# Patient Record
Sex: Female | Born: 1950 | Race: White | Hispanic: No | Marital: Married | Smoking: Never smoker
Health system: Southern US, Community
[De-identification: ages and names within clinical notes are randomized; demographics above are authoritative.]

## PROBLEM LIST (undated history)

## (undated) DIAGNOSIS — C50919 Malignant neoplasm of unspecified site of unspecified female breast: Secondary | ICD-10-CM

## (undated) DIAGNOSIS — M8580 Other specified disorders of bone density and structure, unspecified site: Secondary | ICD-10-CM

## (undated) DIAGNOSIS — E559 Vitamin D deficiency, unspecified: Secondary | ICD-10-CM

## (undated) DIAGNOSIS — Z0000 Encounter for general adult medical examination without abnormal findings: Principal | ICD-10-CM

## (undated) HISTORY — PX: APPENDECTOMY: SHX54

## (undated) HISTORY — DX: Malignant neoplasm of unspecified site of unspecified female breast: C50.919

## (undated) HISTORY — DX: Other specified disorders of bone density and structure, unspecified site: M85.80

## (undated) HISTORY — DX: Vitamin D deficiency, unspecified: E55.9

## (undated) HISTORY — DX: Encounter for general adult medical examination without abnormal findings: Z00.00

---

## 1998-09-04 DIAGNOSIS — C50919 Malignant neoplasm of unspecified site of unspecified female breast: Secondary | ICD-10-CM

## 1998-09-04 HISTORY — PX: BREAST SURGERY: SHX581

## 1998-09-04 HISTORY — DX: Malignant neoplasm of unspecified site of unspecified female breast: C50.919

## 1998-09-04 HISTORY — PX: BREAST BIOPSY: SHX20

## 1998-09-04 HISTORY — PX: BREAST LUMPECTOMY: SHX2

## 1998-09-04 HISTORY — PX: LYMPH NODE DISSECTION: SHX5087

## 1999-01-18 ENCOUNTER — Other Ambulatory Visit: Admission: RE | Admit: 1999-01-18 | Discharge: 1999-01-18 | Payer: Self-pay | Admitting: Radiology

## 1999-04-15 ENCOUNTER — Encounter: Admission: RE | Admit: 1999-04-15 | Discharge: 1999-07-14 | Payer: Self-pay | Admitting: *Deleted

## 1999-07-19 ENCOUNTER — Other Ambulatory Visit: Admission: RE | Admit: 1999-07-19 | Discharge: 1999-07-19 | Payer: Self-pay | Admitting: Obstetrics and Gynecology

## 1999-09-05 HISTORY — PX: ABDOMINAL HYSTERECTOMY: SHX81

## 2000-01-17 ENCOUNTER — Other Ambulatory Visit: Admission: RE | Admit: 2000-01-17 | Discharge: 2000-01-17 | Payer: Self-pay | Admitting: Obstetrics and Gynecology

## 2000-06-18 ENCOUNTER — Observation Stay (HOSPITAL_COMMUNITY): Admission: RE | Admit: 2000-06-18 | Discharge: 2000-06-18 | Payer: Self-pay | Admitting: *Deleted

## 2000-06-18 ENCOUNTER — Encounter (INDEPENDENT_AMBULATORY_CARE_PROVIDER_SITE_OTHER): Payer: Self-pay

## 2001-03-21 ENCOUNTER — Other Ambulatory Visit: Admission: RE | Admit: 2001-03-21 | Discharge: 2001-03-21 | Payer: Self-pay | Admitting: *Deleted

## 2002-03-24 ENCOUNTER — Other Ambulatory Visit: Admission: RE | Admit: 2002-03-24 | Discharge: 2002-03-24 | Payer: Self-pay | Admitting: *Deleted

## 2003-03-26 ENCOUNTER — Other Ambulatory Visit: Admission: RE | Admit: 2003-03-26 | Discharge: 2003-03-26 | Payer: Self-pay | Admitting: *Deleted

## 2003-06-12 ENCOUNTER — Ambulatory Visit (HOSPITAL_COMMUNITY): Admission: RE | Admit: 2003-06-12 | Discharge: 2003-06-12 | Payer: Self-pay | Admitting: Gastroenterology

## 2004-07-10 ENCOUNTER — Ambulatory Visit: Payer: Self-pay | Admitting: Oncology

## 2005-02-03 ENCOUNTER — Ambulatory Visit: Payer: Self-pay | Admitting: Oncology

## 2005-05-03 ENCOUNTER — Encounter: Admission: RE | Admit: 2005-05-03 | Discharge: 2005-05-03 | Payer: Self-pay | Admitting: Family Medicine

## 2005-09-08 ENCOUNTER — Ambulatory Visit: Payer: Self-pay | Admitting: Oncology

## 2006-09-05 ENCOUNTER — Ambulatory Visit: Payer: Self-pay | Admitting: Oncology

## 2007-09-04 ENCOUNTER — Ambulatory Visit: Payer: Self-pay | Admitting: Oncology

## 2008-09-03 ENCOUNTER — Ambulatory Visit: Payer: Self-pay | Admitting: Oncology

## 2009-03-11 ENCOUNTER — Ambulatory Visit: Payer: Self-pay | Admitting: Oncology

## 2010-02-11 ENCOUNTER — Ambulatory Visit: Payer: Self-pay | Admitting: Oncology

## 2011-01-20 NOTE — Op Note (Signed)
[name], [name]                        ACCOUNT NO.:  [id number]   MEDICAL RECORD NO.:  [id number]                   PATIENT TYPE:  AMB   LOCATION:  ENDO                                 FACILITY:  [facility]   PHYSICIAN:  Petra Kuba, M.D.                 DATE OF BIRTH:  July 28, 1951   DATE OF PROCEDURE:  06/12/2003  DATE OF DISCHARGE:                                 OPERATIVE REPORT   PROCEDURE:  Colonoscopy.   INDICATIONS:  Patient with breast cancer at a young age, due for colonic  screening.  Consent was signed after risks, benefits, methods, and options  thoroughly discussed in the office.   MEDICINES USED:  Demerol 100 mg, Versed 10 mg.   DESCRIPTION OF PROCEDURE:  Rectal inspection was pertinent for external  hemorrhoids.  Digital exam was negative.  The video pediatric adjustable  colonoscope was inserted fairly easily despite a very tortuous colon and  advanced to the cecum.  This did require abdominal pressure and no position  changes.  No obvious abnormality was seen on insertion, and the cecum was  identified by the appendiceal orifice and the ileocecal valve. In fact, the  scope was inserted a short way into the terminal ileum, which was normal.  Photo documentation was obtained.  The scope was slowly withdrawn.  The prep  was adequate.  There was some liquid stool that required washing and  suctioning.  On slow withdrawal back to the rectum no polypoid lesions,  masses, diverticula, or other abnormalities were seen.  When we fell back  around a tortuous loop, we did try to readvanced around the curve to  decrease chances of missing things, but none were seen.  Anorectal pull-  through and retroflexion confirmed some small hemorrhoids.  The scope was  inserted a short way up the left side of the colon, air was suctioned, and  the scope removed.  The patient tolerated the procedure well.  There was no  obvious immediate complication.   ENDOSCOPIC DIAGNOSES:  1.  Internal-external hemorrhoids.  2. Tortuous colon.  3. Otherwise within normal limits to the terminal ileum.   PLAN:  Yearly rectals and guaiacs per gynecology.  Happy to see back p.r.n.  Repeat screening in five to 10 years.  Consider a one-time x-ray, either a  barium enema or virtual colonoscopy, at that juncture if available, or happy  to repeat colonoscopy.                                               Petra Kuba, M.D.    MEM/MEDQ  D:  06/12/2003  T:  06/13/2003  Job:  [id number]   cc:   Pershing Cox, M.D.  [address]  [address]  [place]  [place]  [address]  Fax: [phone]

## 2011-02-28 ENCOUNTER — Encounter (HOSPITAL_BASED_OUTPATIENT_CLINIC_OR_DEPARTMENT_OTHER): Payer: Managed Care, Other (non HMO) | Admitting: Oncology

## 2011-02-28 DIAGNOSIS — Z853 Personal history of malignant neoplasm of breast: Secondary | ICD-10-CM

## 2011-10-10 ENCOUNTER — Telehealth: Payer: Self-pay | Admitting: Oncology

## 2011-10-10 NOTE — Telephone Encounter (Signed)
lmonvm adviisng the pt of her June 2013 appt along with the mammo appt [facility]

## 2012-02-27 ENCOUNTER — Telehealth: Payer: Self-pay | Admitting: Oncology

## 2012-02-27 ENCOUNTER — Ambulatory Visit (HOSPITAL_BASED_OUTPATIENT_CLINIC_OR_DEPARTMENT_OTHER): Payer: Managed Care, Other (non HMO) | Admitting: Oncology

## 2012-02-27 VITALS — BP 114/67 | HR 86 | Temp 97.6°F | Ht 66.0 in | Wt 138.8 lb

## 2012-02-27 DIAGNOSIS — Z853 Personal history of malignant neoplasm of breast: Secondary | ICD-10-CM

## 2012-02-27 DIAGNOSIS — C50919 Malignant neoplasm of unspecified site of unspecified female breast: Secondary | ICD-10-CM

## 2012-02-27 DIAGNOSIS — M899 Disorder of bone, unspecified: Secondary | ICD-10-CM

## 2012-02-27 DIAGNOSIS — M949 Disorder of cartilage, unspecified: Secondary | ICD-10-CM

## 2012-02-27 NOTE — Progress Notes (Signed)
   [facility]    OFFICE PROGRESS NOTE   INTERVAL HISTORY:   She returns as scheduled. No complaint.   A bilateral mammogram was negative on 02/05/2012.  Objective:  Vital signs in last 24 hours:  Blood pressure 114/67, pulse 86, temperature 97.6 F (36.4 C), temperature source Oral, height 5\' 6"  (1.676 m), weight 138 lb 12.8 oz (62.959 kg).    HEENT: Neck without mass Lymphatics: No cervical, supraclavicular, or axillary nodes Resp: Lungs clear bilaterally Cardio: Regular rate and rhythm GI: No hepatomegaly Vascular: No leg edema Breasts: Status post right lumpectomy. No evidence for local tumor recurrence. No mass in either breast the     Medications: I have reviewed the patient's current medications.  Assessment/Plan: 1. 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 2. Osteopenia -not taking calcium or vitamin D. She reports drinking milk and exercising.   Disposition:  She remains in remission from breast cancer. She would like to continue followup at the cancer Center. She will return for an office visit in one year.  Thornton Papas, MD  02/27/2012  3:15 PM

## 2012-02-27 NOTE — Telephone Encounter (Signed)
Gave pt appt for June 2013 MD only,pt will have Bone density @ [facility] on 02/06/13 with mammogram, faxed order to [facility]

## 2013-02-25 ENCOUNTER — Telehealth: Payer: Self-pay | Admitting: Oncology

## 2013-02-25 ENCOUNTER — Ambulatory Visit (HOSPITAL_BASED_OUTPATIENT_CLINIC_OR_DEPARTMENT_OTHER): Payer: BC Managed Care – PPO | Admitting: Oncology

## 2013-02-25 VITALS — BP 101/60 | HR 74 | Temp 98.2°F | Resp 20 | Ht 66.0 in | Wt 138.0 lb

## 2013-02-25 DIAGNOSIS — C50919 Malignant neoplasm of unspecified site of unspecified female breast: Secondary | ICD-10-CM

## 2013-02-25 NOTE — Progress Notes (Signed)
   [facility]    OFFICE PROGRESS NOTE   INTERVAL HISTORY:   She returns as scheduled. She is fatigued after returning from [place] yesterday. No specific complaint. She had a mammogram earlier this month (we have not received the report).  Objective:  Vital signs in last 24 hours:  Blood pressure 101/60, pulse 74, temperature 98.2 F (36.8 C), temperature source Oral, resp. rate 20, height 5\' 6"  (1.676 m), weight 138 lb (62.596 kg).    HEENT: Neck without mass Lymphatics: No cervical, supraclavicular, or axillary nodes Resp: Lungs clear bilaterally Cardio: Regular rate and rhythm GI: No hepatomegaly Vascular: No leg edema Breast: Status post right lumpectomy. No evidence for local tumor recurrence. No mass in either breast.    Medications: I have reviewed the patient's current medications.  Assessment/Plan: 1. 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 2. Osteopenia -not taking calcium or vitamin D. She reports drinking milk and exercising.   Disposition:  She remains in clinical remission from breast cancer. We will followup on the June 2014 mammogram. She will return for an office visit in one year.   Thornton Papas, MD  02/25/2013  4:45 PM

## 2013-03-05 ENCOUNTER — Encounter: Payer: Self-pay | Admitting: Oncology

## 2013-07-10 ENCOUNTER — Other Ambulatory Visit: Payer: Self-pay

## 2013-10-14 ENCOUNTER — Encounter: Payer: Self-pay | Admitting: Oncology

## 2014-02-26 ENCOUNTER — Telehealth: Payer: Self-pay | Admitting: Oncology

## 2014-02-26 ENCOUNTER — Ambulatory Visit (HOSPITAL_BASED_OUTPATIENT_CLINIC_OR_DEPARTMENT_OTHER): Payer: BC Managed Care – PPO | Admitting: Oncology

## 2014-02-26 VITALS — BP 105/67 | HR 79 | Temp 98.4°F | Resp 18 | Ht 66.0 in | Wt 144.5 lb

## 2014-02-26 DIAGNOSIS — C50919 Malignant neoplasm of unspecified site of unspecified female breast: Secondary | ICD-10-CM

## 2014-02-26 DIAGNOSIS — M8580 Other specified disorders of bone density and structure, unspecified site: Secondary | ICD-10-CM

## 2014-02-26 DIAGNOSIS — M859 Disorder of bone density and structure, unspecified: Secondary | ICD-10-CM

## 2014-02-26 DIAGNOSIS — M949 Disorder of cartilage, unspecified: Secondary | ICD-10-CM

## 2014-02-26 DIAGNOSIS — Z853 Personal history of malignant neoplasm of breast: Secondary | ICD-10-CM

## 2014-02-26 DIAGNOSIS — M899 Disorder of bone, unspecified: Secondary | ICD-10-CM

## 2014-02-26 NOTE — Telephone Encounter (Signed)
Gave pt appt for Md for 2016, [employer] they put pty on thier call list for mammogram and Bone density for next year, pt aware

## 2014-02-26 NOTE — Progress Notes (Signed)
  [facility] OFFICE PROGRESS NOTE   Diagnosis: Breast cancer  INTERVAL HISTORY:   She returns as scheduled. She feels well. No palpable change at either breast. A bilateral mammogram 02/09/2014 was negative.  Objective:  Vital signs in last 24 hours:  Blood pressure 105/67, pulse 79, temperature 98.4 F (36.9 C), temperature source Oral, resp. rate 18, height 5\' 6"  (1.676 m), weight 144 lb 8 oz (65.545 kg), SpO2 99.00%.    HEENT: Neck without mass Lymphatics: No cervical, supra-clavicular, or axillary nodes Resp: Lungs clear bilaterally Cardio: Regular rate and rhythm GI: No hepatomegaly Vascular: No leg edema Breasts: Status post right lumpectomy. No evidence for local tumor recurrence. No mass in either breast.   Medications: I have reviewed the patient's current medications.  Assessment/Plan: 1. 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 2. Osteopenia -not taking calcium or vitamin D.   Disposition:  She remains in clinical remission from breast cancer. She would like to continue followup at the [facility]. She will return for an office visit in one year. We will schedule a mammogram and bone density scan for June of 2016.  Betsy Coder, MD  02/26/2014  4:37 PM

## 2014-06-19 ENCOUNTER — Other Ambulatory Visit: Payer: Self-pay

## 2015-02-26 ENCOUNTER — Ambulatory Visit (HOSPITAL_BASED_OUTPATIENT_CLINIC_OR_DEPARTMENT_OTHER): Payer: BLUE CROSS/BLUE SHIELD | Admitting: Oncology

## 2015-02-26 ENCOUNTER — Telehealth: Payer: Self-pay | Admitting: Oncology

## 2015-02-26 VITALS — BP 115/71 | HR 72 | Temp 98.9°F | Resp 18 | Ht 66.0 in | Wt 149.8 lb

## 2015-02-26 DIAGNOSIS — Z853 Personal history of malignant neoplasm of breast: Secondary | ICD-10-CM

## 2015-02-26 DIAGNOSIS — M8580 Other specified disorders of bone density and structure, unspecified site: Secondary | ICD-10-CM | POA: Diagnosis not present

## 2015-02-26 DIAGNOSIS — C50911 Malignant neoplasm of unspecified site of right female breast: Secondary | ICD-10-CM

## 2015-02-26 NOTE — Telephone Encounter (Signed)
per pof to sch pt appt-cld to sch mamma & DEXA-per [facility] books not opened that far out-stated they will send pt a reminder to call and make appt-gave pt copy of avs

## 2015-02-26 NOTE — Progress Notes (Signed)
  [facility] OFFICE PROGRESS NOTE   Diagnosis: Breast cancer  INTERVAL HISTORY:   She returns as scheduled. She feels well. No change over either breast. No complaint. A bone density scan on 02/12/2015 was unchanged compared to June 2014. She is exercising, but does not take calcium.  Objective:  Vital signs in last 24 hours:  Blood pressure 115/71, pulse 72, temperature 98.9 F (37.2 C), temperature source Oral, resp. rate 18, height 5\' 6"  (1.676 m), weight 149 lb 12.8 oz (67.949 kg), SpO2 100 %.    HEENT: Neck without mass Lymphatics: No cervical, supraclavicular, or axillary nodes Resp: Lungs clear bilaterally Cardio: Regular rate and rhythm GI: No hepatomegaly Vascular: No leg edema Breasts: Status post right lumpectomy. No evidence for local tumor recurrence. No mass in either breast. 4-5 millimeter soft mobile oval cutaneous nodule at the medial right breast.     Medications: I have reviewed the patient's current medications.  Assessment/Plan: 1. 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 2. Osteopenia -not taking calcium or vitamin D. Stable bone density scan 02/12/2015    Disposition:  [name] remains in clinical remission from breast cancer. She would like to continue follow-up at the [facility]. She will be scheduled for a mammogram in June 2017. She will return for an office visit in one year.  Betsy Coder, MD  02/26/2015  10:43 AM

## 2015-09-22 ENCOUNTER — Telehealth: Payer: Self-pay | Admitting: Oncology

## 2015-09-22 NOTE — Telephone Encounter (Signed)
pt cld to r/s appt to [facility]-gave pt time & date of r/s appt

## 2015-11-09 ENCOUNTER — Encounter: Payer: Self-pay | Admitting: Family Medicine

## 2015-11-09 ENCOUNTER — Ambulatory Visit (INDEPENDENT_AMBULATORY_CARE_PROVIDER_SITE_OTHER): Payer: 59 | Admitting: Family Medicine

## 2015-11-09 ENCOUNTER — Ambulatory Visit: Payer: BLUE CROSS/BLUE SHIELD | Admitting: Family Medicine

## 2015-11-09 VITALS — BP 120/76 | HR 84 | Temp 97.9°F | Ht 66.0 in | Wt 151.1 lb

## 2015-11-09 DIAGNOSIS — C50911 Malignant neoplasm of unspecified site of right female breast: Secondary | ICD-10-CM

## 2015-11-09 DIAGNOSIS — E559 Vitamin D deficiency, unspecified: Secondary | ICD-10-CM

## 2015-11-09 DIAGNOSIS — M8580 Other specified disorders of bone density and structure, unspecified site: Secondary | ICD-10-CM | POA: Diagnosis not present

## 2015-11-09 LAB — VITAMIN D 25 HYDROXY (VIT D DEFICIENCY, FRACTURES): VITD: 26.61 ng/mL — ABNORMAL LOW (ref 30.00–100.00)

## 2015-11-09 NOTE — Progress Notes (Signed)
Pre visit review using our clinic review tool, if applicable. No additional management support is needed unless otherwise documented below in the visit note. 

## 2015-11-09 NOTE — Patient Instructions (Signed)
Preventive Care for Adults, Female A healthy lifestyle and preventive care can promote health and wellness. Preventive health guidelines for women include the following key practices.  A routine yearly physical is a good way to check with your health care provider about your health and preventive screening. It is a chance to share any concerns and updates on your health and to receive a thorough exam.  Visit your dentist for a routine exam and preventive care every 6 months. Brush your teeth twice a day and floss once a day. Good oral hygiene prevents tooth decay and gum disease.  The frequency of eye exams is based on your age, health, family medical history, use of contact lenses, and other factors. Follow your health care provider's recommendations for frequency of eye exams.  Eat a healthy diet. Foods like vegetables, fruits, whole grains, low-fat dairy products, and lean protein foods contain the nutrients you need without too many calories. Decrease your intake of foods high in solid fats, added sugars, and salt. Eat the right amount of calories for you.Get information about a proper diet from your health care provider, if necessary.  Regular physical exercise is one of the most important things you can do for your health. Most adults should get at least 150 minutes of moderate-intensity exercise (any activity that increases your heart rate and causes you to sweat) each week. In addition, most adults need muscle-strengthening exercises on 2 or more days a week.  Maintain a healthy weight. The body mass index (BMI) is a screening tool to identify possible weight problems. It provides an estimate of body fat based on height and weight. Your health care provider can find your BMI and can help you achieve or maintain a healthy weight.For adults 20 years and older:  A BMI below 18.5 is considered underweight.  A BMI of 18.5 to 24.9 is normal.  A BMI of 25 to 29.9 is considered overweight.  A  BMI of 30 and above is considered obese.  Maintain normal blood lipids and cholesterol levels by exercising and minimizing your intake of saturated fat. Eat a balanced diet with plenty of fruit and vegetables. Blood tests for lipids and cholesterol should begin at age 45 and be repeated every 5 years. If your lipid or cholesterol levels are high, you are over 50, or you are at high risk for heart disease, you may need your cholesterol levels checked more frequently.Ongoing high lipid and cholesterol levels should be treated with medicines if diet and exercise are not working.  If you smoke, find out from your health care provider how to quit. If you do not use tobacco, do not start.  Lung cancer screening is recommended for adults aged 45-80 years who are at high risk for developing lung cancer because of a history of smoking. A yearly low-dose CT scan of the lungs is recommended for people who have at least a 30-pack-year history of smoking and are a current smoker or have quit within the past 15 years. A pack year of smoking is smoking an average of 1 pack of cigarettes a day for 1 year (for example: 1 pack a day for 30 years or 2 packs a day for 15 years). Yearly screening should continue until the smoker has stopped smoking for at least 15 years. Yearly screening should be stopped for people who develop a health problem that would prevent them from having lung cancer treatment.  If you are pregnant, do not drink alcohol. If you are  breastfeeding, be very cautious about drinking alcohol. If you are not pregnant and choose to drink alcohol, do not have more than 1 drink per day. One drink is considered to be 12 ounces (355 mL) of beer, 5 ounces (148 mL) of wine, or 1.5 ounces (44 mL) of liquor.  Avoid use of street drugs. Do not share needles with anyone. Ask for help if you need support or instructions about stopping the use of drugs.  High blood pressure causes heart disease and increases the risk  of stroke. Your blood pressure should be checked at least every 1 to 2 years. Ongoing high blood pressure should be treated with medicines if weight loss and exercise do not work.  If you are 55-79 years old, ask your health care provider if you should take aspirin to prevent strokes.  Diabetes screening is done by taking a blood sample to check your blood glucose level after you have not eaten for a certain period of time (fasting). If you are not overweight and you do not have risk factors for diabetes, you should be screened once every 3 years starting at age 45. If you are overweight or obese and you are 40-70 years of age, you should be screened for diabetes every year as part of your cardiovascular risk assessment.  Breast cancer screening is essential preventive care for women. You should practice "breast self-awareness." This means understanding the normal appearance and feel of your breasts and may include breast self-examination. Any changes detected, no matter how small, should be reported to a health care provider. Women in their 20s and 30s should have a clinical breast exam (CBE) by a health care provider as part of a regular health exam every 1 to 3 years. After age 40, women should have a CBE every year. Starting at age 40, women should consider having a mammogram (breast X-ray test) every year. Women who have a family history of breast cancer should talk to their health care provider about genetic screening. Women at a high risk of breast cancer should talk to their health care providers about having an MRI and a mammogram every year.  Breast cancer gene (BRCA)-related cancer risk assessment is recommended for women who have family members with BRCA-related cancers. BRCA-related cancers include breast, ovarian, tubal, and peritoneal cancers. Having family members with these cancers may be associated with an increased risk for harmful changes (mutations) in the breast cancer genes BRCA1 and  BRCA2. Results of the assessment will determine the need for genetic counseling and BRCA1 and BRCA2 testing.  Your health care provider may recommend that you be screened regularly for cancer of the pelvic organs (ovaries, uterus, and vagina). This screening involves a pelvic examination, including checking for microscopic changes to the surface of your cervix (Pap test). You may be encouraged to have this screening done every 3 years, beginning at age 21.  For women ages 30-65, health care providers may recommend pelvic exams and Pap testing every 3 years, or they may recommend the Pap and pelvic exam, combined with testing for human papilloma virus (HPV), every 5 years. Some types of HPV increase your risk of cervical cancer. Testing for HPV may also be done on women of any age with unclear Pap test results.  Other health care providers may not recommend any screening for nonpregnant women who are considered low risk for pelvic cancer and who do not have symptoms. Ask your health care provider if a screening pelvic exam is right for   you.  If you have had past treatment for cervical cancer or a condition that could lead to cancer, you need Pap tests and screening for cancer for at least 20 years after your treatment. If Pap tests have been discontinued, your risk factors (such as having a new sexual partner) need to be reassessed to determine if screening should resume. Some women have medical problems that increase the chance of getting cervical cancer. In these cases, your health care provider may recommend more frequent screening and Pap tests.  Colorectal cancer can be detected and often prevented. Most routine colorectal cancer screening begins at the age of 50 years and continues through age 75 years. However, your health care provider may recommend screening at an earlier age if you have risk factors for colon cancer. On a yearly basis, your health care provider may provide home test kits to check  for hidden blood in the stool. Use of a small camera at the end of a tube, to directly examine the colon (sigmoidoscopy or colonoscopy), can detect the earliest forms of colorectal cancer. Talk to your health care provider about this at age 50, when routine screening begins. Direct exam of the colon should be repeated every 5-10 years through age 75 years, unless early forms of precancerous polyps or small growths are found.  People who are at an increased risk for hepatitis B should be screened for this virus. You are considered at high risk for hepatitis B if:  You were born in a country where hepatitis B occurs often. Talk with your health care provider about which countries are considered high risk.  Your parents were born in a high-risk country and you have not received a shot to protect against hepatitis B (hepatitis B vaccine).  You have HIV or AIDS.  You use needles to inject street drugs.  You live with, or have sex with, someone who has hepatitis B.  You get hemodialysis treatment.  You take certain medicines for conditions like cancer, organ transplantation, and autoimmune conditions.  Hepatitis C blood testing is recommended for all people born from 1945 through 1965 and any individual with known risks for hepatitis C.  Practice safe sex. Use condoms and avoid high-risk sexual practices to reduce the spread of sexually transmitted infections (STIs). STIs include gonorrhea, chlamydia, syphilis, trichomonas, herpes, HPV, and human immunodeficiency virus (HIV). Herpes, HIV, and HPV are viral illnesses that have no cure. They can result in disability, cancer, and death.  You should be screened for sexually transmitted illnesses (STIs) including gonorrhea and chlamydia if:  You are sexually active and are younger than 24 years.  You are older than 24 years and your health care provider tells you that you are at risk for this type of infection.  Your sexual activity has changed  since you were last screened and you are at an increased risk for chlamydia or gonorrhea. Ask your health care provider if you are at risk.  If you are at risk of being infected with HIV, it is recommended that you take a prescription medicine daily to prevent HIV infection. This is called preexposure prophylaxis (PrEP). You are considered at risk if:  You are sexually active and do not regularly use condoms or know the HIV status of your partner(s).  You take drugs by injection.  You are sexually active with a partner who has HIV.  Talk with your health care provider about whether you are at high risk of being infected with HIV. If   you choose to begin PrEP, you should first be tested for HIV. You should then be tested every 3 months for as long as you are taking PrEP.  Osteoporosis is a disease in which the bones lose minerals and strength with aging. This can result in serious bone fractures or breaks. The risk of osteoporosis can be identified using a bone density scan. Women ages 67 years and over and women at risk for fractures or osteoporosis should discuss screening with their health care providers. Ask your health care provider whether you should take a calcium supplement or vitamin D to reduce the rate of osteoporosis.  Menopause can be associated with physical symptoms and risks. Hormone replacement therapy is available to decrease symptoms and risks. You should talk to your health care provider about whether hormone replacement therapy is right for you.  Use sunscreen. Apply sunscreen liberally and repeatedly throughout the day. You should seek shade when your shadow is shorter than you. Protect yourself by wearing long sleeves, pants, a wide-brimmed hat, and sunglasses year round, whenever you are outdoors.  Once a month, do a whole body skin exam, using a mirror to look at the skin on your back. Tell your health care provider of new moles, moles that have irregular borders, moles that  are larger than a pencil eraser, or moles that have changed in shape or color.  Stay current with required vaccines (immunizations).  Influenza vaccine. All adults should be immunized every year.  Tetanus, diphtheria, and acellular pertussis (Td, Tdap) vaccine. Pregnant women should receive 1 dose of Tdap vaccine during each pregnancy. The dose should be obtained regardless of the length of time since the last dose. Immunization is preferred during the 27th-36th week of gestation. An adult who has not previously received Tdap or who does not know her vaccine status should receive 1 dose of Tdap. This initial dose should be followed by tetanus and diphtheria toxoids (Td) booster doses every 10 years. Adults with an unknown or incomplete history of completing a 3-dose immunization series with Td-containing vaccines should begin or complete a primary immunization series including a Tdap dose. Adults should receive a Td booster every 10 years.  Varicella vaccine. An adult without evidence of immunity to varicella should receive 2 doses or a second dose if she has previously received 1 dose. Pregnant females who do not have evidence of immunity should receive the first dose after pregnancy. This first dose should be obtained before leaving the health care facility. The second dose should be obtained 4-8 weeks after the first dose.  Human papillomavirus (HPV) vaccine. Females aged 13-26 years who have not received the vaccine previously should obtain the 3-dose series. The vaccine is not recommended for use in pregnant females. However, pregnancy testing is not needed before receiving a dose. If a female is found to be pregnant after receiving a dose, no treatment is needed. In that case, the remaining doses should be delayed until after the pregnancy. Immunization is recommended for any person with an immunocompromised condition through the age of 61 years if she did not get any or all doses earlier. During the  3-dose series, the second dose should be obtained 4-8 weeks after the first dose. The third dose should be obtained 24 weeks after the first dose and 16 weeks after the second dose.  Zoster vaccine. One dose is recommended for adults aged 30 years or older unless certain conditions are present.  Measles, mumps, and rubella (MMR) vaccine. Adults born  before 1957 generally are considered immune to measles and mumps. Adults born in 1957 or later should have 1 or more doses of MMR vaccine unless there is a contraindication to the vaccine or there is laboratory evidence of immunity to each of the three diseases. A routine second dose of MMR vaccine should be obtained at least 28 days after the first dose for students attending postsecondary schools, health care workers, or international travelers. People who received inactivated measles vaccine or an unknown type of measles vaccine during 1963-1967 should receive 2 doses of MMR vaccine. People who received inactivated mumps vaccine or an unknown type of mumps vaccine before 1979 and are at high risk for mumps infection should consider immunization with 2 doses of MMR vaccine. For females of childbearing age, rubella immunity should be determined. If there is no evidence of immunity, females who are not pregnant should be vaccinated. If there is no evidence of immunity, females who are pregnant should delay immunization until after pregnancy. Unvaccinated health care workers born before 1957 who lack laboratory evidence of measles, mumps, or rubella immunity or laboratory confirmation of disease should consider measles and mumps immunization with 2 doses of MMR vaccine or rubella immunization with 1 dose of MMR vaccine.  Pneumococcal 13-valent conjugate (PCV13) vaccine. When indicated, a person who is uncertain of his immunization history and has no record of immunization should receive the PCV13 vaccine. All adults 65 years of age and older should receive this  vaccine. An adult aged 19 years or older who has certain medical conditions and has not been previously immunized should receive 1 dose of PCV13 vaccine. This PCV13 should be followed with a dose of pneumococcal polysaccharide (PPSV23) vaccine. Adults who are at high risk for pneumococcal disease should obtain the PPSV23 vaccine at least 8 weeks after the dose of PCV13 vaccine. Adults older than 63 years of age who have normal immune system function should obtain the PPSV23 vaccine dose at least 1 year after the dose of PCV13 vaccine.  Pneumococcal polysaccharide (PPSV23) vaccine. When PCV13 is also indicated, PCV13 should be obtained first. All adults aged 65 years and older should be immunized. An adult younger than age 65 years who has certain medical conditions should be immunized. Any person who resides in a nursing home or long-term care facility should be immunized. An adult smoker should be immunized. People with an immunocompromised condition and certain other conditions should receive both PCV13 and PPSV23 vaccines. People with human immunodeficiency virus (HIV) infection should be immunized as soon as possible after diagnosis. Immunization during chemotherapy or radiation therapy should be avoided. Routine use of PPSV23 vaccine is not recommended for American Indians, [place] Natives, or people younger than 65 years unless there are medical conditions that require PPSV23 vaccine. When indicated, people who have unknown immunization and have no record of immunization should receive PPSV23 vaccine. One-time revaccination 5 years after the first dose of PPSV23 is recommended for people aged 19-64 years who have chronic kidney failure, nephrotic syndrome, asplenia, or immunocompromised conditions. People who received 1-2 doses of PPSV23 before age 65 years should receive another dose of PPSV23 vaccine at age 65 years or later if at least 5 years have passed since the previous dose. Doses of PPSV23 are not  needed for people immunized with PPSV23 at or after age 65 years.  Meningococcal vaccine. Adults with asplenia or persistent complement component deficiencies should receive 2 doses of quadrivalent meningococcal conjugate (MenACWY-D) vaccine. The doses should be obtained   at least 2 months apart. Microbiologists working with certain meningococcal bacteria, [place] recruits, people at risk during an outbreak, and people who travel to or live in countries with a high rate of meningitis should be immunized. A first-year college student up through age 34 years who is living in a residence hall should receive a dose if she did not receive a dose on or after her 16th birthday. Adults who have certain high-risk conditions should receive one or more doses of vaccine.  Hepatitis A vaccine. Adults who wish to be protected from this disease, have certain high-risk conditions, work with hepatitis A-infected animals, work in hepatitis A research labs, or travel to or work in countries with a high rate of hepatitis A should be immunized. Adults who were previously unvaccinated and who anticipate close contact with an international adoptee during the first 60 days after arrival in the [place] from a country with a high rate of hepatitis A should be immunized.  Hepatitis B vaccine. Adults who wish to be protected from this disease, have certain high-risk conditions, may be exposed to blood or other infectious body fluids, are household contacts or sex partners of hepatitis B positive people, are clients or workers in certain care facilities, or travel to or work in countries with a high rate of hepatitis B should be immunized.  Haemophilus influenzae type b (Hib) vaccine. A previously unvaccinated person with asplenia or sickle cell disease or having a scheduled splenectomy should receive 1 dose of Hib vaccine. Regardless of previous immunization, a recipient of a hematopoietic stem cell transplant should receive a  3-dose series 6-12 months after her successful transplant. Hib vaccine is not recommended for adults with HIV infection. Preventive Services / Frequency Ages 35 to 4 years  Blood pressure check.** / Every 3-5 years.  Lipid and cholesterol check.** / Every 5 years beginning at age 60.  Clinical breast exam.** / Every 3 years for women in their 71s and 10s.  BRCA-related cancer risk assessment.** / For women who have family members with a BRCA-related cancer (breast, ovarian, tubal, or peritoneal cancers).  Pap test.** / Every 2 years from ages 76 through 26. Every 3 years starting at age 61 through age 76 or 93 with a history of 3 consecutive normal Pap tests.  HPV screening.** / Every 3 years from ages 37 through ages 60 to 51 with a history of 3 consecutive normal Pap tests.  Hepatitis C blood test.** / For any individual with known risks for hepatitis C.  Skin self-exam. / Monthly.  Influenza vaccine. / Every year.  Tetanus, diphtheria, and acellular pertussis (Tdap, Td) vaccine.** / Consult your health care provider. Pregnant women should receive 1 dose of Tdap vaccine during each pregnancy. 1 dose of Td every 10 years.  Varicella vaccine.** / Consult your health care provider. Pregnant females who do not have evidence of immunity should receive the first dose after pregnancy.  HPV vaccine. / 3 doses over 6 months, if 93 and younger. The vaccine is not recommended for use in pregnant females. However, pregnancy testing is not needed before receiving a dose.  Measles, mumps, rubella (MMR) vaccine.** / You need at least 1 dose of MMR if you were born in 1957 or later. You may also need a 2nd dose. For females of childbearing age, rubella immunity should be determined. If there is no evidence of immunity, females who are not pregnant should be vaccinated. If there is no evidence of immunity, females who are  pregnant should delay immunization until after pregnancy.  Pneumococcal  13-valent conjugate (PCV13) vaccine.** / Consult your health care provider.  Pneumococcal polysaccharide (PPSV23) vaccine.** / 1 to 2 doses if you smoke cigarettes or if you have certain conditions.  Meningococcal vaccine.** / 1 dose if you are age 68 to 8 years and a Market researcher living in a residence hall, or have one of several medical conditions, you need to get vaccinated against meningococcal disease. You may also need additional booster doses.  Hepatitis A vaccine.** / Consult your health care provider.  Hepatitis B vaccine.** / Consult your health care provider.  Haemophilus influenzae type b (Hib) vaccine.** / Consult your health care provider. Ages 7 to 53 years  Blood pressure check.** / Every year.  Lipid and cholesterol check.** / Every 5 years beginning at age 25 years.  Lung cancer screening. / Every year if you are aged 11-80 years and have a 30-pack-year history of smoking and currently smoke or have quit within the past 15 years. Yearly screening is stopped once you have quit smoking for at least 15 years or develop a health problem that would prevent you from having lung cancer treatment.  Clinical breast exam.** / Every year after age 48 years.  BRCA-related cancer risk assessment.** / For women who have family members with a BRCA-related cancer (breast, ovarian, tubal, or peritoneal cancers).  Mammogram.** / Every year beginning at age 41 years and continuing for as long as you are in good health. Consult with your health care provider.  Pap test.** / Every 3 years starting at age 65 years through age 37 or 70 years with a history of 3 consecutive normal Pap tests.  HPV screening.** / Every 3 years from ages 72 years through ages 60 to 40 years with a history of 3 consecutive normal Pap tests.  Fecal occult blood test (FOBT) of stool. / Every year beginning at age 21 years and continuing until age 5 years. You may not need to do this test if you get  a colonoscopy every 10 years.  Flexible sigmoidoscopy or colonoscopy.** / Every 5 years for a flexible sigmoidoscopy or every 10 years for a colonoscopy beginning at age 35 years and continuing until age 48 years.  Hepatitis C blood test.** / For all people born from [birth year] through 1965 and any individual with known risks for hepatitis C.  Skin self-exam. / Monthly.  Influenza vaccine. / Every year.  Tetanus, diphtheria, and acellular pertussis (Tdap/Td) vaccine.** / Consult your health care provider. Pregnant women should receive 1 dose of Tdap vaccine during each pregnancy. 1 dose of Td every 10 years.  Varicella vaccine.** / Consult your health care provider. Pregnant females who do not have evidence of immunity should receive the first dose after pregnancy.  Zoster vaccine.** / 1 dose for adults aged 30 years or older.  Measles, mumps, rubella (MMR) vaccine.** / You need at least 1 dose of MMR if you were born in 1957 or later. You may also need a second dose. For females of childbearing age, rubella immunity should be determined. If there is no evidence of immunity, females who are not pregnant should be vaccinated. If there is no evidence of immunity, females who are pregnant should delay immunization until after pregnancy.  Pneumococcal 13-valent conjugate (PCV13) vaccine.** / Consult your health care provider.  Pneumococcal polysaccharide (PPSV23) vaccine.** / 1 to 2 doses if you smoke cigarettes or if you have certain conditions.  Meningococcal vaccine.** /  Consult your health care provider.  Hepatitis A vaccine.** / Consult your health care provider.  Hepatitis B vaccine.** / Consult your health care provider.  Haemophilus influenzae type b (Hib) vaccine.** / Consult your health care provider. Ages 64 years and over  Blood pressure check.** / Every year.  Lipid and cholesterol check.** / Every 5 years beginning at age 23 years.  Lung cancer screening. / Every year if you  are aged 16-80 years and have a 30-pack-year history of smoking and currently smoke or have quit within the past 15 years. Yearly screening is stopped once you have quit smoking for at least 15 years or develop a health problem that would prevent you from having lung cancer treatment.  Clinical breast exam.** / Every year after age 74 years.  BRCA-related cancer risk assessment.** / For women who have family members with a BRCA-related cancer (breast, ovarian, tubal, or peritoneal cancers).  Mammogram.** / Every year beginning at age 44 years and continuing for as long as you are in good health. Consult with your health care provider.  Pap test.** / Every 3 years starting at age 58 years through age 22 or 39 years with 3 consecutive normal Pap tests. Testing can be stopped between 65 and 70 years with 3 consecutive normal Pap tests and no abnormal Pap or HPV tests in the past 10 years.  HPV screening.** / Every 3 years from ages 64 years through ages 70 or 61 years with a history of 3 consecutive normal Pap tests. Testing can be stopped between 65 and 70 years with 3 consecutive normal Pap tests and no abnormal Pap or HPV tests in the past 10 years.  Fecal occult blood test (FOBT) of stool. / Every year beginning at age 40 years and continuing until age 27 years. You may not need to do this test if you get a colonoscopy every 10 years.  Flexible sigmoidoscopy or colonoscopy.** / Every 5 years for a flexible sigmoidoscopy or every 10 years for a colonoscopy beginning at age 7 years and continuing until age 32 years.  Hepatitis C blood test.** / For all people born from [birth year] through 1965 and any individual with known risks for hepatitis C.  Osteoporosis screening.** / A one-time screening for women ages 30 years and over and women at risk for fractures or osteoporosis.  Skin self-exam. / Monthly.  Influenza vaccine. / Every year.  Tetanus, diphtheria, and acellular pertussis (Tdap/Td)  vaccine.** / 1 dose of Td every 10 years.  Varicella vaccine.** / Consult your health care provider.  Zoster vaccine.** / 1 dose for adults aged 35 years or older.  Pneumococcal 13-valent conjugate (PCV13) vaccine.** / Consult your health care provider.  Pneumococcal polysaccharide (PPSV23) vaccine.** / 1 dose for all adults aged 46 years and older.  Meningococcal vaccine.** / Consult your health care provider.  Hepatitis A vaccine.** / Consult your health care provider.  Hepatitis B vaccine.** / Consult your health care provider.  Haemophilus influenzae type b (Hib) vaccine.** / Consult your health care provider. ** Family history and personal history of risk and conditions may change your health care provider's recommendations.   This information is not intended to replace advice given to you by your health care provider. Make sure you discuss any questions you have with your health care provider.   Document Released: 10/17/2001 Document Revised: 09/11/2014 Document Reviewed: 01/16/2011 Elsevier Interactive Patient Education [employer].

## 2015-11-09 NOTE — Progress Notes (Signed)
Patient ID: Susan Chang, female   DOB: [birth year], 65 y.o.   MRN: [id number]   Subjective:    Patient ID: Susan Chang, female    DOB: 04-22-1951, 65 y.o.   MRN: [id number]  Chief Complaint  Patient presents with  . Establish Care    HPI Patient is in today for new patient appointment. No recent illness or acute concerns. Her past medical history includes right sided Breast cancer, osteopenia and vitamin D deficiency. Denies CP/palp/SOB/HA/congestion/fevers/GI or GU c/o. Taking meds as prescribed  Past Medical History  Diagnosis Date  . Osteopenia   . Vitamin D deficiency 11/14/2015  . Breast cancer ([place]) 2000    right    Past Surgical History  Procedure Laterality Date  . Appendectomy    . Breast surgery  2000    cancer, right lumpectomy  . Abdominal hysterectomy  2001    TAH b/l SPO for ovarian cyst,  . Lymph node dissection Right 2000  . Breast biopsy Right 2000    Family History  Problem Relation Age of Onset  . Cancer Mother     Breast cancer  . Hypertension Mother   . Stroke Mother   . Cancer Father     lung cancer  . Cancer Paternal Aunt     breast cancer    Social History   Social History  . Marital Status: Married    Spouse Name: N/A  . Number of Children: N/A  . Years of Education: N/A   Occupational History  . Production manager    Social History Main Topics  . Smoking status: Never Smoker   . Smokeless tobacco: Not on file  . Alcohol Use: 0.0 oz/week    0 Standard drinks or equivalent per week  . Drug Use: No  . Sexual Activity: Yes     Comment: lives with husband, drafting and design, no dietary restrictions   Other Topics Concern  . Not on file   Social History Narrative    No outpatient prescriptions prior to visit.   No facility-administered medications prior to visit.    No Known Allergies  Review of Systems  Constitutional: Negative for fever, chills and malaise/fatigue.  HENT: Negative for congestion and  hearing loss.   Eyes: Negative for discharge.  Respiratory: Negative for cough, sputum production and shortness of breath.   Cardiovascular: Negative for chest pain, palpitations and leg swelling.  Gastrointestinal: Negative for heartburn, nausea, vomiting, abdominal pain, diarrhea, constipation and blood in stool.  Genitourinary: Negative for dysuria, urgency, frequency and hematuria.  Musculoskeletal: Negative for myalgias, back pain and falls.  Skin: Negative for rash.  Neurological: Negative for dizziness, sensory change, loss of consciousness, weakness and headaches.  Endo/Heme/Allergies: Negative for environmental allergies. Does not bruise/bleed easily.  Psychiatric/Behavioral: Negative for depression and suicidal ideas. The patient is not nervous/anxious and does not have insomnia.        Objective:    Physical Exam  Constitutional: She is oriented to person, place, and time. She appears well-developed and well-nourished. No distress.  HENT:  Head: Normocephalic and atraumatic.  Eyes: Conjunctivae are normal.  Neck: Neck supple. No thyromegaly present.  Cardiovascular: Normal rate, regular rhythm and normal heart sounds.   No murmur heard. Pulmonary/Chest: Effort normal and breath sounds normal. No respiratory distress.  Abdominal: Soft. Bowel sounds are normal. She exhibits no distension and no mass. There is no tenderness.  Musculoskeletal: She exhibits no edema.  Lymphadenopathy:    She has no cervical  adenopathy.  Neurological: She is alert and oriented to person, place, and time.  Skin: Skin is warm and dry.  Psychiatric: She has a normal mood and affect. Her behavior is normal.    BP 120/76 mmHg  Pulse 84  Temp(Src) 97.9 F (36.6 C) (Oral)  Ht _0  (1.676 m)  Wt 151 lb 2 oz (68.55 kg)  BMI 24.40 kg/m2  SpO2 97% Wt Readings from Last 3 Encounters:  11/09/15 151 lb 2 oz (68.55 kg)  02/26/15 149 lb 12.8 oz (67.949 kg)  02/26/14 144 lb 8 oz (65.545 kg)      No results found for: WBC, HGB, HCT, PLT, GLUCOSE, CHOL, TRIG, HDL, LDLDIRECT, LDLCALC, ALT, AST, NA, K, CL, CREATININE, BUN, CO2, TSH, PSA, INR, GLUF, HGBA1C, MICROALBUR  No results found for: TSH No results found for: WBC, HGB, HCT, MCV, PLT No results found for: NA, K, CHLORIDE, CO2, GLUCOSE, BUN, CREATININE, BILITOT, ALKPHOS, AST, ALT, PROT, ALBUMIN, CALCIUM, ANIONGAP, EGFR, GFR No results found for: CHOL No results found for: HDL No results found for: LDLCALC No results found for: TRIG No results found for: CHOLHDL No results found for: HGBA1C     Assessment & Plan:   Problem List Items Addressed This Visit    Breast cancer ([place])    Underwent lumpectomy with radiation and then 5 years of Tamoxifen and has done well since then      Osteopenia - Primary    Encouraged to get adequate exercise, calcium and vitamin d intake      Relevant Orders   VITAMIN D 25 Hydroxy (Vit-D Deficiency, Fractures) (Completed)   Vitamin D deficiency      [name] does not currently have medications on file.  No orders of the defined types were placed in this encounter.     Penni Homans, MD

## 2015-11-10 ENCOUNTER — Other Ambulatory Visit: Payer: Self-pay | Admitting: Family Medicine

## 2015-11-10 MED ORDER — VITAMIN D (ERGOCALCIFEROL) 1.25 MG (50000 UNIT) PO CAPS: 50000.0000 [IU] | ORAL_CAPSULE | ORAL | Status: DC

## 2015-11-14 ENCOUNTER — Encounter: Payer: Self-pay | Admitting: Family Medicine

## 2015-11-14 DIAGNOSIS — C50919 Malignant neoplasm of unspecified site of unspecified female breast: Secondary | ICD-10-CM | POA: Insufficient documentation

## 2015-11-14 DIAGNOSIS — E559 Vitamin D deficiency, unspecified: Secondary | ICD-10-CM

## 2015-11-14 HISTORY — DX: Vitamin D deficiency, unspecified: E55.9

## 2015-11-14 NOTE — Assessment & Plan Note (Signed)
Underwent lumpectomy with radiation and then 5 years of Tamoxifen and has done well since then

## 2015-11-14 NOTE — Assessment & Plan Note (Signed)
Encouraged to get adequate exercise, calcium and vitamin d intake 

## 2015-12-21 LAB — HM MAMMOGRAPHY

## 2015-12-23 ENCOUNTER — Encounter: Payer: Self-pay | Admitting: Family Medicine

## 2016-02-01 ENCOUNTER — Telehealth: Payer: Self-pay | Admitting: Oncology

## 2016-02-01 ENCOUNTER — Ambulatory Visit (HOSPITAL_BASED_OUTPATIENT_CLINIC_OR_DEPARTMENT_OTHER): Payer: 59 | Admitting: Oncology

## 2016-02-01 VITALS — BP 120/68 | HR 77 | Temp 98.5°F | Resp 18 | Ht 66.0 in | Wt 149.9 lb

## 2016-02-01 DIAGNOSIS — M8580 Other specified disorders of bone density and structure, unspecified site: Secondary | ICD-10-CM | POA: Diagnosis not present

## 2016-02-01 DIAGNOSIS — Z853 Personal history of malignant neoplasm of breast: Secondary | ICD-10-CM

## 2016-02-01 DIAGNOSIS — C50911 Malignant neoplasm of unspecified site of right female breast: Secondary | ICD-10-CM

## 2016-02-01 NOTE — Progress Notes (Signed)
  [facility] OFFICE PROGRESS NOTE   Diagnosis:  Breast cancer  INTERVAL HISTORY:   She returns as scheduled. She feels well. No change over either breast. A mammogram on 12/21/2015 was negative.  Objective:  Vital signs in last 24 hours:  Blood pressure 120/68, pulse 77, temperature 98.5 F (36.9 C), temperature source Oral, resp. rate 18, height 5\' 6"  (1.676 m), weight 149 lb 14.4 oz (67.994 kg), SpO2 100 %.    HEENT: Neck without mass Lymphatics: No cervical, supra-clavicular, or axillary nodes Resp: Lungs clear bilaterally Cardio: Regular rate and rhythm GI: No hepatomegaly Vascular: No leg edema Breast: Status post right lumpectomy. No mass in either breast. No evidence for local tumor recurrence.   Medications: I have reviewed the patient's current medications.  Assessment/Plan: 1. 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 2. Osteopenia -not taking calcium or vitamin D. Stable bone density scan 02/12/2015    Disposition:  Susan Chang remains in clinical remission from breast cancer. She would like to [facility] at the [facility]. She will return for an office visit in one year. She will be scheduled for a mammogram in April 2018.  Betsy Coder, MD  02/01/2016  12:34 PM

## 2016-02-01 NOTE — Telephone Encounter (Signed)
Gave pt apt & avs °

## 2016-02-25 ENCOUNTER — Ambulatory Visit: Payer: Self-pay | Admitting: Oncology

## 2016-04-10 ENCOUNTER — Ambulatory Visit: Payer: BLUE CROSS/BLUE SHIELD | Admitting: Family Medicine

## 2016-05-13 ENCOUNTER — Encounter: Payer: Self-pay | Admitting: Family Medicine

## 2016-06-05 ENCOUNTER — Encounter: Payer: 59 | Admitting: Family Medicine

## 2016-06-06 ENCOUNTER — Encounter: Payer: 59 | Admitting: Family Medicine

## 2016-07-13 ENCOUNTER — Encounter: Payer: Self-pay | Admitting: Family Medicine

## 2016-07-13 ENCOUNTER — Ambulatory Visit (INDEPENDENT_AMBULATORY_CARE_PROVIDER_SITE_OTHER): Payer: 59 | Admitting: Family Medicine

## 2016-07-13 VITALS — BP 113/67 | HR 83 | Temp 98.5°F | Ht 66.0 in | Wt 151.2 lb

## 2016-07-13 DIAGNOSIS — L989 Disorder of the skin and subcutaneous tissue, unspecified: Secondary | ICD-10-CM

## 2016-07-13 DIAGNOSIS — M8580 Other specified disorders of bone density and structure, unspecified site: Secondary | ICD-10-CM | POA: Diagnosis not present

## 2016-07-13 DIAGNOSIS — Z0000 Encounter for general adult medical examination without abnormal findings: Secondary | ICD-10-CM

## 2016-07-13 DIAGNOSIS — E559 Vitamin D deficiency, unspecified: Secondary | ICD-10-CM | POA: Diagnosis not present

## 2016-07-13 HISTORY — DX: Encounter for general adult medical examination without abnormal findings: Z00.00

## 2016-07-13 NOTE — Progress Notes (Signed)
Pre visit review using our clinic review tool, if applicable. No additional management support is needed unless otherwise documented below in the visit note. 

## 2016-07-13 NOTE — Patient Instructions (Signed)
Preventive Care for Adults, Female A healthy lifestyle and preventive care can promote health and wellness. Preventive health guidelines for women include the following key practices.  A routine yearly physical is a good way to check with your health care provider about your health and preventive screening. It is a chance to share any concerns and updates on your health and to receive a thorough exam.  Visit your dentist for a routine exam and preventive care every 6 months. Brush your teeth twice a day and floss once a day. Good oral hygiene prevents tooth decay and gum disease.  The frequency of eye exams is based on your age, health, family medical history, use of contact lenses, and other factors. Follow your health care provider's recommendations for frequency of eye exams.  Eat a healthy diet. Foods like vegetables, fruits, whole grains, low-fat dairy products, and lean protein foods contain the nutrients you need without too many calories. Decrease your intake of foods high in solid fats, added sugars, and salt. Eat the right amount of calories for you.Get information about a proper diet from your health care provider, if necessary.  Regular physical exercise is one of the most important things you can do for your health. Most adults should get at least 150 minutes of moderate-intensity exercise (any activity that increases your heart rate and causes you to sweat) each week. In addition, most adults need muscle-strengthening exercises on 2 or more days a week.  Maintain a healthy weight. The body mass index (BMI) is a screening tool to identify possible weight problems. It provides an estimate of body fat based on height and weight. Your health care provider can find your BMI and can help you achieve or maintain a healthy weight.For adults 20 years and older:  A BMI below 18.5 is considered underweight.  A BMI of 18.5 to 24.9 is normal.  A BMI of 25 to 29.9 is considered overweight.  A  BMI of 30 and above is considered obese.  Maintain normal blood lipids and cholesterol levels by exercising and minimizing your intake of saturated fat. Eat a balanced diet with plenty of fruit and vegetables. Blood tests for lipids and cholesterol should begin at age 45 and be repeated every 5 years. If your lipid or cholesterol levels are high, you are over 50, or you are at high risk for heart disease, you may need your cholesterol levels checked more frequently.Ongoing high lipid and cholesterol levels should be treated with medicines if diet and exercise are not working.  If you smoke, find out from your health care provider how to quit. If you do not use tobacco, do not start.  Lung cancer screening is recommended for adults aged 45-80 years who are at high risk for developing lung cancer because of a history of smoking. A yearly low-dose CT scan of the lungs is recommended for people who have at least a 30-pack-year history of smoking and are a current smoker or have quit within the past 15 years. A pack year of smoking is smoking an average of 1 pack of cigarettes a day for 1 year (for example: 1 pack a day for 30 years or 2 packs a day for 15 years). Yearly screening should continue until the smoker has stopped smoking for at least 15 years. Yearly screening should be stopped for people who develop a health problem that would prevent them from having lung cancer treatment.  If you are pregnant, do not drink alcohol. If you are  breastfeeding, be very cautious about drinking alcohol. If you are not pregnant and choose to drink alcohol, do not have more than 1 drink per day. One drink is considered to be 12 ounces (355 mL) of beer, 5 ounces (148 mL) of wine, or 1.5 ounces (44 mL) of liquor.  Avoid use of street drugs. Do not share needles with anyone. Ask for help if you need support or instructions about stopping the use of drugs.  High blood pressure causes heart disease and increases the risk  of stroke. Your blood pressure should be checked at least every 1 to 2 years. Ongoing high blood pressure should be treated with medicines if weight loss and exercise do not work.  If you are 55-79 years old, ask your health care provider if you should take aspirin to prevent strokes.  Diabetes screening is done by taking a blood sample to check your blood glucose level after you have not eaten for a certain period of time (fasting). If you are not overweight and you do not have risk factors for diabetes, you should be screened once every 3 years starting at age 45. If you are overweight or obese and you are 40-70 years of age, you should be screened for diabetes every year as part of your cardiovascular risk assessment.  Breast cancer screening is essential preventive care for women. You should practice "breast self-awareness." This means understanding the normal appearance and feel of your breasts and may include breast self-examination. Any changes detected, no matter how small, should be reported to a health care provider. Women in their 20s and 30s should have a clinical breast exam (CBE) by a health care provider as part of a regular health exam every 1 to 3 years. After age 40, women should have a CBE every year. Starting at age 40, women should consider having a mammogram (breast X-ray test) every year. Women who have a family history of breast cancer should talk to their health care provider about genetic screening. Women at a high risk of breast cancer should talk to their health care providers about having an MRI and a mammogram every year.  Breast cancer gene (BRCA)-related cancer risk assessment is recommended for women who have family members with BRCA-related cancers. BRCA-related cancers include breast, ovarian, tubal, and peritoneal cancers. Having family members with these cancers may be associated with an increased risk for harmful changes (mutations) in the breast cancer genes BRCA1 and  BRCA2. Results of the assessment will determine the need for genetic counseling and BRCA1 and BRCA2 testing.  Your health care provider may recommend that you be screened regularly for cancer of the pelvic organs (ovaries, uterus, and vagina). This screening involves a pelvic examination, including checking for microscopic changes to the surface of your cervix (Pap test). You may be encouraged to have this screening done every 3 years, beginning at age 21.  For women ages 30-65, health care providers may recommend pelvic exams and Pap testing every 3 years, or they may recommend the Pap and pelvic exam, combined with testing for human papilloma virus (HPV), every 5 years. Some types of HPV increase your risk of cervical cancer. Testing for HPV may also be done on women of any age with unclear Pap test results.  Other health care providers may not recommend any screening for nonpregnant women who are considered low risk for pelvic cancer and who do not have symptoms. Ask your health care provider if a screening pelvic exam is right for   you.  If you have had past treatment for cervical cancer or a condition that could lead to cancer, you need Pap tests and screening for cancer for at least 20 years after your treatment. If Pap tests have been discontinued, your risk factors (such as having a new sexual partner) need to be reassessed to determine if screening should resume. Some women have medical problems that increase the chance of getting cervical cancer. In these cases, your health care provider may recommend more frequent screening and Pap tests.  Colorectal cancer can be detected and often prevented. Most routine colorectal cancer screening begins at the age of 50 years and continues through age 75 years. However, your health care provider may recommend screening at an earlier age if you have risk factors for colon cancer. On a yearly basis, your health care provider may provide home test kits to check  for hidden blood in the stool. Use of a small camera at the end of a tube, to directly examine the colon (sigmoidoscopy or colonoscopy), can detect the earliest forms of colorectal cancer. Talk to your health care provider about this at age 50, when routine screening begins. Direct exam of the colon should be repeated every 5-10 years through age 75 years, unless early forms of precancerous polyps or small growths are found.  People who are at an increased risk for hepatitis B should be screened for this virus. You are considered at high risk for hepatitis B if:  You were born in a country where hepatitis B occurs often. Talk with your health care provider about which countries are considered high risk.  Your parents were born in a high-risk country and you have not received a shot to protect against hepatitis B (hepatitis B vaccine).  You have HIV or AIDS.  You use needles to inject street drugs.  You live with, or have sex with, someone who has hepatitis B.  You get hemodialysis treatment.  You take certain medicines for conditions like cancer, organ transplantation, and autoimmune conditions.  Hepatitis C blood testing is recommended for all people born from 1945 through 1965 and any individual with known risks for hepatitis C.  Practice safe sex. Use condoms and avoid high-risk sexual practices to reduce the spread of sexually transmitted infections (STIs). STIs include gonorrhea, chlamydia, syphilis, trichomonas, herpes, HPV, and human immunodeficiency virus (HIV). Herpes, HIV, and HPV are viral illnesses that have no cure. They can result in disability, cancer, and death.  You should be screened for sexually transmitted illnesses (STIs) including gonorrhea and chlamydia if:  You are sexually active and are younger than 24 years.  You are older than 24 years and your health care provider tells you that you are at risk for this type of infection.  Your sexual activity has changed  since you were last screened and you are at an increased risk for chlamydia or gonorrhea. Ask your health care provider if you are at risk.  If you are at risk of being infected with HIV, it is recommended that you take a prescription medicine daily to prevent HIV infection. This is called preexposure prophylaxis (PrEP). You are considered at risk if:  You are sexually active and do not regularly use condoms or know the HIV status of your partner(s).  You take drugs by injection.  You are sexually active with a partner who has HIV.  Talk with your health care provider about whether you are at high risk of being infected with HIV. If   you choose to begin PrEP, you should first be tested for HIV. You should then be tested every 3 months for as long as you are taking PrEP.  Osteoporosis is a disease in which the bones lose minerals and strength with aging. This can result in serious bone fractures or breaks. The risk of osteoporosis can be identified using a bone density scan. Women ages 67 years and over and women at risk for fractures or osteoporosis should discuss screening with their health care providers. Ask your health care provider whether you should take a calcium supplement or vitamin D to reduce the rate of osteoporosis.  Menopause can be associated with physical symptoms and risks. Hormone replacement therapy is available to decrease symptoms and risks. You should talk to your health care provider about whether hormone replacement therapy is right for you.  Use sunscreen. Apply sunscreen liberally and repeatedly throughout the day. You should seek shade when your shadow is shorter than you. Protect yourself by wearing long sleeves, pants, a wide-brimmed hat, and sunglasses year round, whenever you are outdoors.  Once a month, do a whole body skin exam, using a mirror to look at the skin on your back. Tell your health care provider of new moles, moles that have irregular borders, moles that  are larger than a pencil eraser, or moles that have changed in shape or color.  Stay current with required vaccines (immunizations).  Influenza vaccine. All adults should be immunized every year.  Tetanus, diphtheria, and acellular pertussis (Td, Tdap) vaccine. Pregnant women should receive 1 dose of Tdap vaccine during each pregnancy. The dose should be obtained regardless of the length of time since the last dose. Immunization is preferred during the 27th-36th week of gestation. An adult who has not previously received Tdap or who does not know her vaccine status should receive 1 dose of Tdap. This initial dose should be followed by tetanus and diphtheria toxoids (Td) booster doses every 10 years. Adults with an unknown or incomplete history of completing a 3-dose immunization series with Td-containing vaccines should begin or complete a primary immunization series including a Tdap dose. Adults should receive a Td booster every 10 years.  Varicella vaccine. An adult without evidence of immunity to varicella should receive 2 doses or a second dose if she has previously received 1 dose. Pregnant females who do not have evidence of immunity should receive the first dose after pregnancy. This first dose should be obtained before leaving the health care facility. The second dose should be obtained 4-8 weeks after the first dose.  Human papillomavirus (HPV) vaccine. Females aged 13-26 years who have not received the vaccine previously should obtain the 3-dose series. The vaccine is not recommended for use in pregnant females. However, pregnancy testing is not needed before receiving a dose. If a female is found to be pregnant after receiving a dose, no treatment is needed. In that case, the remaining doses should be delayed until after the pregnancy. Immunization is recommended for any person with an immunocompromised condition through the age of 61 years if she did not get any or all doses earlier. During the  3-dose series, the second dose should be obtained 4-8 weeks after the first dose. The third dose should be obtained 24 weeks after the first dose and 16 weeks after the second dose.  Zoster vaccine. One dose is recommended for adults aged 30 years or older unless certain conditions are present.  Measles, mumps, and rubella (MMR) vaccine. Adults born  before 1957 generally are considered immune to measles and mumps. Adults born in 1957 or later should have 1 or more doses of MMR vaccine unless there is a contraindication to the vaccine or there is laboratory evidence of immunity to each of the three diseases. A routine second dose of MMR vaccine should be obtained at least 28 days after the first dose for students attending postsecondary schools, health care workers, or international travelers. People who received inactivated measles vaccine or an unknown type of measles vaccine during 1963-1967 should receive 2 doses of MMR vaccine. People who received inactivated mumps vaccine or an unknown type of mumps vaccine before 1979 and are at high risk for mumps infection should consider immunization with 2 doses of MMR vaccine. For females of childbearing age, rubella immunity should be determined. If there is no evidence of immunity, females who are not pregnant should be vaccinated. If there is no evidence of immunity, females who are pregnant should delay immunization until after pregnancy. Unvaccinated health care workers born before 1957 who lack laboratory evidence of measles, mumps, or rubella immunity or laboratory confirmation of disease should consider measles and mumps immunization with 2 doses of MMR vaccine or rubella immunization with 1 dose of MMR vaccine.  Pneumococcal 13-valent conjugate (PCV13) vaccine. When indicated, a person who is uncertain of his immunization history and has no record of immunization should receive the PCV13 vaccine. All adults 65 years of age and older should receive this  vaccine. An adult aged 19 years or older who has certain medical conditions and has not been previously immunized should receive 1 dose of PCV13 vaccine. This PCV13 should be followed with a dose of pneumococcal polysaccharide (PPSV23) vaccine. Adults who are at high risk for pneumococcal disease should obtain the PPSV23 vaccine at least 8 weeks after the dose of PCV13 vaccine. Adults older than 63 years of age who have normal immune system function should obtain the PPSV23 vaccine dose at least 1 year after the dose of PCV13 vaccine.  Pneumococcal polysaccharide (PPSV23) vaccine. When PCV13 is also indicated, PCV13 should be obtained first. All adults aged 65 years and older should be immunized. An adult younger than age 65 years who has certain medical conditions should be immunized. Any person who resides in a nursing home or long-term care facility should be immunized. An adult smoker should be immunized. People with an immunocompromised condition and certain other conditions should receive both PCV13 and PPSV23 vaccines. People with human immunodeficiency virus (HIV) infection should be immunized as soon as possible after diagnosis. Immunization during chemotherapy or radiation therapy should be avoided. Routine use of PPSV23 vaccine is not recommended for American Indians, [place] Natives, or people younger than 65 years unless there are medical conditions that require PPSV23 vaccine. When indicated, people who have unknown immunization and have no record of immunization should receive PPSV23 vaccine. One-time revaccination 5 years after the first dose of PPSV23 is recommended for people aged 19-64 years who have chronic kidney failure, nephrotic syndrome, asplenia, or immunocompromised conditions. People who received 1-2 doses of PPSV23 before age 65 years should receive another dose of PPSV23 vaccine at age 65 years or later if at least 5 years have passed since the previous dose. Doses of PPSV23 are not  needed for people immunized with PPSV23 at or after age 65 years.  Meningococcal vaccine. Adults with asplenia or persistent complement component deficiencies should receive 2 doses of quadrivalent meningococcal conjugate (MenACWY-D) vaccine. The doses should be obtained   at least 2 months apart. Microbiologists working with certain meningococcal bacteria, [place] recruits, people at risk during an outbreak, and people who travel to or live in countries with a high rate of meningitis should be immunized. A first-year college student up through age 34 years who is living in a residence hall should receive a dose if she did not receive a dose on or after her 16th birthday. Adults who have certain high-risk conditions should receive one or more doses of vaccine.  Hepatitis A vaccine. Adults who wish to be protected from this disease, have certain high-risk conditions, work with hepatitis A-infected animals, work in hepatitis A research labs, or travel to or work in countries with a high rate of hepatitis A should be immunized. Adults who were previously unvaccinated and who anticipate close contact with an international adoptee during the first 60 days after arrival in the [place] from a country with a high rate of hepatitis A should be immunized.  Hepatitis B vaccine. Adults who wish to be protected from this disease, have certain high-risk conditions, may be exposed to blood or other infectious body fluids, are household contacts or sex partners of hepatitis B positive people, are clients or workers in certain care facilities, or travel to or work in countries with a high rate of hepatitis B should be immunized.  Haemophilus influenzae type b (Hib) vaccine. A previously unvaccinated person with asplenia or sickle cell disease or having a scheduled splenectomy should receive 1 dose of Hib vaccine. Regardless of previous immunization, a recipient of a hematopoietic stem cell transplant should receive a  3-dose series 6-12 months after her successful transplant. Hib vaccine is not recommended for adults with HIV infection. Preventive Services / Frequency Ages 35 to 4 years  Blood pressure check.** / Every 3-5 years.  Lipid and cholesterol check.** / Every 5 years beginning at age 60.  Clinical breast exam.** / Every 3 years for women in their 71s and 10s.  BRCA-related cancer risk assessment.** / For women who have family members with a BRCA-related cancer (breast, ovarian, tubal, or peritoneal cancers).  Pap test.** / Every 2 years from ages 76 through 26. Every 3 years starting at age 61 through age 76 or 93 with a history of 3 consecutive normal Pap tests.  HPV screening.** / Every 3 years from ages 37 through ages 60 to 51 with a history of 3 consecutive normal Pap tests.  Hepatitis C blood test.** / For any individual with known risks for hepatitis C.  Skin self-exam. / Monthly.  Influenza vaccine. / Every year.  Tetanus, diphtheria, and acellular pertussis (Tdap, Td) vaccine.** / Consult your health care provider. Pregnant women should receive 1 dose of Tdap vaccine during each pregnancy. 1 dose of Td every 10 years.  Varicella vaccine.** / Consult your health care provider. Pregnant females who do not have evidence of immunity should receive the first dose after pregnancy.  HPV vaccine. / 3 doses over 6 months, if 93 and younger. The vaccine is not recommended for use in pregnant females. However, pregnancy testing is not needed before receiving a dose.  Measles, mumps, rubella (MMR) vaccine.** / You need at least 1 dose of MMR if you were born in 1957 or later. You may also need a 2nd dose. For females of childbearing age, rubella immunity should be determined. If there is no evidence of immunity, females who are not pregnant should be vaccinated. If there is no evidence of immunity, females who are  pregnant should delay immunization until after pregnancy.  Pneumococcal  13-valent conjugate (PCV13) vaccine.** / Consult your health care provider.  Pneumococcal polysaccharide (PPSV23) vaccine.** / 1 to 2 doses if you smoke cigarettes or if you have certain conditions.  Meningococcal vaccine.** / 1 dose if you are age 68 to 8 years and a Market researcher living in a residence hall, or have one of several medical conditions, you need to get vaccinated against meningococcal disease. You may also need additional booster doses.  Hepatitis A vaccine.** / Consult your health care provider.  Hepatitis B vaccine.** / Consult your health care provider.  Haemophilus influenzae type b (Hib) vaccine.** / Consult your health care provider. Ages 7 to 53 years  Blood pressure check.** / Every year.  Lipid and cholesterol check.** / Every 5 years beginning at age 25 years.  Lung cancer screening. / Every year if you are aged 11-80 years and have a 30-pack-year history of smoking and currently smoke or have quit within the past 15 years. Yearly screening is stopped once you have quit smoking for at least 15 years or develop a health problem that would prevent you from having lung cancer treatment.  Clinical breast exam.** / Every year after age 48 years.  BRCA-related cancer risk assessment.** / For women who have family members with a BRCA-related cancer (breast, ovarian, tubal, or peritoneal cancers).  Mammogram.** / Every year beginning at age 41 years and continuing for as long as you are in good health. Consult with your health care provider.  Pap test.** / Every 3 years starting at age 65 years through age 37 or 70 years with a history of 3 consecutive normal Pap tests.  HPV screening.** / Every 3 years from ages 72 years through ages 60 to 40 years with a history of 3 consecutive normal Pap tests.  Fecal occult blood test (FOBT) of stool. / Every year beginning at age 21 years and continuing until age 5 years. You may not need to do this test if you get  a colonoscopy every 10 years.  Flexible sigmoidoscopy or colonoscopy.** / Every 5 years for a flexible sigmoidoscopy or every 10 years for a colonoscopy beginning at age 35 years and continuing until age 48 years.  Hepatitis C blood test.** / For all people born from [birth year] through 1965 and any individual with known risks for hepatitis C.  Skin self-exam. / Monthly.  Influenza vaccine. / Every year.  Tetanus, diphtheria, and acellular pertussis (Tdap/Td) vaccine.** / Consult your health care provider. Pregnant women should receive 1 dose of Tdap vaccine during each pregnancy. 1 dose of Td every 10 years.  Varicella vaccine.** / Consult your health care provider. Pregnant females who do not have evidence of immunity should receive the first dose after pregnancy.  Zoster vaccine.** / 1 dose for adults aged 30 years or older.  Measles, mumps, rubella (MMR) vaccine.** / You need at least 1 dose of MMR if you were born in 1957 or later. You may also need a second dose. For females of childbearing age, rubella immunity should be determined. If there is no evidence of immunity, females who are not pregnant should be vaccinated. If there is no evidence of immunity, females who are pregnant should delay immunization until after pregnancy.  Pneumococcal 13-valent conjugate (PCV13) vaccine.** / Consult your health care provider.  Pneumococcal polysaccharide (PPSV23) vaccine.** / 1 to 2 doses if you smoke cigarettes or if you have certain conditions.  Meningococcal vaccine.** /  Consult your health care provider.  Hepatitis A vaccine.** / Consult your health care provider.  Hepatitis B vaccine.** / Consult your health care provider.  Haemophilus influenzae type b (Hib) vaccine.** / Consult your health care provider. Ages 64 years and over  Blood pressure check.** / Every year.  Lipid and cholesterol check.** / Every 5 years beginning at age 23 years.  Lung cancer screening. / Every year if you  are aged 16-80 years and have a 30-pack-year history of smoking and currently smoke or have quit within the past 15 years. Yearly screening is stopped once you have quit smoking for at least 15 years or develop a health problem that would prevent you from having lung cancer treatment.  Clinical breast exam.** / Every year after age 74 years.  BRCA-related cancer risk assessment.** / For women who have family members with a BRCA-related cancer (breast, ovarian, tubal, or peritoneal cancers).  Mammogram.** / Every year beginning at age 44 years and continuing for as long as you are in good health. Consult with your health care provider.  Pap test.** / Every 3 years starting at age 58 years through age 22 or 39 years with 3 consecutive normal Pap tests. Testing can be stopped between 65 and 70 years with 3 consecutive normal Pap tests and no abnormal Pap or HPV tests in the past 10 years.  HPV screening.** / Every 3 years from ages 64 years through ages 70 or 61 years with a history of 3 consecutive normal Pap tests. Testing can be stopped between 65 and 70 years with 3 consecutive normal Pap tests and no abnormal Pap or HPV tests in the past 10 years.  Fecal occult blood test (FOBT) of stool. / Every year beginning at age 40 years and continuing until age 27 years. You may not need to do this test if you get a colonoscopy every 10 years.  Flexible sigmoidoscopy or colonoscopy.** / Every 5 years for a flexible sigmoidoscopy or every 10 years for a colonoscopy beginning at age 7 years and continuing until age 32 years.  Hepatitis C blood test.** / For all people born from [birth year] through 1965 and any individual with known risks for hepatitis C.  Osteoporosis screening.** / A one-time screening for women ages 30 years and over and women at risk for fractures or osteoporosis.  Skin self-exam. / Monthly.  Influenza vaccine. / Every year.  Tetanus, diphtheria, and acellular pertussis (Tdap/Td)  vaccine.** / 1 dose of Td every 10 years.  Varicella vaccine.** / Consult your health care provider.  Zoster vaccine.** / 1 dose for adults aged 35 years or older.  Pneumococcal 13-valent conjugate (PCV13) vaccine.** / Consult your health care provider.  Pneumococcal polysaccharide (PPSV23) vaccine.** / 1 dose for all adults aged 46 years and older.  Meningococcal vaccine.** / Consult your health care provider.  Hepatitis A vaccine.** / Consult your health care provider.  Hepatitis B vaccine.** / Consult your health care provider.  Haemophilus influenzae type b (Hib) vaccine.** / Consult your health care provider. ** Family history and personal history of risk and conditions may change your health care provider's recommendations.   This information is not intended to replace advice given to you by your health care provider. Make sure you discuss any questions you have with your health care provider.   Document Released: 10/17/2001 Document Revised: 09/11/2014 Document Reviewed: 01/16/2011 Elsevier Interactive Patient Education [employer].

## 2016-07-14 ENCOUNTER — Other Ambulatory Visit: Payer: Self-pay | Admitting: Family Medicine

## 2016-07-14 LAB — CBC
HCT: 39 % (ref 36.0–46.0)
Hemoglobin: 13 g/dL (ref 12.0–15.0)
MCHC: 33.4 g/dL (ref 30.0–36.0)
MCV: 87.5 fl (ref 78.0–100.0)
Platelets: 311 10*3/uL (ref 150.0–400.0)
RBC: 4.46 Mil/uL (ref 3.87–5.11)
RDW: 13.6 % (ref 11.5–15.5)
WBC: 8.9 10*3/uL (ref 4.0–10.5)

## 2016-07-14 LAB — COMPREHENSIVE METABOLIC PANEL
ALT: 20 U/L (ref 0–35)
AST: 23 U/L (ref 0–37)
Albumin: 4.6 g/dL (ref 3.5–5.2)
Alkaline Phosphatase: 58 U/L (ref 39–117)
BUN: 19 mg/dL (ref 6–23)
CO2: 29 mEq/L (ref 19–32)
Calcium: 10.2 mg/dL (ref 8.4–10.5)
Chloride: 101 mEq/L (ref 96–112)
Creatinine, Ser: 0.78 mg/dL (ref 0.40–1.20)
GFR: 79.2 mL/min (ref >60.00)
Glucose, Bld: 87 mg/dL (ref 70–99)
Potassium: 4.2 mEq/L (ref 3.5–5.1)
Sodium: 139 mEq/L (ref 135–145)
Total Bilirubin: 0.4 mg/dL (ref 0.2–1.2)
Total Protein: 7.9 g/dL (ref 6.0–8.3)

## 2016-07-14 LAB — LIPID PANEL
Cholesterol: 229 mg/dL — ABNORMAL HIGH (ref 0–200)
HDL: 74.3 mg/dL (ref >39.00)
LDL Cholesterol: 131 mg/dL — ABNORMAL HIGH (ref 0–99)
NonHDL: 154.95
Total CHOL/HDL Ratio: 3
Triglycerides: 121 mg/dL (ref 0.0–149.0)
VLDL: 24.2 mg/dL (ref 0.0–40.0)

## 2016-07-14 LAB — TSH: TSH: 0.72 u[IU]/mL (ref 0.35–4.50)

## 2016-07-14 LAB — VITAMIN D 25 HYDROXY (VIT D DEFICIENCY, FRACTURES): VITD: 28.46 ng/mL — ABNORMAL LOW (ref 30.00–100.00)

## 2016-07-14 MED ORDER — VITAMIN D (ERGOCALCIFEROL) 1.25 MG (50000 UNIT) PO CAPS: 50000.0000 [IU] | ORAL_CAPSULE | ORAL | 0 refills | Status: DC

## 2016-07-16 NOTE — Assessment & Plan Note (Signed)
Encouraged to get adequate exercise, calcium and vitamin d intake 

## 2016-07-16 NOTE — Progress Notes (Signed)
Patient ID: Susan Chang, female   DOB: [birth year], 65 y.o.   MRN: [id number]   Subjective:    Patient ID: Susan Chang, female    DOB: October 21, 1950, 65 y.o.   MRN: [id number]  Chief Complaint  Patient presents with  . Annual Exam    HPI Patient is in today for annual preventative exam. No recent illness or hospitalizations. Declines flu shot. She stays active and tries to maintain a heart healthy diet. Notes a new lesion on her face that is not resolving. Denies CP/palp/SOB/HA/congestion/fevers/GI or GU c/o. Taking meds as prescribed  Past Medical History:  Diagnosis Date  . Breast cancer ([place]) 2000   right  . Osteopenia   . Preventative health care 07/13/2016  . Vitamin D deficiency 11/14/2015    Past Surgical History:  Procedure Laterality Date  . ABDOMINAL HYSTERECTOMY  2001   TAH b/l SPO for ovarian cyst,  . APPENDECTOMY    . BREAST BIOPSY Right 2000  . BREAST SURGERY  2000   cancer, right lumpectomy  . LYMPH NODE DISSECTION Right 2000    Family History  Problem Relation Age of Onset  . Cancer Mother     Breast cancer  . Hypertension Mother   . Stroke Mother   . Cancer Father     lung cancer  . Cancer Paternal Aunt     breast cancer  . Cancer Brother     Social History   Social History  . Marital status: Married    Spouse name: N/A  . Number of children: N/A  . Years of education: N/A   Occupational History  . Production manager    Social History Main Topics  . Smoking status: Never Smoker  . Smokeless tobacco: Never Used  . Alcohol use 0.0 oz/week  . Drug use: No  . Sexual activity: Yes     Comment: lives with husband, drafting and design, no dietary restrictions   Other Topics Concern  . Not on file   Social History Narrative   No major dietary restrictions but eats very little meat   Stays very physically active. Walks daily   Works Acupuncturist    Outpatient Medications Prior to Visit  Medication Sig Dispense Refill  .  ibuprofen (GOODSENSE IBUPROFEN) 200 MG tablet Take 200 mg by mouth as needed.    . Vitamin D, Ergocalciferol, (DRISDOL) 50000 units CAPS capsule Take 1 capsule (50,000 Units total) by mouth every 7 (seven) days. (Patient not taking: Reported on 07/13/2016) 12 capsule 0   No facility-administered medications prior to visit.     No Known Allergies  Review of Systems  Constitutional: Negative for chills, fever and malaise/fatigue.  HENT: Negative for congestion and hearing loss.   Eyes: Negative for discharge.  Respiratory: Negative for cough, sputum production and shortness of breath.   Cardiovascular: Negative for chest pain, palpitations and leg swelling.  Gastrointestinal: Negative for abdominal pain, blood in stool, constipation, diarrhea, heartburn, nausea and vomiting.  Genitourinary: Negative for dysuria, frequency, hematuria and urgency.  Musculoskeletal: Negative for back pain, falls and myalgias.  Skin: Negative for rash.  Neurological: Negative for dizziness, sensory change, loss of consciousness, weakness and headaches.  Endo/Heme/Allergies: Negative for environmental allergies. Does not bruise/bleed easily.  Psychiatric/Behavioral: Negative for depression and suicidal ideas. The patient is not nervous/anxious and does not have insomnia.        Objective:    Physical Exam  Constitutional: She is oriented to person, place, and time. She  appears well-developed and well-nourished. No distress.  HENT:  Head: Normocephalic and atraumatic.  Eyes: Conjunctivae are normal.  Neck: Neck supple. No thyromegaly present.  Cardiovascular: Normal rate, regular rhythm and normal heart sounds.   No murmur heard. Pulmonary/Chest: Effort normal and breath sounds normal. No respiratory distress.  Abdominal: Soft. Bowel sounds are normal. She exhibits no distension and no mass. There is no tenderness.  Musculoskeletal: She exhibits no edema.  Lymphadenopathy:    She has no cervical  adenopathy.  Neurological: She is alert and oriented to person, place, and time.  Skin: Skin is warm and dry.  Scaly, raised lesion on face.   Psychiatric: She has a normal mood and affect. Her behavior is normal.    BP 113/67 (BP Location: Left Arm, Patient Position: Sitting, Cuff Size: Normal)   Pulse 83   Temp 98.5 F (36.9 C) (Oral)   Ht 5\' 6"  (1.676 m)   Wt 151 lb 4 oz (68.6 kg)   SpO2 100%   BMI 24.41 kg/m  Wt Readings from Last 3 Encounters:  07/13/16 151 lb 4 oz (68.6 kg)  02/01/16 149 lb 14.4 oz (68 kg)  11/09/15 151 lb 2 oz (68.5 kg)     Lab Results  Component Value Date   WBC 8.9 07/13/2016   HGB 13.0 07/13/2016   HCT 39.0 07/13/2016   PLT 311.0 07/13/2016   GLUCOSE 87 07/13/2016   CHOL 229 (H) 07/13/2016   TRIG 121.0 07/13/2016   HDL 74.30 07/13/2016   LDLCALC 131 (H) 07/13/2016   ALT 20 07/13/2016   AST 23 07/13/2016   NA 139 07/13/2016   K 4.2 07/13/2016   CL 101 07/13/2016   CREATININE 0.78 07/13/2016   BUN 19 07/13/2016   CO2 29 07/13/2016   TSH 0.72 07/13/2016    Lab Results  Component Value Date   TSH 0.72 07/13/2016   Lab Results  Component Value Date   WBC 8.9 07/13/2016   HGB 13.0 07/13/2016   HCT 39.0 07/13/2016   MCV 87.5 07/13/2016   PLT 311.0 07/13/2016   Lab Results  Component Value Date   NA 139 07/13/2016   K 4.2 07/13/2016   CO2 29 07/13/2016   GLUCOSE 87 07/13/2016   BUN 19 07/13/2016   CREATININE 0.78 07/13/2016   BILITOT 0.4 07/13/2016   ALKPHOS 58 07/13/2016   AST 23 07/13/2016   ALT 20 07/13/2016   PROT 7.9 07/13/2016   ALBUMIN 4.6 07/13/2016   CALCIUM 10.2 07/13/2016   GFR 79.20 07/13/2016   Lab Results  Component Value Date   CHOL 229 (H) 07/13/2016   Lab Results  Component Value Date   HDL 74.30 07/13/2016   Lab Results  Component Value Date   LDLCALC 131 (H) 07/13/2016   Lab Results  Component Value Date   TRIG 121.0 07/13/2016   Lab Results  Component Value Date   CHOLHDL 3 07/13/2016    No results found for: HGBA1C     Assessment & Plan:   Problem List Items Addressed This Visit    Osteopenia    Encouraged to get adequate exercise, calcium and vitamin d intake      Vitamin D deficiency   Relevant Orders   VITAMIN D 25 Hydroxy (Vit-D Deficiency, Fractures) (Completed)   Preventative health care    Patient encouraged to maintain heart healthy diet, regular exercise, adequate sleep. Consider daily probiotics. Take medications as prescribed. Given and reviewed copy of ACP documents from [facility] of  State and encouraged to complete and return      Relevant Orders   CBC (Completed)   Comprehensive metabolic panel (Completed)   TSH (Completed)   Lipid panel (Completed)    Other Visit Diagnoses    Facial skin lesion    -  Primary   Relevant Orders   Ambulatory referral to Dermatology      I am having Ms. Baca maintain her ibuprofen.  No orders of the defined types were placed in this encounter.    Penni Homans, MD

## 2016-07-16 NOTE — Assessment & Plan Note (Signed)
Patient encouraged to maintain heart healthy diet, regular exercise, adequate sleep. Consider daily probiotics. Take medications as prescribed. Given and reviewed copy of ACP documents from [place] Secretary of State and encouraged to complete and return 

## 2016-09-29 DIAGNOSIS — B079 Viral wart, unspecified: Secondary | ICD-10-CM | POA: Diagnosis not present

## 2016-09-29 DIAGNOSIS — L821 Other seborrheic keratosis: Secondary | ICD-10-CM | POA: Diagnosis not present

## 2016-12-21 DIAGNOSIS — Z1231 Encounter for screening mammogram for malignant neoplasm of breast: Secondary | ICD-10-CM | POA: Diagnosis not present

## 2016-12-21 DIAGNOSIS — Z853 Personal history of malignant neoplasm of breast: Secondary | ICD-10-CM | POA: Diagnosis not present

## 2016-12-22 ENCOUNTER — Encounter: Payer: Self-pay | Admitting: *Deleted

## 2016-12-22 NOTE — Progress Notes (Signed)
Signed orders signed and faxed to [facility] per Dr. Benay Spice.

## 2017-02-01 ENCOUNTER — Ambulatory Visit (HOSPITAL_BASED_OUTPATIENT_CLINIC_OR_DEPARTMENT_OTHER): Payer: 59 | Admitting: Oncology

## 2017-02-01 VITALS — BP 112/67 | HR 69 | Temp 98.3°F | Resp 18 | Ht 66.0 in | Wt 152.0 lb

## 2017-02-01 DIAGNOSIS — Z853 Personal history of malignant neoplasm of breast: Secondary | ICD-10-CM

## 2017-02-01 DIAGNOSIS — M8580 Other specified disorders of bone density and structure, unspecified site: Secondary | ICD-10-CM

## 2017-02-01 DIAGNOSIS — C50911 Malignant neoplasm of unspecified site of right female breast: Secondary | ICD-10-CM

## 2017-02-01 DIAGNOSIS — Z171 Estrogen receptor negative status [ER-]: Principal | ICD-10-CM

## 2017-02-01 NOTE — Progress Notes (Signed)
  [facility] OFFICE PROGRESS NOTE   Diagnosis: Breast cancer  INTERVAL HISTORY:   She returns as scheduled. She feels well. No palpable change over either breast. Good appetite. She has intermittent right-sided headaches. These occur approximately monthly and can last for 1-2 days. No associated symptoms. No sinus drainage. A mammogram on 12/21/2016 revealed a possible area of architectural distortion in the right breast central to the nipple at posterior depth. Additional views of the right breast the area of concern in the right breast could not be reproduced and was felt to represent superimposed breast tissue. A one-year diagnostic mammogram was recommended.  Objective:  Vital signs in last 24 hours:  Blood pressure 112/67, pulse 69, temperature 98.3 F (36.8 C), temperature source Oral, resp. rate 18, height 5\' 6"  (1.676 m), weight 152 lb (68.9 kg), SpO2 100 %.    HEENT: Neck without mass Lymphatics: No cervical, supraclavicular, or axillary nodes Resp: Lungs clear bilaterally Cardio: Regular rate and rhythm GI: No hepatosplenomegaly Vascular: No leg edema Breasts: Status post right lumpectomy. No evidence for local tumor recurrence. No mass in either breast.   Medications: I have reviewed the patient's current medications.  Assessment/Plan: 1. 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 2. Osteopenia -not taking calcium or vitamin D. Stable bone density scan 02/12/2015  Disposition:  She remains in clinical remission from breast cancer. She would like to continue follow[facility]up at the [facility]. She will be scheduled for a bone density scan, mammogram, and office visit in one year. She will follow-up with Dr. Charlett Blake if the headaches persist.  15 minutes were spent with the patient today. The majority of the time was used for counseling and coordination of care. Donneta Romberg, MD  02/01/2017  9:33 AM

## 2017-02-06 ENCOUNTER — Telehealth: Payer: Self-pay | Admitting: Oncology

## 2017-02-06 NOTE — Telephone Encounter (Signed)
Patient bypassed scheduling on 02/01/17. Follow up appointment scheduled for 1 year, per 02/01/17 los. Patient was mailed an appointment letter and schedule.

## 2017-02-11 DIAGNOSIS — S0096XA Insect bite (nonvenomous) of unspecified part of head, initial encounter: Secondary | ICD-10-CM | POA: Diagnosis not present

## 2017-04-22 DIAGNOSIS — B999 Unspecified infectious disease: Secondary | ICD-10-CM | POA: Diagnosis not present

## 2017-04-22 DIAGNOSIS — W57XXXA Bitten or stung by nonvenomous insect and other nonvenomous arthropods, initial encounter: Secondary | ICD-10-CM | POA: Diagnosis not present

## 2017-07-09 ENCOUNTER — Encounter: Payer: Self-pay | Admitting: Family Medicine

## 2017-07-20 ENCOUNTER — Ambulatory Visit (INDEPENDENT_AMBULATORY_CARE_PROVIDER_SITE_OTHER): Payer: 59 | Admitting: Family Medicine

## 2017-07-20 ENCOUNTER — Other Ambulatory Visit (HOSPITAL_COMMUNITY)
Admission: RE | Admit: 2017-07-20 | Discharge: 2017-07-20 | Disposition: A | Discharge status: Home / Self Care | Payer: 59 | Source: Ambulatory Visit | Attending: Family Medicine | Admitting: Family Medicine

## 2017-07-20 ENCOUNTER — Encounter: Payer: Self-pay | Admitting: Family Medicine

## 2017-07-20 VITALS — BP 118/70 | HR 68 | Temp 97.6°F | Resp 18 | Ht 65.5 in | Wt 156.6 lb

## 2017-07-20 DIAGNOSIS — M8580 Other specified disorders of bone density and structure, unspecified site: Secondary | ICD-10-CM | POA: Diagnosis not present

## 2017-07-20 DIAGNOSIS — Z124 Encounter for screening for malignant neoplasm of cervix: Secondary | ICD-10-CM | POA: Insufficient documentation

## 2017-07-20 DIAGNOSIS — Z0000 Encounter for general adult medical examination without abnormal findings: Secondary | ICD-10-CM

## 2017-07-20 DIAGNOSIS — Z86010 Personal history of colonic polyps: Secondary | ICD-10-CM | POA: Diagnosis not present

## 2017-07-20 DIAGNOSIS — E785 Hyperlipidemia, unspecified: Secondary | ICD-10-CM | POA: Insufficient documentation

## 2017-07-20 DIAGNOSIS — E782 Mixed hyperlipidemia: Secondary | ICD-10-CM | POA: Diagnosis not present

## 2017-07-20 DIAGNOSIS — E559 Vitamin D deficiency, unspecified: Secondary | ICD-10-CM | POA: Diagnosis not present

## 2017-07-20 LAB — CBC
HCT: 40.7 % (ref 36.0–46.0)
Hemoglobin: 13.3 g/dL (ref 12.0–15.0)
MCHC: 32.7 g/dL (ref 30.0–36.0)
MCV: 90.1 fl (ref 78.0–100.0)
Platelets: 332 10*3/uL (ref 150.0–400.0)
RBC: 4.52 Mil/uL (ref 3.87–5.11)
RDW: 13.9 % (ref 11.5–15.5)
WBC: 5.4 10*3/uL (ref 4.0–10.5)

## 2017-07-20 LAB — COMPREHENSIVE METABOLIC PANEL
ALT: 18 U/L (ref 0–35)
AST: 20 U/L (ref 0–37)
Albumin: 4.5 g/dL (ref 3.5–5.2)
Alkaline Phosphatase: 59 U/L (ref 39–117)
BUN: 11 mg/dL (ref 6–23)
CO2: 31 mEq/L (ref 19–32)
Calcium: 9.9 mg/dL (ref 8.4–10.5)
Chloride: 101 mEq/L (ref 96–112)
Creatinine, Ser: 0.77 mg/dL (ref 0.40–1.20)
GFR: 80.13 mL/min (ref >60.00)
Glucose, Bld: 92 mg/dL (ref 70–99)
Potassium: 4 mEq/L (ref 3.5–5.1)
Sodium: 140 mEq/L (ref 135–145)
Total Bilirubin: 0.6 mg/dL (ref 0.2–1.2)
Total Protein: 7.8 g/dL (ref 6.0–8.3)

## 2017-07-20 LAB — LIPID PANEL
Cholesterol: 235 mg/dL — ABNORMAL HIGH (ref 0–200)
HDL: 73.7 mg/dL (ref >39.00)
LDL Cholesterol: 149 mg/dL — ABNORMAL HIGH (ref 0–99)
NonHDL: 161.25
Total CHOL/HDL Ratio: 3
Triglycerides: 61 mg/dL (ref 0.0–149.0)
VLDL: 12.2 mg/dL (ref 0.0–40.0)

## 2017-07-20 LAB — TSH: TSH: 1.04 u[IU]/mL (ref 0.35–4.50)

## 2017-07-20 NOTE — Assessment & Plan Note (Signed)
Encouraged heart healthy diet, increase exercise, avoid trans fats, consider a krill oil cap daily 

## 2017-07-20 NOTE — Assessment & Plan Note (Signed)
Encouraged to get adequate exercise, calcium and vitamin d intake 

## 2017-07-20 NOTE — Assessment & Plan Note (Signed)
Follows with Dr Wallis Mart next colonoscopy due in 2020

## 2017-07-20 NOTE — Assessment & Plan Note (Signed)
Pap today, no concerns on exam.  

## 2017-07-20 NOTE — Patient Instructions (Signed)
Bone density shows osteopenia, which is thinner than normal but not as bad as osteoporosis. Recommend calcium intake of 1200 to 1500 mg daily, divided into roughly 3 doses. Best source is the diet and a single dairy serving is about 500 mg, a supplement of calcium citrate once or twice daily to balance diet is fine if not getting enough in diet. Also need Vitamin D 2000 IU caps, 1 cap daily if not already taking vitamin D. Also recommend weight baring exercise on hips and upper body to keep bones strong Preventive Care 40-64 Years, Female Preventive care refers to lifestyle choices and visits with your health care provider that can promote health and wellness. What does preventive care include?  A yearly physical exam. This is also called an annual well check.  Dental exams once or twice a year.  Routine eye exams. Ask your health care provider how often you should have your eyes checked.  Personal lifestyle choices, including: ? Daily care of your teeth and gums. ? Regular physical activity. ? Eating a healthy diet. ? Avoiding tobacco and drug use. ? Limiting alcohol use. ? Practicing safe sex. ? Taking low-dose aspirin daily starting at age 31. ? Taking vitamin and mineral supplements as recommended by your health care provider. What happens during an annual well check? The services and screenings done by your health care provider during your annual well check will depend on your age, overall health, lifestyle risk factors, and family history of disease. Counseling Your health care provider may ask you questions about your:  Alcohol use.  Tobacco use.  Drug use.  Emotional well-being.  Home and relationship well-being.  Sexual activity.  Eating habits.  Work and work Statistician.  Method of birth control.  Menstrual cycle.  Pregnancy history.  Screening You may have the following tests or measurements:  Height, weight, and BMI.  Blood pressure.  Lipid and  cholesterol levels. These may be checked every 5 years, or more frequently if you are over 65 years old.  Skin check.  Lung cancer screening. You may have this screening every year starting at age 30 if you have a 30-pack-year history of smoking and currently smoke or have quit within the past 15 years.  Fecal occult blood test (FOBT) of the stool. You may have this test every year starting at age 47.  Flexible sigmoidoscopy or colonoscopy. You may have a sigmoidoscopy every 5 years or a colonoscopy every 10 years starting at age 96.  Hepatitis C blood test.  Hepatitis B blood test.  Sexually transmitted disease (STD) testing.  Diabetes screening. This is done by checking your blood sugar (glucose) after you have not eaten for a while (fasting). You may have this done every 1-3 years.  Mammogram. This may be done every 1-2 years. Talk to your health care provider about when you should start having regular mammograms. This may depend on whether you have a family history of breast cancer.  BRCA-related cancer screening. This may be done if you have a family history of breast, ovarian, tubal, or peritoneal cancers.  Pelvic exam and Pap test. This may be done every 3 years starting at age 42. Starting at age 67, this may be done every 5 years if you have a Pap test in combination with an HPV test.  Bone density scan. This is done to screen for osteoporosis. You may have this scan if you are at high risk for osteoporosis.  Discuss your test results, treatment options, and  if necessary, the need for more tests with your health care provider. Vaccines Your health care provider may recommend certain vaccines, such as:  Influenza vaccine. This is recommended every year.  Tetanus, diphtheria, and acellular pertussis (Tdap, Td) vaccine. You may need a Td booster every 10 years.  Varicella vaccine. You may need this if you have not been vaccinated.  Zoster vaccine. You may need this after age  27.  Measles, mumps, and rubella (MMR) vaccine. You may need at least one dose of MMR if you were born in 1957 or later. You may also need a second dose.  Pneumococcal 13-valent conjugate (PCV13) vaccine. You may need this if you have certain conditions and were not previously vaccinated.  Pneumococcal polysaccharide (PPSV23) vaccine. You may need one or two doses if you smoke cigarettes or if you have certain conditions.  Meningococcal vaccine. You may need this if you have certain conditions.  Hepatitis A vaccine. You may need this if you have certain conditions or if you travel or work in places where you may be exposed to hepatitis A.  Hepatitis B vaccine. You may need this if you have certain conditions or if you travel or work in places where you may be exposed to hepatitis B.  Haemophilus influenzae type b (Hib) vaccine. You may need this if you have certain conditions.  Talk to your health care provider about which screenings and vaccines you need and how often you need them. This information is not intended to replace advice given to you by your health care provider. Make sure you discuss any questions you have with your health care provider. Document Released: 09/17/2015 Document Revised: 05/10/2016 Document Reviewed: 06/22/2015 Elsevier Interactive Patient Education  2017 [employer].

## 2017-07-20 NOTE — Assessment & Plan Note (Signed)
Check level today 

## 2017-07-20 NOTE — Progress Notes (Signed)
Subjective:  I acted as a Education administrator for Dr. Charlett Blake. [place], [place]  Patient ID: Susan Chang, female    DOB: 04-Jun-1951, 66 y.o.   MRN: [id number]  No chief complaint on file.   HPI  Patient is in today for an annual exam and she is doing well. No recent febrile illness or acute hospitalizations. She denies any gyn concerns. She knows she had both ovaries and tubes removed due to her history of breast cancer but she believes she still has her uterus. Notes she is maintaining a heart healthy diet and stays physically active. Denies CP/palp/SOB/HA/congestion/fevers/GI or GU c/o. Taking meds as prescribed  Patient Care Team: Mosie Lukes, MD as PCP - General (Family Medicine)   Past Medical History:  Diagnosis Date  . Breast cancer ([place]) 2000   right  . Osteopenia   . Preventative health care 07/13/2016  . Vitamin D deficiency 11/14/2015    Past Surgical History:  Procedure Laterality Date  . ABDOMINAL HYSTERECTOMY  2001   TAH b/l SPO for ovarian cyst,  . APPENDECTOMY    . BREAST BIOPSY Right 2000  . BREAST SURGERY  2000   cancer, right lumpectomy  . LYMPH NODE DISSECTION Right 2000    Family History  Problem Relation Age of Onset  . Cancer Mother        Breast cancer  . Hypertension Mother   . Stroke Mother   . Cancer Father        lung cancer  . Cancer Paternal Aunt        breast cancer  . Cancer Brother     Social History   Socioeconomic History  . Marital status: Married    Spouse name: Not on file  . Number of children: Not on file  . Years of education: Not on file  . Highest education level: Not on file  Social Needs  . Financial resource strain: Not on file  . Food insecurity - worry: Not on file  . Food insecurity - inability: Not on file  . Transportation needs - medical: Not on file  . Transportation needs - non-medical: Not on file  Occupational History  . Occupation: Production manager  Tobacco Use  . Smoking status: Never Smoker  .  Smokeless tobacco: Never Used  Substance and Sexual Activity  . Alcohol use: Yes    Alcohol/week: 0.0 oz  . Drug use: No  . Sexual activity: Yes    Comment: lives with husband, drafting and design, no dietary restrictions  Other Topics Concern  . Not on file  Social History Narrative   No major dietary restrictions but eats very little meat   Stays very physically active. Walks daily   Works Acupuncturist    No outpatient medications prior to visit.   No facility-administered medications prior to visit.     No Known Allergies  Review of Systems  Constitutional: Negative for fever and malaise/fatigue.  HENT: Negative for congestion.   Eyes: Negative for blurred vision.  Respiratory: Negative for cough and shortness of breath.   Cardiovascular: Negative for chest pain, palpitations and leg swelling.  Gastrointestinal: Negative for constipation, heartburn and vomiting.  Genitourinary: Negative.  Negative for dysuria.  Musculoskeletal: Negative for back pain.  Skin: Negative for rash.  Neurological: Negative for loss of consciousness and headaches.  Endo/Heme/Allergies: Negative.   Psychiatric/Behavioral: Negative.        Objective:    Physical Exam  Constitutional: She is oriented to person,  place, and time. She appears well-developed and well-nourished. No distress.  HENT:  Head: Normocephalic and atraumatic.  Eyes: Conjunctivae are normal.  Neck: Normal range of motion. No thyromegaly present.  Cardiovascular: Normal rate and regular rhythm.  Pulmonary/Chest: Effort normal and breath sounds normal. She has no wheezes.  Abdominal: Soft. Bowel sounds are normal. There is no tenderness.  Genitourinary: Vagina normal and uterus normal. No vaginal discharge found.  Musculoskeletal: Normal range of motion. She exhibits no edema or deformity.  Neurological: She is alert and oriented to person, place, and time.  Skin: Skin is warm and dry. She is not diaphoretic.    Psychiatric: She has a normal mood and affect.    BP 118/70 (BP Location: Left Arm, Patient Position: Sitting, Cuff Size: Normal)   Pulse 68   Temp 97.6 F (36.4 C) (Oral)   Resp 18   Ht 5' 5.5" (1.664 m)   Wt 156 lb 9.6 oz (71 kg)   SpO2 98%   BMI 25.66 kg/m  Wt Readings from Last 3 Encounters:  07/20/17 156 lb 9.6 oz (71 kg)  02/01/17 152 lb (68.9 kg)  07/13/16 151 lb 4 oz (68.6 kg)   BP Readings from Last 3 Encounters:  07/20/17 118/70  02/01/17 112/67  07/13/16 113/67      There is no immunization history on file for this patient.  Health Maintenance  Topic Date Due  . Hepatitis C Screening  1951/04/18  . HIV Screening  03/22/1968  . TETANUS/TDAP  03/22/1972  . PAP SMEAR  03/22/1974  . INFLUENZA VACCINE  08/20/2017 (Originally 04/04/2017)  . MAMMOGRAM  12/20/2017  . COLONOSCOPY  09/09/2023    Lab Results  Component Value Date   WBC 5.4 07/20/2017   HGB 13.3 07/20/2017   HCT 40.7 07/20/2017   PLT 332.0 07/20/2017   GLUCOSE 92 07/20/2017   CHOL 235 (H) 07/20/2017   TRIG 61.0 07/20/2017   HDL 73.70 07/20/2017   LDLCALC 149 (H) 07/20/2017   ALT 18 07/20/2017   AST 20 07/20/2017   NA 140 07/20/2017   K 4.0 07/20/2017   CL 101 07/20/2017   CREATININE 0.77 07/20/2017   BUN 11 07/20/2017   CO2 31 07/20/2017   TSH 1.04 07/20/2017    Lab Results  Component Value Date   TSH 1.04 07/20/2017   Lab Results  Component Value Date   WBC 5.4 07/20/2017   HGB 13.3 07/20/2017   HCT 40.7 07/20/2017   MCV 90.1 07/20/2017   PLT 332.0 07/20/2017   Lab Results  Component Value Date   NA 140 07/20/2017   K 4.0 07/20/2017   CO2 31 07/20/2017   GLUCOSE 92 07/20/2017   BUN 11 07/20/2017   CREATININE 0.77 07/20/2017   BILITOT 0.6 07/20/2017   ALKPHOS 59 07/20/2017   AST 20 07/20/2017   ALT 18 07/20/2017   PROT 7.8 07/20/2017   ALBUMIN 4.5 07/20/2017   CALCIUM 9.9 07/20/2017   GFR 80.13 07/20/2017   Lab Results  Component Value Date   CHOL 235 (H)  07/20/2017   Lab Results  Component Value Date   HDL 73.70 07/20/2017   Lab Results  Component Value Date   LDLCALC 149 (H) 07/20/2017   Lab Results  Component Value Date   TRIG 61.0 07/20/2017   Lab Results  Component Value Date   CHOLHDL 3 07/20/2017   No results found for: HGBA1C       Assessment & Plan:   Problem List Items Addressed  This Visit    Osteopenia    Encouraged to get adequate exercise, calcium and vitamin d intake      Relevant Orders   Comprehensive metabolic panel (Completed)   Vitamin D deficiency    Check level today      Preventative health care - Primary   Relevant Orders   CBC (Completed)   Comprehensive metabolic panel (Completed)   TSH (Completed)   Cervical cancer screening    Pap today, no concerns on exam.       Relevant Orders   Cytology - PAP   Hyperlipidemia    Encouraged heart healthy diet, increase exercise, avoid trans fats, consider a krill oil cap daily      Relevant Orders   Lipid panel (Completed)   Hx of colonic polyp    Follows with Dr Wallis Mart next colonoscopy due in 2020         Olivia Mackie does not currently have medications on file.  No orders of the defined types were placed in this encounter.   CMA served as Education administrator during this visit. History, Physical and Plan performed by medical provider. Documentation and orders reviewed and attested to.  Penni Homans, MD

## 2017-07-25 LAB — CYTOLOGY - PAP
Diagnosis: NEGATIVE
HPV: NOT DETECTED

## 2017-10-24 ENCOUNTER — Telehealth: Payer: Self-pay | Admitting: Oncology

## 2017-10-24 NOTE — Telephone Encounter (Signed)
Patient called to reschedule  °

## 2017-11-21 DIAGNOSIS — H43813 Vitreous degeneration, bilateral: Secondary | ICD-10-CM | POA: Diagnosis not present

## 2017-11-21 DIAGNOSIS — Z0101 Encounter for examination of eyes and vision with abnormal findings: Secondary | ICD-10-CM | POA: Diagnosis not present

## 2017-11-21 DIAGNOSIS — H25813 Combined forms of age-related cataract, bilateral: Secondary | ICD-10-CM | POA: Diagnosis not present

## 2017-11-29 ENCOUNTER — Telehealth: Payer: Self-pay | Admitting: *Deleted

## 2017-11-29 NOTE — Telephone Encounter (Signed)
Received Physician Orders from [facility]; forwarded to provider/SLS 03/28

## 2017-12-24 DIAGNOSIS — Z8739 Personal history of other diseases of the musculoskeletal system and connective tissue: Secondary | ICD-10-CM | POA: Diagnosis not present

## 2017-12-24 DIAGNOSIS — Z1231 Encounter for screening mammogram for malignant neoplasm of breast: Secondary | ICD-10-CM | POA: Diagnosis not present

## 2017-12-24 DIAGNOSIS — Z803 Family history of malignant neoplasm of breast: Secondary | ICD-10-CM | POA: Diagnosis not present

## 2017-12-24 DIAGNOSIS — Z853 Personal history of malignant neoplasm of breast: Secondary | ICD-10-CM | POA: Diagnosis not present

## 2017-12-24 LAB — HM DEXA SCAN: HM Dexa Scan: NORMAL

## 2017-12-24 LAB — HM MAMMOGRAPHY

## 2017-12-26 ENCOUNTER — Telehealth: Payer: Self-pay | Admitting: *Deleted

## 2017-12-26 NOTE — Telephone Encounter (Signed)
Received Bone Density results from [facility]; forwarded to provider/SLS 04/24

## 2018-01-02 ENCOUNTER — Telehealth: Payer: Self-pay | Admitting: *Deleted

## 2018-01-02 NOTE — Telephone Encounter (Signed)
Received Bone Density results from [facility]; forwarded to provider/SLS 05/01

## 2018-01-04 ENCOUNTER — Encounter: Payer: Self-pay | Admitting: Family Medicine

## 2018-01-04 NOTE — Progress Notes (Signed)
Bone Density SOLIS see report

## 2018-01-10 ENCOUNTER — Encounter: Payer: Self-pay | Admitting: Family Medicine

## 2018-01-10 ENCOUNTER — Ambulatory Visit: Payer: 59 | Admitting: Family Medicine

## 2018-01-10 VITALS — BP 105/60 | HR 70 | Temp 98.3°F | Resp 16 | Ht 66.0 in | Wt 148.6 lb

## 2018-01-10 DIAGNOSIS — M8580 Other specified disorders of bone density and structure, unspecified site: Secondary | ICD-10-CM

## 2018-01-10 DIAGNOSIS — G43909 Migraine, unspecified, not intractable, without status migrainosus: Secondary | ICD-10-CM

## 2018-01-10 DIAGNOSIS — E559 Vitamin D deficiency, unspecified: Secondary | ICD-10-CM | POA: Diagnosis not present

## 2018-01-10 DIAGNOSIS — E782 Mixed hyperlipidemia: Secondary | ICD-10-CM

## 2018-01-10 MED ORDER — SUMATRIPTAN SUCCINATE 50 MG PO TABS: 50.0000 mg | ORAL_TABLET | ORAL | 2 refills | Status: DC | PRN

## 2018-01-10 NOTE — Assessment & Plan Note (Signed)
Occurs monthly for some time but worsening with nausea, photophobia and phonophobia and always on the right side. Encouraged increased hydration, 64 ounces of clear fluids daily. Minimize alcohol and caffeine. Eat small frequent meals with lean proteins and complex carbs. Avoid high and low blood sugars. Get adequate sleep, 7-8 hours a night. Needs exercise daily preferably in the morning. Try Imitrex 50-100 mg prn with  Or without Advil prn as needed. Return if worsens or no response

## 2018-01-10 NOTE — Assessment & Plan Note (Signed)
Encouraged heart healthy diet, increase exercise, avoid trans fats, consider a krill oil cap daily 

## 2018-01-10 NOTE — Patient Instructions (Addendum)
Zyrtec/Cetirzine 10 mg as needed for but bites and cleanse area with Witch Hazel Astringent   Migraine Headache  Hydrate well with at least 64 oz of clear fluids, eat protein and get 6-8 hours  Can take Advil/Motrin/Ibuprofen 200 mg tabs, 1-2 tabs with Imitrex dose as needed  A migraine headache is an intense, throbbing pain on one side or both sides of the head. Migraines may also cause other symptoms, such as nausea, vomiting, and sensitivity to light and noise. What are the causes? Doing or taking certain things may also trigger migraines, such as:  Alcohol.  Smoking.  Medicines, such as: ? Medicine used to treat chest pain (nitroglycerine). ? Birth control pills. ? Estrogen pills. ? Certain blood pressure medicines.  Aged cheeses, chocolate, or caffeine.  Foods or drinks that contain nitrates, glutamate, aspartame, or tyramine.  Physical activity.  Other things that may trigger a migraine include:  Menstruation.  Pregnancy.  Hunger.  Stress, lack of sleep, too much sleep, or fatigue.  Weather changes.  What increases the risk? The following factors may make you more likely to experience migraine headaches:  Age. Risk increases with age.  Family history of migraine headaches.  Being Caucasian.  Depression and anxiety.  Obesity.  Being a woman.  Having a hole in the heart (patent foramen ovale) or other heart problems.  What are the signs or symptoms? The main symptom of this condition is pulsating or throbbing pain. Pain may:  Happen in any area of the head, such as on one side or both sides.  Interfere with daily activities.  Get worse with physical activity.  Get worse with exposure to bright lights or loud noises.  Other symptoms may include:  Nausea.  Vomiting.  Dizziness.  General sensitivity to bright lights, loud noises, or smells.  Before you get a migraine, you may get warning signs that a migraine is developing (aura). An aura  may include:  Seeing flashing lights or having blind spots.  Seeing bright spots, halos, or zigzag lines.  Having tunnel vision or blurred vision.  Having numbness or a tingling feeling.  Having trouble talking.  Having muscle weakness.  How is this diagnosed? A migraine headache can be diagnosed based on:  Your symptoms.  A physical exam.  Tests, such as CT scan or MRI of the head. These imaging tests can help rule out other causes of headaches.  Taking fluid from the spine (lumbar puncture) and analyzing it (cerebrospinal fluid analysis, or CSF analysis).  How is this treated? A migraine headache is usually treated with medicines that:  Relieve pain.  Relieve nausea.  Prevent migraines from coming back.  Treatment may also include:  Acupuncture.  Lifestyle changes like avoiding foods that trigger migraines.  Follow these instructions at home: Medicines  Take over-the-counter and prescription medicines only as told by your health care provider.  Do not drive or use heavy machinery while taking prescription pain medicine.  To prevent or treat constipation while you are taking prescription pain medicine, your health care provider may recommend that you: ? Drink enough fluid to keep your urine clear or pale yellow. ? Take over-the-counter or prescription medicines. ? Eat foods that are high in fiber, such as fresh fruits and vegetables, whole grains, and beans. ? Limit foods that are high in fat and processed sugars, such as fried and sweet foods. Lifestyle  Avoid alcohol use.  Do not use any products that contain nicotine or tobacco, such as cigarettes and e-cigarettes.  If you need help quitting, ask your health care provider.  Get at least 8 hours of sleep every night.  Limit your stress. General instructions   Keep a journal to find out what may trigger your migraine headaches. For example, write down: ? What you eat and drink. ? How much sleep you  get. ? Any change to your diet or medicines.  If you have a migraine: ? Avoid things that make your symptoms worse, such as bright lights. ? It may help to lie down in a dark, quiet room. ? Do not drive or use heavy machinery. ? Ask your health care provider what activities are safe for you while you are experiencing symptoms.  Keep all follow-up visits as told by your health care provider. This is important. Contact a health care provider if:  You develop symptoms that are different or more severe than your usual migraine symptoms. Get help right away if:  Your migraine becomes severe.  You have a fever.  You have a stiff neck.  You have vision loss.  Your muscles feel weak or like you cannot control them.  You start to lose your balance often.  You develop trouble walking.  You faint. This information is not intended to replace advice given to you by your health care provider. Make sure you discuss any questions you have with your health care provider. Document Released: 08/21/2005 Document Revised: 03/10/2016 Document Reviewed: 02/07/2016 Elsevier Interactive Patient Education  2017 [employer].

## 2018-01-10 NOTE — Progress Notes (Signed)
Subjective:  I acted as a Education administrator for [employer]. Yancey Flemings, [place]   Patient ID: Susan Chang, female    DOB: 10-25-1950, 67 y.o.   MRN: [id number]  Chief Complaint  Patient presents with  . Headache    HPI  Patient is in today for recurring headaches. Occurs monthly for some time but worsening with nausea, photophobia and phonophobia and always on the right side over the past week. She acknowledges that they may occur more when she is dehydrated from working outside in the sun. No other neurologic concerns. Denies CP/palp/SOB/congestion/fevers or GU c/o. Taking meds as prescribed  Patient Care Team: Mosie Lukes, MD as PCP - General (Family Medicine)   Past Medical History:  Diagnosis Date  . Breast cancer ([place]) 2000   right  . Osteopenia   . Preventative health care 07/13/2016  . Vitamin D deficiency 11/14/2015    Past Surgical History:  Procedure Laterality Date  . ABDOMINAL HYSTERECTOMY  2001   TAH b/l SPO for ovarian cyst,  . APPENDECTOMY    . BREAST BIOPSY Right 2000  . BREAST SURGERY  2000   cancer, right lumpectomy  . LYMPH NODE DISSECTION Right 2000    Family History  Problem Relation Age of Onset  . Cancer Mother        Breast cancer  . Hypertension Mother   . Stroke Mother   . Cancer Father        lung cancer  . Cancer Paternal Aunt        breast cancer  . Cancer Brother     Social History   Socioeconomic History  . Marital status: Married    Spouse name: Not on file  . Number of children: Not on file  . Years of education: Not on file  . Highest education level: Not on file  Occupational History  . Occupation: Production manager  Social Needs  . Financial resource strain: Not on file  . Food insecurity:    Worry: Not on file    Inability: Not on file  . Transportation needs:    Medical: Not on file    Non-medical: Not on file  Tobacco Use  . Smoking status: Never Smoker  . Smokeless tobacco: Never Used  Substance and Sexual  Activity  . Alcohol use: Yes    Alcohol/week: 0.0 oz  . Drug use: No  . Sexual activity: Yes    Comment: lives with husband, drafting and design, no dietary restrictions  Lifestyle  . Physical activity:    Days per week: Not on file    Minutes per session: Not on file  . Stress: Not on file  Relationships  . Social connections:    Talks on phone: Not on file    Gets together: Not on file    Attends religious service: Not on file    Active member of club or organization: Not on file    Attends meetings of clubs or organizations: Not on file    Relationship status: Not on file  . Intimate partner violence:    Fear of current or ex partner: Not on file    Emotionally abused: Not on file    Physically abused: Not on file    Forced sexual activity: Not on file  Other Topics Concern  . Not on file  Social History Narrative   No major dietary restrictions but eats very little meat   Stays very physically active. Walks daily   Works Teaching laboratory technician  drafting    No outpatient medications prior to visit.   No facility-administered medications prior to visit.     No Known Allergies  Review of Systems  Constitutional: Negative for fever and malaise/fatigue.  HENT: Negative for congestion.   Eyes: Positive for photophobia. Negative for blurred vision.  Respiratory: Negative for shortness of breath.   Cardiovascular: Negative for chest pain, palpitations and leg swelling.  Gastrointestinal: Positive for nausea. Negative for abdominal pain and blood in stool.  Genitourinary: Negative for dysuria and frequency.  Musculoskeletal: Negative for falls.  Skin: Negative for rash.  Neurological: Positive for headaches. Negative for dizziness and loss of consciousness.  Endo/Heme/Allergies: Negative for environmental allergies.  Psychiatric/Behavioral: Negative for depression. The patient is not nervous/anxious.        Objective:    Physical Exam  Constitutional: She is oriented to person,  place, and time. No distress.  HENT:  Left Ear: External ear normal.  Mouth/Throat: No oropharyngeal exudate.  Eyes: EOM are normal. Left eye exhibits no discharge. No scleral icterus.  Neck: No JVD present. Tracheal deviation present.  Cardiovascular: Normal heart sounds and intact distal pulses.  Pulmonary/Chest: No respiratory distress. She has no rales.  Abdominal: She exhibits no distension and no mass. There is no tenderness. There is no guarding.  Musculoskeletal: She exhibits no edema or tenderness.  Lymphadenopathy:    She has no cervical adenopathy.  Neurological: She is alert and oriented to person, place, and time. She has normal strength. She is not disoriented. She displays normal reflexes. No cranial nerve deficit or sensory deficit. Coordination normal.  Skin: No rash noted. No erythema.    BP 105/60 (BP Location: Left Arm, Patient Position: Sitting, Cuff Size: Normal)   Pulse 70   Temp 98.3 F (36.8 C) (Oral)   Resp 16   Ht 5\' 6"  (1.676 m)   Wt 148 lb 9.6 oz (67.4 kg)   SpO2 99%   BMI 23.98 kg/m  Wt Readings from Last 3 Encounters:  01/10/18 148 lb 9.6 oz (67.4 kg)  07/20/17 156 lb 9.6 oz (71 kg)  02/01/17 152 lb (68.9 kg)   BP Readings from Last 3 Encounters:  01/10/18 105/60  07/20/17 118/70  02/01/17 112/67      There is no immunization history on file for this patient.  Health Maintenance  Topic Date Due  . Hepatitis C Screening  31-May-1951  . HIV Screening  03/22/1968  . TETANUS/TDAP  03/22/1972  . INFLUENZA VACCINE  04/04/2018  . MAMMOGRAM  12/22/2018  . PAP SMEAR  07/20/2020  . COLONOSCOPY  09/09/2023    Lab Results  Component Value Date   WBC 5.4 07/20/2017   HGB 13.3 07/20/2017   HCT 40.7 07/20/2017   PLT 332.0 07/20/2017   GLUCOSE 92 07/20/2017   CHOL 235 (H) 07/20/2017   TRIG 61.0 07/20/2017   HDL 73.70 07/20/2017   LDLCALC 149 (H) 07/20/2017   ALT 18 07/20/2017   AST 20 07/20/2017   NA 140 07/20/2017   K 4.0 07/20/2017   CL  101 07/20/2017   CREATININE 0.77 07/20/2017   BUN 11 07/20/2017   CO2 31 07/20/2017   TSH 1.04 07/20/2017    Lab Results  Component Value Date   TSH 1.04 07/20/2017   Lab Results  Component Value Date   WBC 5.4 07/20/2017   HGB 13.3 07/20/2017   HCT 40.7 07/20/2017   MCV 90.1 07/20/2017   PLT 332.0 07/20/2017   Lab Results  Component Value  Date   NA 140 07/20/2017   K 4.0 07/20/2017   CO2 31 07/20/2017   GLUCOSE 92 07/20/2017   BUN 11 07/20/2017   CREATININE 0.77 07/20/2017   BILITOT 0.6 07/20/2017   ALKPHOS 59 07/20/2017   AST 20 07/20/2017   ALT 18 07/20/2017   PROT 7.8 07/20/2017   ALBUMIN 4.5 07/20/2017   CALCIUM 9.9 07/20/2017   GFR 80.13 07/20/2017   Lab Results  Component Value Date   CHOL 235 (H) 07/20/2017   Lab Results  Component Value Date   HDL 73.70 07/20/2017   Lab Results  Component Value Date   LDLCALC 149 (H) 07/20/2017   Lab Results  Component Value Date   TRIG 61.0 07/20/2017   Lab Results  Component Value Date   CHOLHDL 3 07/20/2017   No results found for: HGBA1C       Assessment & Plan:   Problem List Items Addressed This Visit    Osteopenia - Primary   Vitamin D deficiency    Vitamin D check today      Relevant Orders   Comprehensive metabolic panel   TSH   VITAMIN D 25 Hydroxy (Vit-D Deficiency, Fractures)   Hyperlipidemia    Encouraged heart healthy diet, increase exercise, avoid trans fats, consider a krill oil cap daily      Relevant Orders   Lipid panel   TSH   Migraine    Occurs monthly for some time but worsening with nausea, photophobia and phonophobia and always on the right side. Encouraged increased hydration, 64 ounces of clear fluids daily. Minimize alcohol and caffeine. Eat small frequent meals with lean proteins and complex carbs. Avoid high and low blood sugars. Get adequate sleep, 7-8 hours a night. Needs exercise daily preferably in the morning. Try Imitrex 50-100 mg prn with  Or without Advil prn  as needed. Return if worsens or no response      Relevant Medications   SUMAtriptan (IMITREX) 50 MG tablet      I am having Kandyce Rud. [place] start on SUMAtriptan.  Meds ordered this encounter  Medications  . SUMAtriptan (IMITREX) 50 MG tablet    Sig: Take 1-2 tablets (50-100 mg total) by mouth every 2 (two) hours as needed for migraine (max of 2 dosea day.). May repeat in 2 hours if headache persists or recurs.    Dispense:  10 tablet    Refill:  2    CMA served as scribe during this visit. History, Physical and Plan performed by medical provider. Documentation and orders reviewed and attested to.  Penni Homans, MD

## 2018-01-10 NOTE — Assessment & Plan Note (Signed)
Vitamin D check today.  

## 2018-01-11 LAB — LIPID PANEL
Cholesterol: 203 mg/dL — ABNORMAL HIGH (ref 0–200)
HDL: 64.7 mg/dL (ref >39.00)
LDL Cholesterol: 119 mg/dL — ABNORMAL HIGH (ref 0–99)
NonHDL: 137.91
Total CHOL/HDL Ratio: 3
Triglycerides: 96 mg/dL (ref 0.0–149.0)
VLDL: 19.2 mg/dL (ref 0.0–40.0)

## 2018-01-11 LAB — COMPREHENSIVE METABOLIC PANEL
ALT: 14 U/L (ref 0–35)
AST: 17 U/L (ref 0–37)
Albumin: 4.5 g/dL (ref 3.5–5.2)
Alkaline Phosphatase: 50 U/L (ref 39–117)
BUN: 16 mg/dL (ref 6–23)
CO2: 27 mEq/L (ref 19–32)
Calcium: 9.7 mg/dL (ref 8.4–10.5)
Chloride: 103 mEq/L (ref 96–112)
Creatinine, Ser: 0.81 mg/dL (ref 0.40–1.20)
GFR: 75.47 mL/min (ref >60.00)
Glucose, Bld: 87 mg/dL (ref 70–99)
Potassium: 4.1 mEq/L (ref 3.5–5.1)
Sodium: 139 mEq/L (ref 135–145)
Total Bilirubin: 0.5 mg/dL (ref 0.2–1.2)
Total Protein: 7.5 g/dL (ref 6.0–8.3)

## 2018-01-11 LAB — TSH: TSH: 0.77 u[IU]/mL (ref 0.35–4.50)

## 2018-01-11 LAB — VITAMIN D 25 HYDROXY (VIT D DEFICIENCY, FRACTURES): VITD: 27.6 ng/mL — ABNORMAL LOW (ref 30.00–100.00)

## 2018-01-11 MED ORDER — VITAMIN D (ERGOCALCIFEROL) 1.25 MG (50000 UNIT) PO CAPS: 50000.0000 [IU] | ORAL_CAPSULE | ORAL | 4 refills | Status: DC

## 2018-01-15 ENCOUNTER — Encounter: Payer: Self-pay | Admitting: Family Medicine

## 2018-01-17 ENCOUNTER — Encounter: Payer: Self-pay | Admitting: Family Medicine

## 2018-01-21 ENCOUNTER — Ambulatory Visit (HOSPITAL_BASED_OUTPATIENT_CLINIC_OR_DEPARTMENT_OTHER)
Admission: RE | Admit: 2018-01-21 | Discharge: 2018-01-21 | Disposition: A | Discharge status: Home / Self Care | Payer: 59 | Source: Ambulatory Visit | Attending: Family Medicine | Admitting: Family Medicine

## 2018-01-21 ENCOUNTER — Ambulatory Visit: Payer: 59 | Admitting: Family Medicine

## 2018-01-21 ENCOUNTER — Encounter: Payer: Self-pay | Admitting: Family Medicine

## 2018-01-21 VITALS — BP 104/64 | HR 72 | Temp 98.5°F | Resp 18 | Wt 149.0 lb

## 2018-01-21 DIAGNOSIS — M25551 Pain in right hip: Secondary | ICD-10-CM | POA: Diagnosis not present

## 2018-01-21 DIAGNOSIS — E782 Mixed hyperlipidemia: Secondary | ICD-10-CM

## 2018-01-21 DIAGNOSIS — M5441 Lumbago with sciatica, right side: Secondary | ICD-10-CM

## 2018-01-21 DIAGNOSIS — K8020 Calculus of gallbladder without cholecystitis without obstruction: Secondary | ICD-10-CM | POA: Diagnosis not present

## 2018-01-21 DIAGNOSIS — R109 Unspecified abdominal pain: Secondary | ICD-10-CM

## 2018-01-21 DIAGNOSIS — R1011 Right upper quadrant pain: Secondary | ICD-10-CM | POA: Insufficient documentation

## 2018-01-21 DIAGNOSIS — R932 Abnormal findings on diagnostic imaging of liver and biliary tract: Secondary | ICD-10-CM | POA: Insufficient documentation

## 2018-01-21 DIAGNOSIS — M25552 Pain in left hip: Secondary | ICD-10-CM | POA: Diagnosis not present

## 2018-01-21 LAB — URINALYSIS, ROUTINE W REFLEX MICROSCOPIC
Bilirubin Urine: NEGATIVE
Ketones, ur: NEGATIVE
Leukocytes, UA: NEGATIVE
Nitrite: NEGATIVE
Specific Gravity, Urine: 1.025 (ref 1.000–1.030)
Total Protein, Urine: NEGATIVE
Urine Glucose: NEGATIVE
Urobilinogen, UA: 0.2 (ref 0.0–1.0)
WBC, UA: NONE SEEN (ref 0–?)
pH: 5 (ref 5.0–8.0)

## 2018-01-21 LAB — CBC WITH DIFFERENTIAL/PLATELET
Basophils Absolute: 0.1 10*3/uL (ref 0.0–0.1)
Basophils Relative: 1 % (ref 0.0–3.0)
Eosinophils Absolute: 0.1 10*3/uL (ref 0.0–0.7)
Eosinophils Relative: 1.2 % (ref 0.0–5.0)
HCT: 37.4 % (ref 36.0–46.0)
Hemoglobin: 12.2 g/dL (ref 12.0–15.0)
Lymphocytes Relative: 38.2 % (ref 12.0–46.0)
Lymphs Abs: 2 10*3/uL (ref 0.7–4.0)
MCHC: 32.7 g/dL (ref 30.0–36.0)
MCV: 90.7 fl (ref 78.0–100.0)
Monocytes Absolute: 0.5 10*3/uL (ref 0.1–1.0)
Monocytes Relative: 9 % (ref 3.0–12.0)
Neutro Abs: 2.7 10*3/uL (ref 1.4–7.7)
Neutrophils Relative %: 50.6 % (ref 43.0–77.0)
Platelets: 302 10*3/uL (ref 150.0–400.0)
RBC: 4.12 Mil/uL (ref 3.87–5.11)
RDW: 13.7 % (ref 11.5–15.5)
WBC: 5.3 10*3/uL (ref 4.0–10.5)

## 2018-01-21 LAB — SEDIMENTATION RATE: Sed Rate: 6 mm/hr (ref 0–30)

## 2018-01-21 LAB — C-REACTIVE PROTEIN: CRP: 0.3 mg/dL — ABNORMAL LOW (ref 0.5–20.0)

## 2018-01-21 MED ORDER — TIZANIDINE HCL 4 MG PO TABS: 4.0000 mg | ORAL_TABLET | Freq: Four times a day (QID) | ORAL | 0 refills | Status: DC | PRN

## 2018-01-21 NOTE — Progress Notes (Signed)
Subjective:  I acted as a Education administrator for Dr. Charlett Blake. [place], [place]  Patient ID: Susan Chang, female    DOB: [birth year], 67 y.o.   MRN: [id number]  No chief complaint on file.   HPI  Patient is in today for an acute visit for stomach pain. She states there is a "pulling feeling". No vomiting, diarrhea, or nausea. Does note some mild urinary frequency. No fever or chills. Denies CP/palp/SOB/HA/congestion/fevers or GU c/o. Taking meds as prescribed  Patient Care Team: Mosie Lukes, MD as PCP - General (Family Medicine)   Past Medical History:  Diagnosis Date  . Breast cancer ([place]) 2000   right  . Osteopenia   . Preventative health care 07/13/2016  . Vitamin D deficiency 11/14/2015    Past Surgical History:  Procedure Laterality Date  . ABDOMINAL HYSTERECTOMY  2001   TAH b/l SPO for ovarian cyst,  . APPENDECTOMY    . BREAST BIOPSY Right 2000  . BREAST SURGERY  2000   cancer, right lumpectomy  . LYMPH NODE DISSECTION Right 2000    Family History  Problem Relation Age of Onset  . Cancer Mother        Breast cancer  . Hypertension Mother   . Stroke Mother   . Cancer Father        lung cancer  . Cancer Paternal Aunt        breast cancer  . Cancer Brother     Social History   Socioeconomic History  . Marital status: Married    Spouse name: Not on file  . Number of children: Not on file  . Years of education: Not on file  . Highest education level: Not on file  Occupational History  . Occupation: Production manager  Social Needs  . Financial resource strain: Not on file  . Food insecurity:    Worry: Not on file    Inability: Not on file  . Transportation needs:    Medical: Not on file    Non-medical: Not on file  Tobacco Use  . Smoking status: Never Smoker  . Smokeless tobacco: Never Used  Substance and Sexual Activity  . Alcohol use: Yes    Alcohol/week: 0.0 oz  . Drug use: No  . Sexual activity: Yes    Comment: lives with husband, drafting and  design, no dietary restrictions  Lifestyle  . Physical activity:    Days per week: Not on file    Minutes per session: Not on file  . Stress: Not on file  Relationships  . Social connections:    Talks on phone: Not on file    Gets together: Not on file    Attends religious service: Not on file    Active member of club or organization: Not on file    Attends meetings of clubs or organizations: Not on file    Relationship status: Not on file  . Intimate partner violence:    Fear of current or ex partner: Not on file    Emotionally abused: Not on file    Physically abused: Not on file    Forced sexual activity: Not on file  Other Topics Concern  . Not on file  Social History Narrative   No major dietary restrictions but eats very little meat   Stays very physically active. Walks daily   Works Acupuncturist    Outpatient Medications Prior to Visit  Medication Sig Dispense Refill  . SUMAtriptan (IMITREX) 50 MG tablet Take  1-2 tablets (50-100 mg total) by mouth every 2 (two) hours as needed for migraine (max of 2 dosea day.). May repeat in 2 hours if headache persists or recurs. 10 tablet 2  . Vitamin D, Ergocalciferol, (DRISDOL) 50000 units CAPS capsule Take 1 capsule (50,000 Units total) by mouth every 7 (seven) days. 4 capsule 4   No facility-administered medications prior to visit.     No Known Allergies  Review of Systems  Constitutional: Negative for fever and malaise/fatigue.  HENT: Negative for congestion.   Eyes: Negative for blurred vision.  Respiratory: Negative for shortness of breath.   Cardiovascular: Negative for chest pain, palpitations and leg swelling.  Gastrointestinal: Positive for abdominal pain. Negative for blood in stool and nausea.  Genitourinary: Negative for dysuria and frequency.  Musculoskeletal: Positive for back pain and joint pain. Negative for falls.  Skin: Negative for rash.  Neurological: Negative for dizziness, loss of consciousness and  headaches.  Endo/Heme/Allergies: Negative for environmental allergies.  Psychiatric/Behavioral: Negative for depression. The patient is not nervous/anxious.        Objective:    Physical Exam  Constitutional: She is oriented to person, place, and time. No distress.  HENT:  Head: Normocephalic and atraumatic.  Eyes: Conjunctivae are normal.  Neck: Neck supple. No thyromegaly present.  Cardiovascular: Normal rate, regular rhythm and normal heart sounds.  No murmur heard. Pulmonary/Chest: Effort normal and breath sounds normal. She has no wheezes.  Abdominal: She exhibits no distension and no mass.  Musculoskeletal: She exhibits no edema.  Lymphadenopathy:    She has no cervical adenopathy.  Neurological: She is alert and oriented to person, place, and time.  Skin: Skin is warm and dry. No rash noted. She is not diaphoretic.  Psychiatric: Judgment normal.    BP 104/64 (BP Location: Left Arm, Patient Position: Sitting, Cuff Size: Normal)   Pulse 72   Temp 98.5 F (36.9 C) (Oral)   Resp 18   Wt 149 lb (67.6 kg)   SpO2 97%   BMI 24.05 kg/m  Wt Readings from Last 3 Encounters:  01/21/18 149 lb (67.6 kg)  01/10/18 148 lb 9.6 oz (67.4 kg)  07/20/17 156 lb 9.6 oz (71 kg)   BP Readings from Last 3 Encounters:  01/21/18 104/64  01/10/18 105/60  07/20/17 118/70      There is no immunization history on file for this patient.  Health Maintenance  Topic Date Due  . Hepatitis C Screening  [birth year]  . HIV Screening  03/22/1968  . TETANUS/TDAP  03/22/1972  . INFLUENZA VACCINE  04/04/2018  . MAMMOGRAM  12/25/2019  . PAP SMEAR  07/20/2020  . COLONOSCOPY  09/09/2023    Lab Results  Component Value Date   WBC 5.3 01/21/2018   HGB 12.2 01/21/2018   HCT 37.4 01/21/2018   PLT 302.0 01/21/2018   GLUCOSE 87 01/10/2018   CHOL 203 (H) 01/10/2018   TRIG 96.0 01/10/2018   HDL 64.70 01/10/2018   LDLCALC 119 (H) 01/10/2018   ALT 14 01/10/2018   AST 17 01/10/2018   NA 139  01/10/2018   K 4.1 01/10/2018   CL 103 01/10/2018   CREATININE 0.81 01/10/2018   BUN 16 01/10/2018   CO2 27 01/10/2018   TSH 0.77 01/10/2018    Lab Results  Component Value Date   TSH 0.77 01/10/2018   Lab Results  Component Value Date   WBC 5.3 01/21/2018   HGB 12.2 01/21/2018   HCT 37.4 01/21/2018   MCV 90.7  01/21/2018   PLT 302.0 01/21/2018   Lab Results  Component Value Date   NA 139 01/10/2018   K 4.1 01/10/2018   CO2 27 01/10/2018   GLUCOSE 87 01/10/2018   BUN 16 01/10/2018   CREATININE 0.81 01/10/2018   BILITOT 0.5 01/10/2018   ALKPHOS 50 01/10/2018   AST 17 01/10/2018   ALT 14 01/10/2018   PROT 7.5 01/10/2018   ALBUMIN 4.5 01/10/2018   CALCIUM 9.7 01/10/2018   GFR 75.47 01/10/2018   Lab Results  Component Value Date   CHOL 203 (H) 01/10/2018   Lab Results  Component Value Date   HDL 64.70 01/10/2018   Lab Results  Component Value Date   LDLCALC 119 (H) 01/10/2018   Lab Results  Component Value Date   TRIG 96.0 01/10/2018   Lab Results  Component Value Date   CHOLHDL 3 01/10/2018   No results found for: HGBA1C       Assessment & Plan:   Problem List Items Addressed This Visit    Hyperlipidemia    Encouraged heart healthy diet, increase exercise, avoid trans fats, consider a krill oil cap daily      Abdominal pain    Check a complete ultrasound. Labs are unremarkable. Avoid offending foods. Labs order and reviewed and no obvious cause found.      Relevant Orders   Urinalysis   Urine Culture (Completed)   Right hip pain    With low back pain and radicular symptoms down her right leg at times. Xray unremarkable. Encouraged moist heat and gentle stretching as tolerated. May try NSAIDs and prescription meds as directed and report if symptoms worsen or seek immediate care. She will let know if symptoms worsen or do not resolve for further evalaution      Relevant Orders   DG HIPS BILAT W OR W/O PELVIS 2V (Completed)    Other Visit  Diagnoses    RUQ cramping    -  Primary   Relevant Orders   US Abdomen Complete (Completed)   CBC with Differential/Platelet (Completed)   Sedimentation rate (Completed)   C-reactive protein (Completed)   Acute right-sided low back pain with right-sided sciatica       Relevant Medications   tiZANidine (ZANAFLEX) 4 MG tablet   Other Relevant Orders   DG HIPS BILAT W OR W/O PELVIS 2V (Completed)      I am having Kandyce Rud. [place] start on tiZANidine. I am also having her maintain her SUMAtriptan and Vitamin D (Ergocalciferol).  Meds ordered this encounter  Medications  . tiZANidine (ZANAFLEX) 4 MG tablet    Sig: Take 1 tablet (4 mg total) by mouth every 6 (six) hours as needed for muscle spasms.    Dispense:  30 tablet    Refill:  0   CMA served as scribe during this visit. History, Physical and Plan performed by medical provider. Documentation and orders reviewed and attested to.  Penni Homans, MD

## 2018-01-21 NOTE — Patient Instructions (Signed)
Lidocaine patches to numb the area. Moist heat  Low-Fat Diet for Pancreatitis or Gallbladder Conditions A low-fat diet can be helpful if you have pancreatitis or a gallbladder condition. With these conditions, your pancreas and gallbladder have trouble digesting fats. A healthy eating plan with less fat will help rest your pancreas and gallbladder and reduce your symptoms. What do I need to know about this diet?  Eat a low-fat diet. ? Reduce your fat intake to less than 20-30% of your total daily calories. This is less than 50-60 g of fat per day. ? Remember that you need some fat in your diet. Ask your dietician what your daily goal should be. ? Choose nonfat and low-fat healthy foods. Look for the words "nonfat," "low fat," or "fat free." ? As a guide, look on the label and choose foods with less than 3 g of fat per serving. Eat only one serving.  Avoid alcohol.  Do not smoke. If you need help quitting, talk with your health care provider.  Eat small frequent meals instead of three large heavy meals. What foods can I eat? Grains Include healthy grains and starches such as potatoes, wheat bread, fiber-rich cereal, and brown rice. Choose whole grain options whenever possible. In adults, whole grains should account for 45-65% of your daily calories. Fruits and Vegetables Eat plenty of fruits and vegetables. Fresh fruits and vegetables add fiber to your diet. Meats and Other Protein Sources Eat lean meat such as chicken and pork. Trim any fat off of meat before cooking it. Eggs, fish, and beans are other sources of protein. In adults, these foods should account for 10-35% of your daily calories. Dairy Choose low-fat milk and dairy options. Dairy includes fat and protein, as well as calcium. Fats and Oils Limit high-fat foods such as fried foods, sweets, baked goods, sugary drinks. Other Creamy sauces and condiments, such as mayonnaise, can add extra fat. Think about whether or not you need  to use them, or use smaller amounts or low fat options. What foods are not recommended?  High fat foods, such as: ? [employer]. ? Ice cream. ? [place] toast. ? Sweet rolls. ? Pizza. ? Cheese bread. ? Foods covered with batter, butter, creamy sauces, or cheese. ? Fried foods. ? Sugary drinks and desserts.  Foods that cause gas or bloating This information is not intended to replace advice given to you by your health care provider. Make sure you discuss any questions you have with your health care provider. Document Released: 08/26/2013 Document Revised: 01/27/2016 Document Reviewed: 08/04/2013 Elsevier Interactive Patient Education  2017 [employer].

## 2018-01-22 LAB — URINE CULTURE
MICRO NUMBER:: 90610273
Result:: NO GROWTH
SPECIMEN QUALITY:: ADEQUATE

## 2018-01-23 ENCOUNTER — Encounter: Payer: Self-pay | Admitting: Family Medicine

## 2018-01-24 ENCOUNTER — Encounter: Payer: Self-pay | Admitting: Family Medicine

## 2018-01-28 DIAGNOSIS — R109 Unspecified abdominal pain: Secondary | ICD-10-CM | POA: Insufficient documentation

## 2018-01-28 DIAGNOSIS — M25551 Pain in right hip: Secondary | ICD-10-CM | POA: Insufficient documentation

## 2018-01-28 NOTE — Assessment & Plan Note (Signed)
With low back pain and radicular symptoms down her right leg at times. Xray unremarkable. Encouraged moist heat and gentle stretching as tolerated. May try NSAIDs and prescription meds as directed and report if symptoms worsen or seek immediate care. She will let know if symptoms worsen or do not resolve for further evalaution

## 2018-01-28 NOTE — Assessment & Plan Note (Signed)
Encouraged heart healthy diet, increase exercise, avoid trans fats, consider a krill oil cap daily 

## 2018-01-28 NOTE — Assessment & Plan Note (Signed)
Check a complete ultrasound. Labs are unremarkable. Avoid offending foods. Labs order and reviewed and no obvious cause found.

## 2018-02-08 ENCOUNTER — Ambulatory Visit: Payer: 59 | Admitting: Oncology

## 2018-03-21 ENCOUNTER — Inpatient Hospital Stay: Payer: 59 | Attending: Oncology | Admitting: Oncology

## 2018-03-21 ENCOUNTER — Telehealth: Payer: Self-pay

## 2018-03-21 VITALS — BP 116/69 | HR 70 | Temp 97.7°F | Resp 18 | Ht 66.0 in | Wt 146.4 lb

## 2018-03-21 DIAGNOSIS — Z171 Estrogen receptor negative status [ER-]: Secondary | ICD-10-CM

## 2018-03-21 DIAGNOSIS — M8580 Other specified disorders of bone density and structure, unspecified site: Secondary | ICD-10-CM | POA: Insufficient documentation

## 2018-03-21 DIAGNOSIS — Z853 Personal history of malignant neoplasm of breast: Secondary | ICD-10-CM | POA: Diagnosis not present

## 2018-03-21 DIAGNOSIS — C50911 Malignant neoplasm of unspecified site of right female breast: Secondary | ICD-10-CM

## 2018-03-21 NOTE — Telephone Encounter (Signed)
Printed avs and calender of upcoming appointment. Per 7/18 los 

## 2018-03-21 NOTE — Progress Notes (Signed)
  [facility] OFFICE PROGRESS NOTE   Diagnosis: Breast cancer  INTERVAL HISTORY:    Ms.Mauss turns as scheduled.  A bilateral mammogram on 12/24/2017 was negative.  Good appetite and energy level.  She is working.  She had an episode of right upper quadrant discomfort when standing a few months ago.  An abdominal ultrasound revealed gallbladder sludge and stones, but no evidence of acute cholecystitis.  The pain has resolved.  She was prescribed Imitrex for intermittent headaches, but has not used this.   Objective:  Vital signs in last 24 hours:  Blood pressure 116/69, pulse 70, temperature 97.7 F (36.5 C), temperature source Oral, resp. rate 18, height 5\' 6"  (1.676 m), weight 146 lb 6.4 oz (66.4 kg), SpO2 100 %.    HEENT: Neck without mass Lymphatics: No cervical, supraclavicular, or axillary nodes Resp: Lungs clear bilaterally Cardio: Regular rate and rhythm GI: No hepatomegaly, nontender Vascular: No leg edema Breast: Status post right lumpectomy.  No mass in either breast.  There are areas of superficial nodular appearing fibroglandular tissue at the upper breast bilaterally.   Medications: I have reviewed the patient's current medications.   Assessment/Plan: 1. 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 2. History of osteopenia - Normal bone density scan 12/24/2017   Disposition: She remains in clinical remission from breast cancer.  She would like to [facility] follow[facility]up at the Cancer center.  She will be scheduled for a mammogram in April 2020.  She will return for an office visit in 1 year.  Betsy Coder, MD  03/21/2018  8:13 AM

## 2018-07-25 ENCOUNTER — Other Ambulatory Visit: Payer: Self-pay | Admitting: Family Medicine

## 2018-07-25 ENCOUNTER — Encounter: Payer: Self-pay | Admitting: Family Medicine

## 2018-07-25 MED ORDER — NYSTATIN-TRIAMCINOLONE 100000-0.1 UNIT/GM-% EX OINT: 1.0000 "application " | TOPICAL_OINTMENT | Freq: Two times a day (BID) | CUTANEOUS | 1 refills | Status: DC | PRN

## 2018-07-26 ENCOUNTER — Ambulatory Visit (INDEPENDENT_AMBULATORY_CARE_PROVIDER_SITE_OTHER): Payer: 59 | Admitting: Family Medicine

## 2018-07-26 ENCOUNTER — Encounter: Payer: Self-pay | Admitting: Family Medicine

## 2018-07-26 DIAGNOSIS — G43909 Migraine, unspecified, not intractable, without status migrainosus: Secondary | ICD-10-CM

## 2018-07-26 DIAGNOSIS — M8580 Other specified disorders of bone density and structure, unspecified site: Secondary | ICD-10-CM

## 2018-07-26 DIAGNOSIS — E559 Vitamin D deficiency, unspecified: Secondary | ICD-10-CM | POA: Diagnosis not present

## 2018-07-26 DIAGNOSIS — Z0000 Encounter for general adult medical examination without abnormal findings: Secondary | ICD-10-CM

## 2018-07-26 DIAGNOSIS — E782 Mixed hyperlipidemia: Secondary | ICD-10-CM | POA: Diagnosis not present

## 2018-07-26 LAB — URINALYSIS, ROUTINE W REFLEX MICROSCOPIC
Bilirubin Urine: NEGATIVE
Ketones, ur: NEGATIVE
Leukocytes, UA: NEGATIVE
Nitrite: NEGATIVE
Specific Gravity, Urine: 1.02 (ref 1.000–1.030)
Total Protein, Urine: NEGATIVE
Urine Glucose: NEGATIVE
Urobilinogen, UA: 0.2 (ref 0.0–1.0)
pH: 5.5 (ref 5.0–8.0)

## 2018-07-26 LAB — COMPREHENSIVE METABOLIC PANEL
ALT: 18 U/L (ref 0–35)
AST: 19 U/L (ref 0–37)
Albumin: 4.4 g/dL (ref 3.5–5.2)
Alkaline Phosphatase: 64 U/L (ref 39–117)
BUN: 15 mg/dL (ref 6–23)
CO2: 30 mEq/L (ref 19–32)
Calcium: 9.5 mg/dL (ref 8.4–10.5)
Chloride: 105 mEq/L (ref 96–112)
Creatinine, Ser: 0.81 mg/dL (ref 0.40–1.20)
GFR: 75.34 mL/min (ref >60.00)
Glucose, Bld: 91 mg/dL (ref 70–99)
Potassium: 4.1 mEq/L (ref 3.5–5.1)
Sodium: 140 mEq/L (ref 135–145)
Total Bilirubin: 0.5 mg/dL (ref 0.2–1.2)
Total Protein: 7.3 g/dL (ref 6.0–8.3)

## 2018-07-26 LAB — LIPID PANEL
Cholesterol: 222 mg/dL — ABNORMAL HIGH (ref 0–200)
HDL: 64.9 mg/dL (ref >39.00)
LDL Cholesterol: 142 mg/dL — ABNORMAL HIGH (ref 0–99)
NonHDL: 157.33
Total CHOL/HDL Ratio: 3
Triglycerides: 75 mg/dL (ref 0.0–149.0)
VLDL: 15 mg/dL (ref 0.0–40.0)

## 2018-07-26 LAB — CBC
HCT: 39 % (ref 36.0–46.0)
Hemoglobin: 13.2 g/dL (ref 12.0–15.0)
MCHC: 33.9 g/dL (ref 30.0–36.0)
MCV: 88.1 fl (ref 78.0–100.0)
Platelets: 304 10*3/uL (ref 150.0–400.0)
RBC: 4.42 Mil/uL (ref 3.87–5.11)
RDW: 13.7 % (ref 11.5–15.5)
WBC: 5 10*3/uL (ref 4.0–10.5)

## 2018-07-26 LAB — TSH: TSH: 0.7 u[IU]/mL (ref 0.35–4.50)

## 2018-07-26 MED ORDER — NYSTATIN-TRIAMCINOLONE 100000-0.1 UNIT/GM-% EX OINT: 1.0000 "application " | TOPICAL_OINTMENT | Freq: Two times a day (BID) | CUTANEOUS | 1 refills | Status: DC | PRN

## 2018-07-26 NOTE — Assessment & Plan Note (Signed)
Encouraged heart healthy diet, increase exercise, avoid trans fats, consider a krill oil cap daily 

## 2018-07-26 NOTE — Assessment & Plan Note (Signed)
Encouraged to get adequate exercise, calcium and vitamin d intake 

## 2018-07-26 NOTE — Assessment & Plan Note (Signed)
Patient encouraged to maintain heart healthy diet, regular exercise, adequate sleep. Consider daily probiotics. Take medications as prescribed. Labs ordered and reviewed 

## 2018-07-26 NOTE — Assessment & Plan Note (Signed)
Supplement and monitor. Not taking a supplement presently

## 2018-07-26 NOTE — Patient Instructions (Addendum)
Consider pneumonia, shingles and flu shot call insurance and confirm coverage Shingrix shot and call for nurse visit   Lemon Euculyptus oil for bug spray and can get locally at [email or web address] Public relations account executive for [employer] has the Advanced Directives forms to download documents and forward [place] a copy   Goodrx and Covermymeds can help find a decent price  Tylenol and Aleve twice daily and Lidocaine gel by Aspercreme, Icy Hot or Salon Pas  For colds  increaserest and hydration, add probiotics, zinc such as Coldeze or Xicam. Treat fevers as needed, elderberry, Vitamin C  Preventive Care 65 Years and Older, Female Preventive care refers to lifestyle choices and visits with your health care provider that can promote health and wellness. What does preventive care include?  A yearly physical exam. This is also called an annual well check.  Dental exams once or twice a year.  Routine eye exams. Ask your health care provider how often you should have your eyes checked.  Personal lifestyle choices, including: ? Daily care of your teeth and gums. ? Regular physical activity. ? Eating a healthy diet. ? Avoiding tobacco and drug use. ? Limiting alcohol use. ? Practicing safe sex. ? Taking low-dose aspirin every day. ? Taking vitamin and mineral supplements as recommended by your health care provider. What happens during an annual well check? The services and screenings done by your health care provider during your annual well check will depend on your age, overall health, lifestyle risk factors, and family history of disease. Counseling Your health care provider may ask you questions about your:  Alcohol use.  Tobacco use.  Drug use.  Emotional well-being.  Home and relationship well-being.  Sexual activity.  Eating habits.  History of falls.  Memory and ability to understand (cognition).  Work and work Statistician.  Reproductive  health.  Screening You may have the following tests or measurements:  Height, weight, and BMI.  Blood pressure.  Lipid and cholesterol levels. These may be checked every 5 years, or more frequently if you are over 33 years old.  Skin check.  Lung cancer screening. You may have this screening every year starting at age 29 if you have a 30-pack-year history of smoking and currently smoke or have quit within the past 15 years.  Fecal occult blood test (FOBT) of the stool. You may have this test every year starting at age 30.  Flexible sigmoidoscopy or colonoscopy. You may have a sigmoidoscopy every 5 years or a colonoscopy every 10 years starting at age 46.  Hepatitis C blood test.  Hepatitis B blood test.  Sexually transmitted disease (STD) testing.  Diabetes screening. This is done by checking your blood sugar (glucose) after you have not eaten for a while (fasting). You may have this done every 1-3 years.  Bone density scan. This is done to screen for osteoporosis. You may have this done starting at age 29.  Mammogram. This may be done every 1-2 years. Talk to your health care provider about how often you should have regular mammograms.  Talk with your health care provider about your test results, treatment options, and if necessary, the need for more tests. Vaccines Your health care provider may recommend certain vaccines, such as:  Influenza vaccine. This is recommended every year.  Tetanus, diphtheria, and acellular pertussis (Tdap, Td) vaccine. You may need a Td booster every 10 years.  Varicella vaccine. You may need this if you have not been vaccinated.  Zoster  vaccine. You may need this after age 24.  Measles, mumps, and rubella (MMR) vaccine. You may need at least one dose of MMR if you were born in 1957 or later. You may also need a second dose.  Pneumococcal 13-valent conjugate (PCV13) vaccine. One dose is recommended after age 42.  Pneumococcal polysaccharide  (PPSV23) vaccine. One dose is recommended after age 69.  Meningococcal vaccine. You may need this if you have certain conditions.  Hepatitis A vaccine. You may need this if you have certain conditions or if you travel or work in places where you may be exposed to hepatitis A.  Hepatitis B vaccine. You may need this if you have certain conditions or if you travel or work in places where you may be exposed to hepatitis B.  Haemophilus influenzae type b (Hib) vaccine. You may need this if you have certain conditions.  Talk to your health care provider about which screenings and vaccines you need and how often you need them. This information is not intended to replace advice given to you by your health care provider. Make sure you discuss any questions you have with your health care provider. Document Released: 09/17/2015 Document Revised: 05/10/2016 Document Reviewed: 06/22/2015 Elsevier Interactive Patient Education  [employer].

## 2018-07-28 NOTE — Progress Notes (Signed)
Subjective:    Patient ID: Susan Chang, female    DOB: 1950/10/15, 67 y.o.   MRN: [id number]  Chief Complaint  Patient presents with  . Annual Exam    HPI Patient is in today for annual preventative exam. No recent febrile illness or hospitalizations. No polyuria or polydipsia. She has had a respiratory illness for roughly 3 weeks. She has had chest congestion and a cough mostly dry but occassionally productive. She also notes fatigue and malaise. She did note a bit of fever. She had myalgias and fatigue. Denies CP/palp/SOB/HA/congestion/fevers/GI or GU c/o. Taking meds as prescribed headache Past Medical History:  Diagnosis Date  . Breast cancer ([facility]) 2000   right  . Osteopenia   . Preventative health care 07/13/2016  . Vitamin D deficiency 11/14/2015    Past Surgical History:  Procedure Laterality Date  . ABDOMINAL HYSTERECTOMY  2001   TAH b/l SPO for ovarian cyst,  . APPENDECTOMY    . BREAST BIOPSY Right 2000  . BREAST SURGERY  2000   cancer, right lumpectomy  . LYMPH NODE DISSECTION Right 2000    Family History  Problem Relation Age of Onset  . Cancer Mother        Breast cancer  . Hypertension Mother   . Stroke Mother   . Cancer Father        lung cancer  . Cancer Paternal Aunt        breast cancer  . Cancer Brother     Social History   Socioeconomic History  . Marital status: Married    Spouse name: Not on file  . Number of children: Not on file  . Years of education: Not on file  . Highest education level: Not on file  Occupational History  . Occupation: Production manager  Social Needs  . Financial resource strain: Not on file  . Food insecurity:    Worry: Not on file    Inability: Not on file  . Transportation needs:    Medical: Not on file    Non-medical: Not on file  Tobacco Use  . Smoking status: Never Smoker  . Smokeless tobacco: Never Used  Substance and Sexual Activity  . Alcohol use: Yes    Alcohol/week: 0.0 standard drinks    . Drug use: No  . Sexual activity: Yes    Comment: lives with husband, drafting and design, no dietary restrictions  Lifestyle  . Physical activity:    Days per week: Not on file    Minutes per session: Not on file  . Stress: Not on file  Relationships  . Social connections:    Talks on phone: Not on file    Gets together: Not on file    Attends religious service: Not on file    Active member of club or organization: Not on file    Attends meetings of clubs or organizations: Not on file    Relationship status: Not on file  . Intimate partner violence:    Fear of current or ex partner: Not on file    Emotionally abused: Not on file    Physically abused: Not on file    Forced sexual activity: Not on file  Other Topics Concern  . Not on file  Social History Narrative   No major dietary restrictions but eats very little meat   Stays very physically active. Walks daily   Works Acupuncturist    Outpatient Medications Prior to Visit  Medication Sig Dispense  Refill  . nystatin-triamcinolone ointment (MYCOLOG) Apply 1 application topically 2 (two) times daily as needed. (Patient not taking: Reported on 07/26/2018) 30 g 1  . SUMAtriptan (IMITREX) 50 MG tablet Take 1-2 tablets (50-100 mg total) by mouth every 2 (two) hours as needed for migraine (max of 2 dosea day.). May repeat in 2 hours if headache persists or recurs. (Patient not taking: Reported on 07/26/2018) 10 tablet 2  . tiZANidine (ZANAFLEX) 4 MG tablet Take 1 tablet (4 mg total) by mouth every 6 (six) hours as needed for muscle spasms. (Patient not taking: Reported on 07/26/2018) 30 tablet 0  . Vitamin D, Ergocalciferol, (DRISDOL) 50000 units CAPS capsule Take 1 capsule (50,000 Units total) by mouth every 7 (seven) days. (Patient not taking: Reported on 07/26/2018) 4 capsule 4   No facility-administered medications prior to visit.     No Known Allergies  Review of Systems  Constitutional: Negative for fever and  malaise/fatigue.  HENT: Negative for congestion.   Eyes: Negative for blurred vision.  Respiratory: Negative for shortness of breath.   Cardiovascular: Negative for chest pain, palpitations and leg swelling.  Gastrointestinal: Negative for abdominal pain, blood in stool and nausea.  Genitourinary: Negative for dysuria and frequency.  Musculoskeletal: Negative for falls.  Skin: Negative for rash.  Neurological: Negative for dizziness, loss of consciousness and headaches.  Endo/Heme/Allergies: Negative for environmental allergies.  Psychiatric/Behavioral: Negative for depression. The patient is not nervous/anxious.        Objective:    Physical Exam  BP 112/68 (BP Location: Left Arm, Patient Position: Sitting, Cuff Size: Normal)   Pulse 70   Temp 98.2 F (36.8 C) (Oral)   Resp 16   Ht 5\' 6"  (1.676 m)   Wt 153 lb (69.4 kg)   SpO2 98%   BMI 24.69 kg/m  Wt Readings from Last 3 Encounters:  07/26/18 153 lb (69.4 kg)  03/21/18 146 lb 6.4 oz (66.4 kg)  01/21/18 149 lb (67.6 kg)     Lab Results  Component Value Date   WBC 5.0 07/26/2018   HGB 13.2 07/26/2018   HCT 39.0 07/26/2018   PLT 304.0 07/26/2018   GLUCOSE 91 07/26/2018   CHOL 222 (H) 07/26/2018   TRIG 75.0 07/26/2018   HDL 64.90 07/26/2018   LDLCALC 142 (H) 07/26/2018   ALT 18 07/26/2018   AST 19 07/26/2018   NA 140 07/26/2018   K 4.1 07/26/2018   CL 105 07/26/2018   CREATININE 0.81 07/26/2018   BUN 15 07/26/2018   CO2 30 07/26/2018   TSH 0.70 07/26/2018    Lab Results  Component Value Date   TSH 0.70 07/26/2018   Lab Results  Component Value Date   WBC 5.0 07/26/2018   HGB 13.2 07/26/2018   HCT 39.0 07/26/2018   MCV 88.1 07/26/2018   PLT 304.0 07/26/2018   Lab Results  Component Value Date   NA 140 07/26/2018   K 4.1 07/26/2018   CO2 30 07/26/2018   GLUCOSE 91 07/26/2018   BUN 15 07/26/2018   CREATININE 0.81 07/26/2018   BILITOT 0.5 07/26/2018   ALKPHOS 64 07/26/2018   AST 19 07/26/2018     ALT 18 07/26/2018   PROT 7.3 07/26/2018   ALBUMIN 4.4 07/26/2018   CALCIUM 9.5 07/26/2018   GFR 75.34 07/26/2018   Lab Results  Component Value Date   CHOL 222 (H) 07/26/2018   Lab Results  Component Value Date   HDL 64.90 07/26/2018   Lab Results  Component Value Date   LDLCALC 142 (H) 07/26/2018   Lab Results  Component Value Date   TRIG 75.0 07/26/2018   Lab Results  Component Value Date   CHOLHDL 3 07/26/2018   No results found for: HGBA1C     Assessment & Plan:   Problem List Items Addressed This Visit    Osteopenia    Encouraged to get adequate exercise, calcium and vitamin d intake      Vitamin D deficiency    Supplement and monitor. Not taking a supplement presently      Preventative health care    Patient encouraged to maintain heart healthy diet, regular exercise, adequate sleep. Consider daily probiotics. Take medications as prescribed. Labs ordered and reviewed      Relevant Orders   CBC (Completed)   Comprehensive metabolic panel (Completed)   TSH (Completed)   Urinalysis   Hyperlipidemia    Encouraged heart healthy diet, increase exercise, avoid trans fats, consider a krill oil cap daily      Relevant Orders   Lipid panel (Completed)   Migraine    Encouraged increased hydration, 64 ounces of clear fluids daily. Minimize alcohol and caffeine. Eat small frequent meals with lean proteins and complex carbs. Avoid high and low blood sugars. Get adequate sleep, 7-8 hours a night. Needs exercise daily preferably in the morning. Doing well         I have discontinued Kandyce Rud. Gargis's SUMAtriptan, Vitamin D (Ergocalciferol), and tiZANidine. I am also having her maintain her nystatin-triamcinolone ointment.  Meds ordered this encounter  Medications  . nystatin-triamcinolone ointment (MYCOLOG)    Sig: Apply 1 application topically 2 (two) times daily as needed.    Dispense:  60 g    Refill:  1     Penni Homans, MD

## 2018-07-28 NOTE — Assessment & Plan Note (Signed)
Encouraged increased hydration, 64 ounces of clear fluids daily. Minimize alcohol and caffeine. Eat small frequent meals with lean proteins and complex carbs. Avoid high and low blood sugars. Get adequate sleep, 7-8 hours a night. Needs exercise daily preferably in the morning. Doing well

## 2019-03-18 ENCOUNTER — Telehealth: Payer: Self-pay | Admitting: Oncology

## 2019-03-18 NOTE — Telephone Encounter (Signed)
R/s appt per 7/14 sch message- per pt request.  Pt aware of new appt date and time . Picked up mid message.

## 2019-03-21 ENCOUNTER — Ambulatory Visit: Payer: 59 | Admitting: Oncology

## 2019-04-18 LAB — HM MAMMOGRAPHY

## 2019-05-06 ENCOUNTER — Telehealth: Payer: Self-pay | Admitting: Oncology

## 2019-05-06 NOTE — Telephone Encounter (Signed)
[facility] PAL - moved 9/18 f/u to 9/23 - confirmed with patient.

## 2019-05-23 ENCOUNTER — Ambulatory Visit: Payer: 59 | Admitting: Oncology

## 2019-05-27 ENCOUNTER — Telehealth: Payer: Self-pay | Admitting: Oncology

## 2019-05-27 NOTE — Telephone Encounter (Signed)
Patient returned phone call regarding voicemail that was left, per patient's request appointment has been moved from 09/23 to 10/09.

## 2019-05-27 NOTE — Telephone Encounter (Signed)
Returned patient's phone call regarding rescheduling 09/23 appointments, left a voicemail.

## 2019-05-28 ENCOUNTER — Inpatient Hospital Stay: Payer: 59 | Admitting: Oncology

## 2019-06-12 ENCOUNTER — Telehealth: Payer: Self-pay | Admitting: Oncology

## 2019-06-12 NOTE — Telephone Encounter (Signed)
Returned patient's phone call regarding rescheduling an appointment, per patient's request appointment has been cancelled. Patient will call back when ready to reschedule.

## 2019-06-13 ENCOUNTER — Ambulatory Visit: Payer: 59 | Admitting: Oncology

## 2019-07-07 ENCOUNTER — Encounter: Payer: Self-pay | Admitting: Family Medicine

## 2019-07-25 ENCOUNTER — Other Ambulatory Visit: Payer: Self-pay

## 2019-07-28 ENCOUNTER — Ambulatory Visit (INDEPENDENT_AMBULATORY_CARE_PROVIDER_SITE_OTHER): Payer: 59 | Admitting: Family Medicine

## 2019-07-28 ENCOUNTER — Other Ambulatory Visit: Payer: Self-pay

## 2019-07-28 ENCOUNTER — Encounter: Payer: Self-pay | Admitting: Family Medicine

## 2019-07-28 VITALS — BP 118/70 | HR 79 | Temp 97.2°F | Resp 18 | Wt 153.2 lb

## 2019-07-28 DIAGNOSIS — Z0000 Encounter for general adult medical examination without abnormal findings: Secondary | ICD-10-CM | POA: Diagnosis not present

## 2019-07-28 DIAGNOSIS — E782 Mixed hyperlipidemia: Secondary | ICD-10-CM

## 2019-07-28 DIAGNOSIS — M8580 Other specified disorders of bone density and structure, unspecified site: Secondary | ICD-10-CM

## 2019-07-28 DIAGNOSIS — Z86010 Personal history of colonic polyps: Secondary | ICD-10-CM | POA: Diagnosis not present

## 2019-07-28 DIAGNOSIS — E559 Vitamin D deficiency, unspecified: Secondary | ICD-10-CM | POA: Diagnosis not present

## 2019-07-28 LAB — COMPREHENSIVE METABOLIC PANEL
ALT: 14 U/L (ref 0–35)
AST: 18 U/L (ref 0–37)
Albumin: 4.1 g/dL (ref 3.5–5.2)
Alkaline Phosphatase: 68 U/L (ref 39–117)
BUN: 13 mg/dL (ref 6–23)
CO2: 28 mEq/L (ref 19–32)
Calcium: 9.5 mg/dL (ref 8.4–10.5)
Chloride: 103 mEq/L (ref 96–112)
Creatinine, Ser: 0.77 mg/dL (ref 0.40–1.20)
GFR: 74.92 mL/min (ref >60.00)
Glucose, Bld: 91 mg/dL (ref 70–99)
Potassium: 4 mEq/L (ref 3.5–5.1)
Sodium: 139 mEq/L (ref 135–145)
Total Bilirubin: 0.8 mg/dL (ref 0.2–1.2)
Total Protein: 7.3 g/dL (ref 6.0–8.3)

## 2019-07-28 LAB — LIPID PANEL
Cholesterol: 238 mg/dL — ABNORMAL HIGH (ref 0–200)
HDL: 63.4 mg/dL (ref >39.00)
LDL Cholesterol: 161 mg/dL — ABNORMAL HIGH (ref 0–99)
NonHDL: 174.64
Total CHOL/HDL Ratio: 4
Triglycerides: 69 mg/dL (ref 0.0–149.0)
VLDL: 13.8 mg/dL (ref 0.0–40.0)

## 2019-07-28 LAB — CBC
HCT: 38.4 % (ref 36.0–46.0)
Hemoglobin: 13 g/dL (ref 12.0–15.0)
MCHC: 33.8 g/dL (ref 30.0–36.0)
MCV: 90.2 fl (ref 78.0–100.0)
Platelets: 324 10*3/uL (ref 150.0–400.0)
RBC: 4.25 Mil/uL (ref 3.87–5.11)
RDW: 13.4 % (ref 11.5–15.5)
WBC: 5.6 10*3/uL (ref 4.0–10.5)

## 2019-07-28 LAB — VITAMIN D 25 HYDROXY (VIT D DEFICIENCY, FRACTURES): VITD: 22.47 ng/mL — ABNORMAL LOW (ref 30.00–100.00)

## 2019-07-28 LAB — TSH: TSH: 0.95 u[IU]/mL (ref 0.35–4.50)

## 2019-07-28 NOTE — Assessment & Plan Note (Signed)
Encouraged heart healthy diet, increase exercise, avoid trans fats, consider a krill oil cap daily 

## 2019-07-28 NOTE — Assessment & Plan Note (Signed)
Encouraged to get adequate exercise, calcium and vitamin d intake 

## 2019-07-28 NOTE — Patient Instructions (Signed)
Multivitamin with minerals, selenium, zinc, vitamin c and d Vitamin d 2000 IU daily Melatonin 1-5 mg at bedtime ECASA 81 mg daily  Vitamin C 1000 mg twice daily Zinc 30-50 mg daily Elderberry  Omron blood pressure cuff  Pulse oximeter, want oxygen in 90s   Preventive Care 65 Years and Older, Female Preventive care refers to lifestyle choices and visits with your health care provider that can promote health and wellness. This includes:  A yearly physical exam. This is also called an annual well check.  Regular dental and eye exams.  Immunizations.  Screening for certain conditions.  Healthy lifestyle choices, such as diet and exercise. What can I expect for my preventive care visit? Physical exam Your health care provider will check:  Height and weight. These may be used to calculate body mass index (BMI), which is a measurement that tells if you are at a healthy weight.  Heart rate and blood pressure.  Your skin for abnormal spots. Counseling Your health care provider may ask you questions about:  Alcohol, tobacco, and drug use.  Emotional well-being.  Home and relationship well-being.  Sexual activity.  Eating habits.  History of falls.  Memory and ability to understand (cognition).  Work and work Statistician.  Pregnancy and menstrual history. What immunizations do I need?  Influenza (flu) vaccine  This is recommended every year. Tetanus, diphtheria, and pertussis (Tdap) vaccine  You may need a Td booster every 10 years. Varicella (chickenpox) vaccine  You may need this vaccine if you have not already been vaccinated. Zoster (shingles) vaccine  You may need this after age 24. Pneumococcal conjugate (PCV13) vaccine  One dose is recommended after age 34. Pneumococcal polysaccharide (PPSV23) vaccine  One dose is recommended after age 50. Measles, mumps, and rubella (MMR) vaccine  You may need at least one dose of MMR if you were born in 1957  or later. You may also need a second dose. Meningococcal conjugate (MenACWY) vaccine  You may need this if you have certain conditions. Hepatitis A vaccine  You may need this if you have certain conditions or if you travel or work in places where you may be exposed to hepatitis A. Hepatitis B vaccine  You may need this if you have certain conditions or if you travel or work in places where you may be exposed to hepatitis B. Haemophilus influenzae type b (Hib) vaccine  You may need this if you have certain conditions. You may receive vaccines as individual doses or as more than one vaccine together in one shot (combination vaccines). Talk with your health care provider about the risks and benefits of combination vaccines. What tests do I need? Blood tests  Lipid and cholesterol levels. These may be checked every 5 years, or more frequently depending on your overall health.  Hepatitis C test.  Hepatitis B test. Screening  Lung cancer screening. You may have this screening every year starting at age 66 if you have a 30-pack-year history of smoking and currently smoke or have quit within the past 15 years.  Colorectal cancer screening. All adults should have this screening starting at age 51 and continuing until age 34. Your health care provider may recommend screening at age 39 if you are at increased risk. You will have tests every 1-10 years, depending on your results and the type of screening test.  Diabetes screening. This is done by checking your blood sugar (glucose) after you have not eaten for a while (fasting). You may have  this done every 1-3 years.  Mammogram. This may be done every 1-2 years. Talk with your health care provider about how often you should have regular mammograms.  BRCA-related cancer screening. This may be done if you have a family history of breast, ovarian, tubal, or peritoneal cancers. Other tests  Sexually transmitted disease (STD) testing.  Bone  density scan. This is done to screen for osteoporosis. You may have this done starting at age 75. Follow these instructions at home: Eating and drinking  Eat a diet that includes fresh fruits and vegetables, whole grains, lean protein, and low-fat dairy products. Limit your intake of foods with high amounts of sugar, saturated fats, and salt.  Take vitamin and mineral supplements as recommended by your health care provider.  Do not drink alcohol if your health care provider tells you not to drink.  If you drink alcohol: ? Limit how much you have to 0-1 drink a day. ? Be aware of how much alcohol is in your drink. In the [place] one drink equals one 12 oz bottle of beer (355 mL), one 5 oz glass of wine (148 mL), or one 1 oz glass of hard liquor (44 mL). Lifestyle  Take daily care of your teeth and gums.  Stay active. Exercise for at least 30 minutes on 5 or more days each week.  Do not use any products that contain nicotine or tobacco, such as cigarettes, e-cigarettes, and chewing tobacco. If you need help quitting, ask your health care provider.  If you are sexually active, practice safe sex. Use a condom or other form of protection in order to prevent STIs (sexually transmitted infections).  Talk with your health care provider about taking a low-dose aspirin or statin. What's next?  Go to your health care provider once a year for a well check visit.  Ask your health care provider how often you should have your eyes and teeth checked.  Stay up to date on all vaccines. This information is not intended to replace advice given to you by your health care provider. Make sure you discuss any questions you have with your health care provider. Document Released: 09/17/2015 Document Revised: 08/15/2018 Document Reviewed: 08/15/2018 Elsevier Patient Education  2020 [employer].

## 2019-07-28 NOTE — Assessment & Plan Note (Signed)
Supplement and monitor 

## 2019-07-28 NOTE — Assessment & Plan Note (Signed)
Patient encouraged to maintain heart healthy diet, regular exercise, adequate sleep. Consider daily probiotics. Take medications as prescribed. Labs are ordered

## 2019-07-28 NOTE — Progress Notes (Signed)
Subjective:    Patient ID: Susan Chang, female    DOB: November 08, 1950, 68 y.o.   MRN: [id number]  No chief complaint on file.   HPI Patient is in today for annual preventative exam and follow up on chronic medical concerns including vitamin D deficiency, osteopenia and more. She feels well today. No recent febrile illness or hospitalizations. She has been working at home since March and maintaining quarantine well. She denies any acute concerns. Tries to stay active and maintain a heart healthy diet. Denies CP/palp/SOB/HA/congestion/fevers/GI or GU c/o. Taking meds as prescribed  Past Medical History:  Diagnosis Date  . Breast cancer ([place]) 2000   right  . Osteopenia   . Preventative health care 07/13/2016  . Vitamin D deficiency 11/14/2015    Past Surgical History:  Procedure Laterality Date  . ABDOMINAL HYSTERECTOMY  2001   TAH b/l SPO for ovarian cyst,  . APPENDECTOMY    . BREAST BIOPSY Right 2000  . BREAST SURGERY  2000   cancer, right lumpectomy  . LYMPH NODE DISSECTION Right 2000    Family History  Problem Relation Age of Onset  . Cancer Mother        Breast cancer  . Hypertension Mother   . Stroke Mother   . Cancer Father        lung cancer  . Cancer Paternal Aunt        breast cancer  . Cancer Brother     Social History   Socioeconomic History  . Marital status: Married    Spouse name: Not on file  . Number of children: Not on file  . Years of education: Not on file  . Highest education level: Not on file  Occupational History  . Occupation: Production manager  Social Needs  . Financial resource strain: Not on file  . Food insecurity    Worry: Not on file    Inability: Not on file  . Transportation needs    Medical: Not on file    Non-medical: Not on file  Tobacco Use  . Smoking status: Never Smoker  . Smokeless tobacco: Never Used  Substance and Sexual Activity  . Alcohol use: Yes    Alcohol/week: 0.0 standard drinks  . Drug use: No  .  Sexual activity: Yes    Comment: lives with husband, drafting and design, no dietary restrictions  Lifestyle  . Physical activity    Days per week: Not on file    Minutes per session: Not on file  . Stress: Not on file  Relationships  . Social Herbalist on phone: Not on file    Gets together: Not on file    Attends religious service: Not on file    Active member of club or organization: Not on file    Attends meetings of clubs or organizations: Not on file    Relationship status: Not on file  . Intimate partner violence    Fear of current or ex partner: Not on file    Emotionally abused: Not on file    Physically abused: Not on file    Forced sexual activity: Not on file  Other Topics Concern  . Not on file  Social History Narrative   No major dietary restrictions but eats very little meat   Stays very physically active. Walks daily   Works Acupuncturist    Outpatient Medications Prior to Visit  Medication Sig Dispense Refill  . nystatin-triamcinolone ointment (MYCOLOG) Apply  1 application topically 2 (two) times daily as needed. 60 g 1   No facility-administered medications prior to visit.     No Known Allergies  Review of Systems  Constitutional: Negative for chills, fever and malaise/fatigue.  HENT: Negative for congestion and hearing loss.   Eyes: Negative for discharge.  Respiratory: Negative for cough, sputum production and shortness of breath.   Cardiovascular: Negative for chest pain, palpitations and leg swelling.  Gastrointestinal: Negative for abdominal pain, blood in stool, constipation, diarrhea, heartburn, nausea and vomiting.  Genitourinary: Negative for dysuria, frequency, hematuria and urgency.  Musculoskeletal: Negative for back pain, falls and myalgias.  Skin: Negative for rash.  Neurological: Negative for dizziness, sensory change, loss of consciousness, weakness and headaches.  Endo/Heme/Allergies: Negative for environmental  allergies. Does not bruise/bleed easily.  Psychiatric/Behavioral: Negative for depression and suicidal ideas. The patient is not nervous/anxious and does not have insomnia.        Objective:    Physical Exam Constitutional:      General: She is not in acute distress.    Appearance: She is well-developed.  HENT:     Head: Normocephalic and atraumatic.  Eyes:     Conjunctiva/sclera: Conjunctivae normal.  Neck:     Musculoskeletal: Neck supple.     Thyroid: No thyromegaly.  Cardiovascular:     Rate and Rhythm: Normal rate and regular rhythm.     Heart sounds: Normal heart sounds. No murmur.  Pulmonary:     Effort: Pulmonary effort is normal. No respiratory distress.     Breath sounds: Normal breath sounds.  Abdominal:     General: Bowel sounds are normal. There is no distension.     Palpations: Abdomen is soft. There is no mass.     Tenderness: There is no abdominal tenderness.  Lymphadenopathy:     Cervical: No cervical adenopathy.  Skin:    General: Skin is warm and dry.  Neurological:     Mental Status: She is alert and oriented to person, place, and time.  Psychiatric:        Behavior: Behavior normal.     BP 118/70 (BP Location: Left Arm, Patient Position: Sitting, Cuff Size: Normal)   Pulse 79   Temp (!) 97.2 F (36.2 C) (Temporal)   Resp 18   Wt 153 lb 3.2 oz (69.5 kg)   SpO2 98%   BMI 24.73 kg/m  Wt Readings from Last 3 Encounters:  07/28/19 153 lb 3.2 oz (69.5 kg)  07/26/18 153 lb (69.4 kg)  03/21/18 146 lb 6.4 oz (66.4 kg)    Diabetic Foot Exam - Simple   No data filed     Lab Results  Component Value Date   WBC 5.0 07/26/2018   HGB 13.2 07/26/2018   HCT 39.0 07/26/2018   PLT 304.0 07/26/2018   GLUCOSE 91 07/26/2018   CHOL 222 (H) 07/26/2018   TRIG 75.0 07/26/2018   HDL 64.90 07/26/2018   LDLCALC 142 (H) 07/26/2018   ALT 18 07/26/2018   AST 19 07/26/2018   NA 140 07/26/2018   K 4.1 07/26/2018   CL 105 07/26/2018   CREATININE 0.81  07/26/2018   BUN 15 07/26/2018   CO2 30 07/26/2018   TSH 0.70 07/26/2018    Lab Results  Component Value Date   TSH 0.70 07/26/2018   Lab Results  Component Value Date   WBC 5.0 07/26/2018   HGB 13.2 07/26/2018   HCT 39.0 07/26/2018   MCV 88.1 07/26/2018   PLT  304.0 07/26/2018   Lab Results  Component Value Date   NA 140 07/26/2018   K 4.1 07/26/2018   CO2 30 07/26/2018   GLUCOSE 91 07/26/2018   BUN 15 07/26/2018   CREATININE 0.81 07/26/2018   BILITOT 0.5 07/26/2018   ALKPHOS 64 07/26/2018   AST 19 07/26/2018   ALT 18 07/26/2018   PROT 7.3 07/26/2018   ALBUMIN 4.4 07/26/2018   CALCIUM 9.5 07/26/2018   GFR 75.34 07/26/2018   Lab Results  Component Value Date   CHOL 222 (H) 07/26/2018   Lab Results  Component Value Date   HDL 64.90 07/26/2018   Lab Results  Component Value Date   LDLCALC 142 (H) 07/26/2018   Lab Results  Component Value Date   TRIG 75.0 07/26/2018   Lab Results  Component Value Date   CHOLHDL 3 07/26/2018   No results found for: HGBA1C     Assessment & Plan:   Problem List Items Addressed This Visit    Osteopenia    Encouraged to get adequate exercise, calcium and vitamin d intake      Vitamin D deficiency    Supplement and monitor      Relevant Orders   VITAMIN D   Preventative health care    Patient encouraged to maintain heart healthy diet, regular exercise, adequate sleep. Consider daily probiotics. Take medications as prescribed. Labs are ordered       Relevant Orders   CBC   TSH   Hyperlipidemia    Encouraged heart healthy diet, increase exercise, avoid trans fats, consider a krill oil cap daily      Relevant Orders   CMP   Lipid panel    Other Visit Diagnoses    History of colon polyps    -  Primary   Relevant Orders   Ambulatory referral to Gastroenterology      I am having Susan Chang maintain her nystatin-triamcinolone ointment.  No orders of the defined types were placed in this encounter.     Penni Homans, MD

## 2019-07-29 MED ORDER — VITAMIN D (ERGOCALCIFEROL) 1.25 MG (50000 UNIT) PO CAPS: 50000.0000 [IU] | ORAL_CAPSULE | ORAL | 4 refills | Status: DC

## 2019-07-29 NOTE — Addendum Note (Signed)
Addended by: Magdalene Molly A on: 07/29/2019 08:40 AM   Modules accepted: Orders

## 2019-11-28 ENCOUNTER — Encounter: Payer: Self-pay | Admitting: Family Medicine

## 2019-12-05 ENCOUNTER — Other Ambulatory Visit: Payer: Self-pay | Admitting: Family Medicine

## 2020-04-16 ENCOUNTER — Other Ambulatory Visit: Payer: Self-pay | Admitting: Family Medicine

## 2020-04-23 LAB — HM MAMMOGRAPHY

## 2020-04-26 ENCOUNTER — Encounter: Payer: Self-pay | Admitting: *Deleted

## 2020-04-26 NOTE — Progress Notes (Signed)
Received mammogram report from [facility]-negative. Patient is past due her 1 year f/u. Scheduling message sent.

## 2020-04-27 ENCOUNTER — Telehealth: Payer: Self-pay | Admitting: Oncology

## 2020-04-27 NOTE — Telephone Encounter (Signed)
Scheduled appt per 8/23 sch msg- left message with appt date and time

## 2020-05-21 ENCOUNTER — Encounter: Payer: Self-pay | Admitting: *Deleted

## 2020-05-21 NOTE — Progress Notes (Signed)
Received mammogram report from [facility]. Patient missed her 1 year f/u. Scheduling message sent to obtain appointment with Dr. Benay Spice in next few months.

## 2020-05-22 ENCOUNTER — Encounter: Payer: Self-pay | Admitting: Family Medicine

## 2020-05-24 ENCOUNTER — Telehealth: Payer: Self-pay | Admitting: Oncology

## 2020-05-24 NOTE — Telephone Encounter (Signed)
Patient called to cancel 09/24 appointment, per patient's request appointment has been cancelled.

## 2020-05-28 ENCOUNTER — Ambulatory Visit: Payer: 59 | Admitting: Oncology

## 2020-08-02 ENCOUNTER — Ambulatory Visit (INDEPENDENT_AMBULATORY_CARE_PROVIDER_SITE_OTHER): Payer: 59 | Admitting: Family Medicine

## 2020-08-02 ENCOUNTER — Other Ambulatory Visit: Payer: Self-pay

## 2020-08-02 ENCOUNTER — Other Ambulatory Visit: Payer: Self-pay | Admitting: Family Medicine

## 2020-08-02 VITALS — BP 98/60 | HR 86 | Temp 97.2°F | Ht 65.5 in | Wt 148.0 lb

## 2020-08-02 DIAGNOSIS — Z86010 Personal history of colonic polyps: Secondary | ICD-10-CM

## 2020-08-02 DIAGNOSIS — Z0000 Encounter for general adult medical examination without abnormal findings: Secondary | ICD-10-CM

## 2020-08-02 DIAGNOSIS — E559 Vitamin D deficiency, unspecified: Secondary | ICD-10-CM

## 2020-08-02 DIAGNOSIS — M8580 Other specified disorders of bone density and structure, unspecified site: Secondary | ICD-10-CM | POA: Diagnosis not present

## 2020-08-02 DIAGNOSIS — E782 Mixed hyperlipidemia: Secondary | ICD-10-CM

## 2020-08-02 LAB — CBC WITH DIFFERENTIAL/PLATELET
Basophils Absolute: 0.1 10*3/uL (ref 0.0–0.1)
Basophils Relative: 1.3 % (ref 0.0–3.0)
Eosinophils Absolute: 0.1 10*3/uL (ref 0.0–0.7)
Eosinophils Relative: 1.5 % (ref 0.0–5.0)
HCT: 39.6 % (ref 36.0–46.0)
Hemoglobin: 13 g/dL (ref 12.0–15.0)
Lymphocytes Relative: 35.6 % (ref 12.0–46.0)
Lymphs Abs: 1.7 10*3/uL (ref 0.7–4.0)
MCHC: 32.8 g/dL (ref 30.0–36.0)
MCV: 89.1 fl (ref 78.0–100.0)
Monocytes Absolute: 0.4 10*3/uL (ref 0.1–1.0)
Monocytes Relative: 7.5 % (ref 3.0–12.0)
Neutro Abs: 2.6 10*3/uL (ref 1.4–7.7)
Neutrophils Relative %: 54.1 % (ref 43.0–77.0)
Platelets: 306 10*3/uL (ref 150.0–400.0)
RBC: 4.44 Mil/uL (ref 3.87–5.11)
RDW: 13.6 % (ref 11.5–15.5)
WBC: 4.9 10*3/uL (ref 4.0–10.5)

## 2020-08-02 LAB — COMPREHENSIVE METABOLIC PANEL
ALT: 23 U/L (ref 0–35)
AST: 25 U/L (ref 0–37)
Albumin: 4.4 g/dL (ref 3.5–5.2)
Alkaline Phosphatase: 57 U/L (ref 39–117)
BUN: 14 mg/dL (ref 6–23)
CO2: 30 mEq/L (ref 19–32)
Calcium: 9.4 mg/dL (ref 8.4–10.5)
Chloride: 104 mEq/L (ref 96–112)
Creatinine, Ser: 0.82 mg/dL (ref 0.40–1.20)
GFR: 74.05 mL/min (ref >60.00)
Glucose, Bld: 88 mg/dL (ref 70–99)
Potassium: 4.3 mEq/L (ref 3.5–5.1)
Sodium: 138 mEq/L (ref 135–145)
Total Bilirubin: 0.7 mg/dL (ref 0.2–1.2)
Total Protein: 7.4 g/dL (ref 6.0–8.3)

## 2020-08-02 LAB — VITAMIN D 25 HYDROXY (VIT D DEFICIENCY, FRACTURES): VITD: 33.92 ng/mL (ref 30.00–100.00)

## 2020-08-02 LAB — LIPID PANEL
Cholesterol: 226 mg/dL — ABNORMAL HIGH (ref 0–200)
HDL: 68.7 mg/dL (ref >39.00)
LDL Cholesterol: 141 mg/dL — ABNORMAL HIGH (ref 0–99)
NonHDL: 157.37
Total CHOL/HDL Ratio: 3
Triglycerides: 81 mg/dL (ref 0.0–149.0)
VLDL: 16.2 mg/dL (ref 0.0–40.0)

## 2020-08-02 LAB — TSH: TSH: 1.12 u[IU]/mL (ref 0.35–4.50)

## 2020-08-02 MED ORDER — NYSTATIN-TRIAMCINOLONE 100000-0.1 UNIT/GM-% EX OINT
1.0000 | TOPICAL_OINTMENT | Freq: Two times a day (BID) | CUTANEOUS | 1 refills | Status: DC | PRN
Start: 2020-08-02 — End: 2020-08-02

## 2020-08-02 MED ORDER — NYSTATIN-TRIAMCINOLONE 100000-0.1 UNIT/GM-% EX OINT
1.0000 | TOPICAL_OINTMENT | Freq: Two times a day (BID) | CUTANEOUS | 1 refills | Status: DC | PRN
Start: 2020-08-02 — End: 2021-01-04

## 2020-08-02 NOTE — Assessment & Plan Note (Addendum)
Patient encouraged to maintain heart healthy diet, regular exercise, adequate sleep. Consider daily probiotics. Take medications as prescribed. MGM in August 2021 normal. She declines all immunizations except Tetanus. Discussed COVID shots but declined for now.

## 2020-08-02 NOTE — Assessment & Plan Note (Signed)
Encouraged to get adequate exercise, calcium and vitamin d intake 

## 2020-08-02 NOTE — Progress Notes (Signed)
Subjective:    Patient ID: Susan Chang, female    DOB: Aug 15, 1951, 69 y.o.   MRN: [id number]  Chief Complaint  Patient presents with  . Annual Exam    HPI Patient is in today for annual preventative healthcare and chronic medical concerns. No recent illness or recent hospitalized.  Requests refill for nystatin but has infrequent symptoms. She stays active and eats well. Denies CP/palp/SOB/HA/congestion/fevers/GI or GU c/o. Taking meds as prescribed. Declines COVID shots. Denies CP/palp/SOB/HA/congestion/fevers/GI or GU c/o. Taking meds as prescribed  Past Medical History:  Diagnosis Date  . Breast cancer ([place]) 2000   right  . Osteopenia   . Preventative health care 07/13/2016  . Vitamin D deficiency 11/14/2015    Past Surgical History:  Procedure Laterality Date  . ABDOMINAL HYSTERECTOMY  2001   TAH b/l SPO for ovarian cyst,  . APPENDECTOMY    . BREAST BIOPSY Right 2000  . BREAST SURGERY  2000   cancer, right lumpectomy  . LYMPH NODE DISSECTION Right 2000    Family History  Problem Relation Age of Onset  . Cancer Mother        Breast cancer  . Hypertension Mother   . Stroke Mother   . Cancer Father        lung cancer  . Cancer Paternal Aunt        breast cancer  . Cancer Brother     Social History   Socioeconomic History  . Marital status: Married    Spouse name: Not on file  . Number of children: Not on file  . Years of education: Not on file  . Highest education level: Not on file  Occupational History  . Occupation: Production manager  Tobacco Use  . Smoking status: Never Smoker  . Smokeless tobacco: Never Used  Vaping Use  . Vaping Use: Never used  Substance and Sexual Activity  . Alcohol use: Yes    Alcohol/week: 0.0 standard drinks  . Drug use: No  . Sexual activity: Yes    Comment: lives with husband, drafting and design, no dietary restrictions  Other Topics Concern  . Not on file  Social History Narrative   No major dietary  restrictions but eats very little meat   Stays very physically active. Walks daily   Works Acupuncturist   Social Determinants of Radio broadcast assistant Strain:   . Difficulty of Paying Living Expenses: Not on file  Food Insecurity:   . Worried About Charity fundraiser in the Last Year: Not on file  . Ran Out of Food in the Last Year: Not on file  Transportation Needs:   . Lack of Transportation (Medical): Not on file  . Lack of Transportation (Non-Medical): Not on file  Physical Activity:   . Days of Exercise per Week: Not on file  . Minutes of Exercise per Session: Not on file  Stress:   . Feeling of Stress : Not on file  Social Connections:   . Frequency of Communication with Friends and Family: Not on file  . Frequency of Social Gatherings with Friends and Family: Not on file  . Attends Religious Services: Not on file  . Active Member of Clubs or Organizations: Not on file  . Attends Archivist Meetings: Not on file  . Marital Status: Not on file  Intimate Partner Violence:   . Fear of Current or Ex-Partner: Not on file  . Emotionally Abused: Not on file  .  Physically Abused: Not on file  . Sexually Abused: Not on file    Outpatient Medications Prior to Visit  Medication Sig Dispense Refill  . nystatin-triamcinolone ointment (MYCOLOG) Apply 1 application topically 2 (two) times daily as needed. 60 g 1  . Vitamin D, Ergocalciferol, (DRISDOL) 1.25 MG (50000 UNIT) CAPS capsule TAKE 1 CAPSULE BY MOUTH EVERY 7 DAYS 4 capsule 4   No facility-administered medications prior to visit.    No Known Allergies  Review of Systems  Constitutional: Negative for chills, fever and malaise/fatigue.  HENT: Negative for congestion and hearing loss.   Eyes: Negative for discharge.  Respiratory: Negative for cough, sputum production and shortness of breath.   Cardiovascular: Negative for chest pain, palpitations and leg swelling.  Gastrointestinal: Negative for  abdominal pain, blood in stool, constipation, diarrhea, heartburn, nausea and vomiting.  Genitourinary: Negative for dysuria, frequency, hematuria and urgency.  Musculoskeletal: Negative for back pain, falls and myalgias.  Skin: Negative for rash.  Neurological: Negative for dizziness, sensory change, loss of consciousness, weakness and headaches.  Endo/Heme/Allergies: Negative for environmental allergies. Does not bruise/bleed easily.  Psychiatric/Behavioral: Negative for depression and suicidal ideas. The patient is not nervous/anxious and does not have insomnia.        Objective:    Physical Exam Constitutional:      General: She is not in acute distress.    Appearance: She is well-developed.  HENT:     Head: Normocephalic and atraumatic.  Eyes:     Conjunctiva/sclera: Conjunctivae normal.  Neck:     Thyroid: No thyromegaly.  Cardiovascular:     Rate and Rhythm: Normal rate and regular rhythm.     Heart sounds: Normal heart sounds. No murmur heard.   Pulmonary:     Effort: Pulmonary effort is normal. No respiratory distress.     Breath sounds: Normal breath sounds.  Abdominal:     General: Bowel sounds are normal. There is no distension.     Palpations: Abdomen is soft. There is no mass.     Tenderness: There is no abdominal tenderness.  Musculoskeletal:     Cervical back: Neck supple.  Lymphadenopathy:     Cervical: No cervical adenopathy.  Skin:    General: Skin is warm and dry.  Neurological:     Mental Status: She is alert and oriented to person, place, and time.  Psychiatric:        Behavior: Behavior normal.     BP 98/60 (BP Location: Right Arm, Patient Position: Sitting, Cuff Size: Normal)   Pulse 86   Temp (!) 97.2 F (36.2 C) (Temporal)   Ht 5' 5.5" (1.664 m)   Wt 148 lb (67.1 kg)   SpO2 99%   BMI 24.25 kg/m  Wt Readings from Last 3 Encounters:  08/02/20 148 lb (67.1 kg)  07/28/19 153 lb 3.2 oz (69.5 kg)  07/26/18 153 lb (69.4 kg)    Diabetic  Foot Exam - Simple   No data filed     Lab Results  Component Value Date   WBC 5.6 07/28/2019   HGB 13.0 07/28/2019   HCT 38.4 07/28/2019   PLT 324.0 07/28/2019   GLUCOSE 91 07/28/2019   CHOL 238 (H) 07/28/2019   TRIG 69.0 07/28/2019   HDL 63.40 07/28/2019   LDLCALC 161 (H) 07/28/2019   ALT 14 07/28/2019   AST 18 07/28/2019   NA 139 07/28/2019   K 4.0 07/28/2019   CL 103 07/28/2019   CREATININE 0.77 07/28/2019   BUN  13 07/28/2019   CO2 28 07/28/2019   TSH 0.95 07/28/2019    Lab Results  Component Value Date   TSH 0.95 07/28/2019   Lab Results  Component Value Date   WBC 5.6 07/28/2019   HGB 13.0 07/28/2019   HCT 38.4 07/28/2019   MCV 90.2 07/28/2019   PLT 324.0 07/28/2019   Lab Results  Component Value Date   NA 139 07/28/2019   K 4.0 07/28/2019   CO2 28 07/28/2019   GLUCOSE 91 07/28/2019   BUN 13 07/28/2019   CREATININE 0.77 07/28/2019   BILITOT 0.8 07/28/2019   ALKPHOS 68 07/28/2019   AST 18 07/28/2019   ALT 14 07/28/2019   PROT 7.3 07/28/2019   ALBUMIN 4.1 07/28/2019   CALCIUM 9.5 07/28/2019   GFR 74.92 07/28/2019   Lab Results  Component Value Date   CHOL 238 (H) 07/28/2019   Lab Results  Component Value Date   HDL 63.40 07/28/2019   Lab Results  Component Value Date   LDLCALC 161 (H) 07/28/2019   Lab Results  Component Value Date   TRIG 69.0 07/28/2019   Lab Results  Component Value Date   CHOLHDL 4 07/28/2019   No results found for: HGBA1C     Assessment & Plan:   Problem List Items Addressed This Visit    Osteopenia    Encouraged to get adequate exercise, calcium and vitamin d intake      Vitamin D deficiency    Supplement and monitor      Preventative health care    Patient encouraged to maintain heart healthy diet, regular exercise, adequate sleep. Consider daily probiotics. Take medications as prescribed. MGM in August 2021 normal.       Hyperlipidemia    Encouraged heart healthy diet, increase exercise, avoid  trans fats, consider a krill oil cap daily      Hx of colonic polyp    [facility] recommendation recommends repeat colonoscopy in 5 years after colonoscopy in 2015, patient referred for repeat         I am having Olivia Mackie maintain her nystatin-triamcinolone ointment and Vitamin D (Ergocalciferol).  No orders of the defined types were placed in this encounter.    Penni Homans, MD

## 2020-08-02 NOTE — Assessment & Plan Note (Addendum)
Supplement and monitor, taking 50000 IU weekly consider switching to a daily

## 2020-08-02 NOTE — Assessment & Plan Note (Signed)
[facility] recommendation recommends repeat colonoscopy in 5 years after colonoscopy in 2015, patient referred for repeat

## 2020-08-02 NOTE — Assessment & Plan Note (Signed)
Encouraged heart healthy diet, increase exercise, avoid trans fats, consider a krill oil cap daily 

## 2020-08-02 NOTE — Patient Instructions (Signed)
Preventive Care 67 Years and Older, Female Preventive care refers to lifestyle choices and visits with your health care provider that can promote health and wellness. This includes:  A yearly physical exam. This is also called an annual well check.  Regular dental and eye exams.  Immunizations.  Screening for certain conditions.  Healthy lifestyle choices, such as diet and exercise. What can I expect for my preventive care visit? Physical exam Your health care provider will check:  Height and weight. These may be used to calculate body mass index (BMI), which is a measurement that tells if you are at a healthy weight.  Heart rate and blood pressure.  Your skin for abnormal spots. Counseling Your health care provider may ask you questions about:  Alcohol, tobacco, and drug use.  Emotional well-being.  Home and relationship well-being.  Sexual activity.  Eating habits.  History of falls.  Memory and ability to understand (cognition).  Work and work Statistician.  Pregnancy and menstrual history. What immunizations do I need?  Influenza (flu) vaccine  This is recommended every year. Tetanus, diphtheria, and pertussis (Tdap) vaccine  You may need a Td booster every 10 years. Varicella (chickenpox) vaccine  You may need this vaccine if you have not already been vaccinated. Zoster (shingles) vaccine  You may need this after age 67. Pneumococcal conjugate (PCV13) vaccine  One dose is recommended after age 67. Pneumococcal polysaccharide (PPSV23) vaccine  One dose is recommended after age 72. Measles, mumps, and rubella (MMR) vaccine  You may need at least one dose of MMR if you were born in 1957 or later. You may also need a second dose. Meningococcal conjugate (MenACWY) vaccine  You may need this if you have certain conditions. Hepatitis A vaccine  You may need this if you have certain conditions or if you travel or work in places where you may be exposed  to hepatitis A. Hepatitis B vaccine  You may need this if you have certain conditions or if you travel or work in places where you may be exposed to hepatitis B. Haemophilus influenzae type b (Hib) vaccine  You may need this if you have certain conditions. You may receive vaccines as individual doses or as more than one vaccine together in one shot (combination vaccines). Talk with your health care provider about the risks and benefits of combination vaccines. What tests do I need? Blood tests  Lipid and cholesterol levels. These may be checked every 5 years, or more frequently depending on your overall health.  Hepatitis C test.  Hepatitis B test. Screening  Lung cancer screening. You may have this screening every year starting at age 67 if you have a 30-pack-year history of smoking and currently smoke or have quit within the past 15 years.  Colorectal cancer screening. All adults should have this screening starting at age 67 and continuing until age 15. Your health care provider may recommend screening at age 23 if you are at increased risk. You will have tests every 1-10 years, depending on your results and the type of screening test.  Diabetes screening. This is done by checking your blood sugar (glucose) after you have not eaten for a while (fasting). You may have this done every 1-3 years.  Mammogram. This may be done every 1-2 years. Talk with your health care provider about how often you should have regular mammograms.  BRCA-related cancer screening. This may be done if you have a family history of breast, ovarian, tubal, or peritoneal cancers.  Other tests  Sexually transmitted disease (STD) testing.  Bone density scan. This is done to screen for osteoporosis. You may have this done starting at age 67. Follow these instructions at home: Eating and drinking  Eat a diet that includes fresh fruits and vegetables, whole grains, lean protein, and low-fat dairy products. Limit  your intake of foods with high amounts of sugar, saturated fats, and salt.  Take vitamin and mineral supplements as recommended by your health care provider.  Do not drink alcohol if your health care provider tells you not to drink.  If you drink alcohol: ? Limit how much you have to 0-1 drink a day. ? Be aware of how much alcohol is in your drink. In the [place] one drink equals one 12 oz bottle of beer (355 mL), one 5 oz glass of wine (148 mL), or one 1 oz glass of hard liquor (44 mL). Lifestyle  Take daily care of your teeth and gums.  Stay active. Exercise for at least 30 minutes on 5 or more days each week.  Do not use any products that contain nicotine or tobacco, such as cigarettes, e-cigarettes, and chewing tobacco. If you need help quitting, ask your health care provider.  If you are sexually active, practice safe sex. Use a condom or other form of protection in order to prevent STIs (sexually transmitted infections).  Talk with your health care provider about taking a low-dose aspirin or statin. What's next?  Go to your health care provider once a year for a well check visit.  Ask your health care provider how often you should have your eyes and teeth checked.  Stay up to date on all vaccines. This information is not intended to replace advice given to you by your health care provider. Make sure you discuss any questions you have with your health care provider. Document Revised: 08/15/2018 Document Reviewed: 08/15/2018 Elsevier Patient Education  2020 [employer].

## 2020-08-03 ENCOUNTER — Encounter: Payer: Self-pay | Admitting: *Deleted

## 2020-08-03 ENCOUNTER — Telehealth: Payer: Self-pay

## 2020-08-03 NOTE — Telephone Encounter (Signed)
PA initiated via Covermymeds; KEY: [id number]. Awaiting determination.

## 2020-08-03 NOTE — Telephone Encounter (Signed)
PA approved.   Request Reference Number: [id number]. [place] OIN is approved through 08/03/2021. Your patient may now fill this prescription and it will be covered.

## 2020-08-20 ENCOUNTER — Other Ambulatory Visit: Payer: Self-pay | Admitting: Family Medicine

## 2020-08-20 NOTE — Telephone Encounter (Signed)
Would you like Pt to continue Ergocalciferol?  

## 2020-08-23 ENCOUNTER — Emergency Department (HOSPITAL_BASED_OUTPATIENT_CLINIC_OR_DEPARTMENT_OTHER)
Admission: EM | Admit: 2020-08-23 | Discharge: 2020-08-23 | Disposition: A | Discharge status: Home / Self Care | Payer: 59 | Attending: Emergency Medicine | Admitting: Emergency Medicine

## 2020-08-23 ENCOUNTER — Emergency Department (HOSPITAL_BASED_OUTPATIENT_CLINIC_OR_DEPARTMENT_OTHER): Payer: 59

## 2020-08-23 ENCOUNTER — Encounter (HOSPITAL_BASED_OUTPATIENT_CLINIC_OR_DEPARTMENT_OTHER): Payer: Self-pay

## 2020-08-23 ENCOUNTER — Other Ambulatory Visit: Payer: Self-pay

## 2020-08-23 DIAGNOSIS — S301XXA Contusion of abdominal wall, initial encounter: Secondary | ICD-10-CM | POA: Diagnosis not present

## 2020-08-23 DIAGNOSIS — S20211A Contusion of right front wall of thorax, initial encounter: Secondary | ICD-10-CM | POA: Insufficient documentation

## 2020-08-23 DIAGNOSIS — Z853 Personal history of malignant neoplasm of breast: Secondary | ICD-10-CM | POA: Diagnosis not present

## 2020-08-23 DIAGNOSIS — Y92410 Unspecified street and highway as the place of occurrence of the external cause: Secondary | ICD-10-CM | POA: Insufficient documentation

## 2020-08-23 DIAGNOSIS — M545 Low back pain, unspecified: Secondary | ICD-10-CM | POA: Diagnosis not present

## 2020-08-23 DIAGNOSIS — S3991XA Unspecified injury of abdomen, initial encounter: Secondary | ICD-10-CM | POA: Diagnosis present

## 2020-08-23 DIAGNOSIS — K828 Other specified diseases of gallbladder: Secondary | ICD-10-CM | POA: Diagnosis not present

## 2020-08-23 DIAGNOSIS — K839 Disease of biliary tract, unspecified: Secondary | ICD-10-CM | POA: Diagnosis not present

## 2020-08-23 DIAGNOSIS — K838 Other specified diseases of biliary tract: Secondary | ICD-10-CM

## 2020-08-23 LAB — URINALYSIS, MICROSCOPIC (REFLEX)
Bacteria, UA: NONE SEEN
WBC, UA: NONE SEEN WBC/hpf (ref 0–5)

## 2020-08-23 LAB — URINALYSIS, ROUTINE W REFLEX MICROSCOPIC
Bilirubin Urine: NEGATIVE
Glucose, UA: NEGATIVE mg/dL
Ketones, ur: NEGATIVE mg/dL
Leukocytes,Ua: NEGATIVE
Nitrite: NEGATIVE
Protein, ur: NEGATIVE mg/dL
Specific Gravity, Urine: <1.005 — ABNORMAL LOW (ref 1.005–1.030)
pH: 7 (ref 5.0–8.0)

## 2020-08-23 LAB — CBC WITH DIFFERENTIAL/PLATELET
Abs Immature Granulocytes: 0.03 10*3/uL (ref 0.00–0.07)
Basophils Absolute: 0.1 10*3/uL (ref 0.0–0.1)
Basophils Relative: 1 %
Eosinophils Absolute: 0 10*3/uL (ref 0.0–0.5)
Eosinophils Relative: 0 %
HCT: 42.4 % (ref 36.0–46.0)
Hemoglobin: 14.2 g/dL (ref 12.0–15.0)
Immature Granulocytes: 0 %
Lymphocytes Relative: 21 %
Lymphs Abs: 1.9 10*3/uL (ref 0.7–4.0)
MCH: 30.3 pg (ref 26.0–34.0)
MCHC: 33.5 g/dL (ref 30.0–36.0)
MCV: 90.4 fL (ref 80.0–100.0)
Monocytes Absolute: 0.5 10*3/uL (ref 0.1–1.0)
Monocytes Relative: 6 %
Neutro Abs: 6.8 10*3/uL (ref 1.7–7.7)
Neutrophils Relative %: 72 %
Platelets: 327 10*3/uL (ref 150–400)
RBC: 4.69 MIL/uL (ref 3.87–5.11)
RDW: 13 % (ref 11.5–15.5)
WBC: 9.3 10*3/uL (ref 4.0–10.5)
nRBC: 0 % (ref 0.0–0.2)

## 2020-08-23 LAB — BASIC METABOLIC PANEL
Anion gap: 11 (ref 5–15)
BUN: 11 mg/dL (ref 8–23)
CO2: 26 mmol/L (ref 22–32)
Calcium: 9.7 mg/dL (ref 8.9–10.3)
Chloride: 102 mmol/L (ref 98–111)
Creatinine, Ser: 0.88 mg/dL (ref 0.44–1.00)
GFR, Estimated: >60 mL/min (ref >60)
Glucose, Bld: 99 mg/dL (ref 70–99)
Potassium: 3.9 mmol/L (ref 3.5–5.1)
Sodium: 139 mmol/L (ref 135–145)

## 2020-08-23 LAB — HEPATIC FUNCTION PANEL
ALT: 28 U/L (ref 0–44)
AST: 30 U/L (ref 15–41)
Albumin: 4.7 g/dL (ref 3.5–5.0)
Alkaline Phosphatase: 63 U/L (ref 38–126)
Bilirubin, Direct: 0.1 mg/dL (ref 0.0–0.2)
Indirect Bilirubin: 0 mg/dL — ABNORMAL LOW (ref 0.3–0.9)
Total Bilirubin: 0.1 mg/dL — ABNORMAL LOW (ref 0.3–1.2)
Total Protein: 8.2 g/dL — ABNORMAL HIGH (ref 6.5–8.1)

## 2020-08-23 MED ORDER — IOHEXOL 300 MG/ML  SOLN
100.0000 mL | Freq: Once | INTRAMUSCULAR | Status: AC | PRN
  Administered 2020-08-23: 100 mL via INTRAVENOUS
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
## 2020-08-23 NOTE — ED Notes (Addendum)
PT approached this tech and multiple others in the lobby about wait time, Asking another PT while attempting to bring them back to a room. PT interrupted by this tech and reminded of HIPPA laws before proceeding to take another PT to their room. PT told by this tech that an RN or another staff member will speak with her about her concerns when they are free to do so. Charge RN informed
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
## 2020-09-08 ENCOUNTER — Other Ambulatory Visit: Payer: Medicare HMO | Admitting: Surgery

## 2020-09-08 ENCOUNTER — Other Ambulatory Visit: Payer: Self-pay | Admitting: Surgery

## 2020-09-08 DIAGNOSIS — K828 Other specified diseases of gallbladder: Secondary | ICD-10-CM

## 2020-09-11 ENCOUNTER — Encounter: Payer: Self-pay | Admitting: Family Medicine

## 2020-09-13 ENCOUNTER — Ambulatory Visit: Payer: 59 | Admitting: Medical

## 2020-09-13 ENCOUNTER — Other Ambulatory Visit: Payer: Self-pay | Admitting: Family Medicine

## 2020-09-13 DIAGNOSIS — R3121 Asymptomatic microscopic hematuria: Secondary | ICD-10-CM

## 2020-09-30 ENCOUNTER — Ambulatory Visit
Admission: RE | Admit: 2020-09-30 | Discharge: 2020-09-30 | Disposition: A | Discharge status: Home / Self Care | Payer: Medicare HMO | Source: Ambulatory Visit | Attending: Surgery | Admitting: Surgery

## 2020-09-30 ENCOUNTER — Other Ambulatory Visit: Payer: Self-pay

## 2020-09-30 DIAGNOSIS — N281 Cyst of kidney, acquired: Secondary | ICD-10-CM | POA: Diagnosis not present

## 2020-09-30 DIAGNOSIS — K838 Other specified diseases of biliary tract: Secondary | ICD-10-CM | POA: Diagnosis not present

## 2020-09-30 DIAGNOSIS — K828 Other specified diseases of gallbladder: Secondary | ICD-10-CM | POA: Diagnosis not present

## 2020-09-30 DIAGNOSIS — R935 Abnormal findings on diagnostic imaging of other abdominal regions, including retroperitoneum: Secondary | ICD-10-CM | POA: Diagnosis not present

## 2020-09-30 MED ORDER — GADOBENATE DIMEGLUMINE 529 MG/ML IV SOLN
14.0000 mL | Freq: Once | INTRAVENOUS | Status: AC | PRN
  Administered 2020-09-30: 14 mL via INTRAVENOUS

## 2020-10-01 ENCOUNTER — Encounter: Payer: Self-pay | Admitting: Family Medicine

## 2020-10-04 DIAGNOSIS — Z86010 Personal history of colonic polyps: Secondary | ICD-10-CM | POA: Diagnosis not present

## 2020-10-04 DIAGNOSIS — R932 Abnormal findings on diagnostic imaging of liver and biliary tract: Secondary | ICD-10-CM | POA: Diagnosis not present

## 2020-10-21 DIAGNOSIS — R350 Frequency of micturition: Secondary | ICD-10-CM | POA: Diagnosis not present

## 2020-10-21 DIAGNOSIS — R3121 Asymptomatic microscopic hematuria: Secondary | ICD-10-CM | POA: Diagnosis not present

## 2020-10-25 DIAGNOSIS — Z01812 Encounter for preprocedural laboratory examination: Secondary | ICD-10-CM | POA: Diagnosis not present

## 2020-10-27 DIAGNOSIS — K621 Rectal polyp: Secondary | ICD-10-CM | POA: Diagnosis not present

## 2020-10-27 DIAGNOSIS — K635 Polyp of colon: Secondary | ICD-10-CM | POA: Diagnosis not present

## 2020-10-27 DIAGNOSIS — K5730 Diverticulosis of large intestine without perforation or abscess without bleeding: Secondary | ICD-10-CM | POA: Diagnosis not present

## 2020-10-27 DIAGNOSIS — Z86010 Personal history of colonic polyps: Secondary | ICD-10-CM | POA: Diagnosis not present

## 2020-10-31 ENCOUNTER — Encounter (HOSPITAL_BASED_OUTPATIENT_CLINIC_OR_DEPARTMENT_OTHER): Payer: Self-pay | Admitting: *Deleted

## 2020-10-31 ENCOUNTER — Other Ambulatory Visit: Payer: Self-pay

## 2020-10-31 ENCOUNTER — Emergency Department (HOSPITAL_BASED_OUTPATIENT_CLINIC_OR_DEPARTMENT_OTHER)
Admission: EM | Admit: 2020-10-31 | Discharge: 2020-10-31 | Disposition: A | Discharge status: Home / Self Care | Payer: Medicare HMO | Attending: Emergency Medicine | Admitting: Emergency Medicine

## 2020-10-31 ENCOUNTER — Encounter: Payer: Self-pay | Admitting: Family Medicine

## 2020-10-31 ENCOUNTER — Emergency Department (HOSPITAL_BASED_OUTPATIENT_CLINIC_OR_DEPARTMENT_OTHER): Payer: Medicare HMO

## 2020-10-31 ENCOUNTER — Other Ambulatory Visit (HOSPITAL_COMMUNITY): Payer: Self-pay | Admitting: Emergency Medicine

## 2020-10-31 DIAGNOSIS — R519 Headache, unspecified: Secondary | ICD-10-CM | POA: Diagnosis not present

## 2020-10-31 DIAGNOSIS — Z853 Personal history of malignant neoplasm of breast: Secondary | ICD-10-CM | POA: Insufficient documentation

## 2020-10-31 DIAGNOSIS — R590 Localized enlarged lymph nodes: Secondary | ICD-10-CM | POA: Diagnosis not present

## 2020-10-31 DIAGNOSIS — Z8541 Personal history of malignant neoplasm of cervix uteri: Secondary | ICD-10-CM | POA: Insufficient documentation

## 2020-10-31 DIAGNOSIS — L040 Acute lymphadenitis of face, head and neck: Secondary | ICD-10-CM | POA: Diagnosis not present

## 2020-10-31 DIAGNOSIS — L03811 Cellulitis of head [any part, except face]: Secondary | ICD-10-CM | POA: Insufficient documentation

## 2020-10-31 DIAGNOSIS — R591 Generalized enlarged lymph nodes: Secondary | ICD-10-CM | POA: Diagnosis present

## 2020-10-31 LAB — CBC WITH DIFFERENTIAL/PLATELET
Abs Immature Granulocytes: 0.01 10*3/uL (ref 0.00–0.07)
Basophils Absolute: 0.1 10*3/uL (ref 0.0–0.1)
Basophils Relative: 1 %
Eosinophils Absolute: 0.1 10*3/uL (ref 0.0–0.5)
Eosinophils Relative: 1 %
HCT: 39.3 % (ref 36.0–46.0)
Hemoglobin: 13 g/dL (ref 12.0–15.0)
Immature Granulocytes: 0 %
Lymphocytes Relative: 28 %
Lymphs Abs: 1.8 10*3/uL (ref 0.7–4.0)
MCH: 29.7 pg (ref 26.0–34.0)
MCHC: 33.1 g/dL (ref 30.0–36.0)
MCV: 89.9 fL (ref 80.0–100.0)
Monocytes Absolute: 0.6 10*3/uL (ref 0.1–1.0)
Monocytes Relative: 10 %
Neutro Abs: 4 10*3/uL (ref 1.7–7.7)
Neutrophils Relative %: 60 %
Platelets: 274 10*3/uL (ref 150–400)
RBC: 4.37 MIL/uL (ref 3.87–5.11)
RDW: 12.4 % (ref 11.5–15.5)
WBC: 6.6 10*3/uL (ref 4.0–10.5)
nRBC: 0 % (ref 0.0–0.2)

## 2020-10-31 LAB — BASIC METABOLIC PANEL
Anion gap: 9 (ref 5–15)
BUN: 14 mg/dL (ref 8–23)
CO2: 26 mmol/L (ref 22–32)
Calcium: 9.6 mg/dL (ref 8.9–10.3)
Chloride: 104 mmol/L (ref 98–111)
Creatinine, Ser: 0.7 mg/dL (ref 0.44–1.00)
GFR, Estimated: >60 mL/min (ref >60)
Glucose, Bld: 99 mg/dL (ref 70–99)
Potassium: 3.8 mmol/L (ref 3.5–5.1)
Sodium: 139 mmol/L (ref 135–145)

## 2020-10-31 MED ORDER — IOHEXOL 300 MG/ML  SOLN
100.0000 mL | Freq: Once | INTRAMUSCULAR | Status: AC | PRN
  Administered 2020-10-31: 100 mL via INTRAVENOUS

## 2020-10-31 MED ORDER — DOXYCYCLINE HYCLATE 100 MG PO TABS
100.0000 mg | ORAL_TABLET | Freq: Once | ORAL | Status: AC
  Administered 2020-10-31: 100 mg via ORAL
  Filled 2020-10-31: qty 1

## 2020-10-31 MED ORDER — DOXYCYCLINE HYCLATE 100 MG PO CAPS: 100.0000 mg | ORAL_CAPSULE | Freq: Two times a day (BID) | ORAL | 0 refills | Status: DC

## 2020-10-31 NOTE — ED Triage Notes (Signed)
Pt with swollen lymph node to posterior neck and head x 3 days. Pt has chills without fever.

## 2020-10-31 NOTE — ED Notes (Signed)
ED Provider at bedside. 

## 2020-10-31 NOTE — ED Provider Notes (Signed)
[facility] EMERGENCY DEPARTMENT Provider Note   CSN: [id number] Arrival date & time: 10/31/20  1435     History Chief Complaint  Patient presents with  . Lymphadenopathy    Susan Chang is a 70 y.o. female.  Pt presents to the ED today with several lymph nodes to the right side of her neck.  The pt also c/o a headache.  Pt noticed them on 2/23 after having a colonoscopy.  The pt's husband said he cut pt's hair last week and cut her scalp.  Pt denies any throat or sinus pain.        Past Medical History:  Diagnosis Date  . Breast cancer ([place]) 2000   right  . Osteopenia   . Preventative health care 07/13/2016  . Vitamin D deficiency 11/14/2015    Patient Active Problem List   Diagnosis Date Noted  . Abdominal pain 01/28/2018  . Right hip pain 01/28/2018  . Migraine 01/10/2018  . Cervical cancer screening 07/20/2017  . Hyperlipidemia 07/20/2017  . Hx of colonic polyp 07/20/2017  . Preventative health care 07/13/2016  . Vitamin D deficiency 11/14/2015  . Breast cancer ([place])   . Osteopenia     Past Surgical History:  Procedure Laterality Date  . ABDOMINAL HYSTERECTOMY  2001   TAH b/l SPO for ovarian cyst,  . APPENDECTOMY    . BREAST BIOPSY Right 2000  . BREAST SURGERY  2000   cancer, right lumpectomy  . LYMPH NODE DISSECTION Right 2000     OB History   No obstetric history on file.     Family History  Problem Relation Age of Onset  . Cancer Mother        Breast cancer  . Hypertension Mother   . Stroke Mother   . Cancer Father        lung cancer  . Cancer Paternal Aunt        breast cancer  . Cancer Brother     Social History   Tobacco Use  . Smoking status: Never Smoker  . Smokeless tobacco: Never Used  Vaping Use  . Vaping Use: Never used  Substance Use Topics  . Alcohol use: Not Currently    Alcohol/week: 0.0 standard drinks  . Drug use: No    Home Medications Prior to Admission medications   Medication Sig Start  Date End Date Taking? Authorizing Provider  doxycycline (VIBRAMYCIN) 100 MG capsule Take 1 capsule (100 mg total) by mouth 2 (two) times daily. 10/31/20  Yes Isla Pence, MD  nystatin-triamcinolone ointment [facility]) Apply 1 application topically 2 (two) times daily as needed. 08/02/20   Mosie Lukes, MD  Vitamin D, Ergocalciferol, (DRISDOL) 1.25 MG (50000 UNIT) CAPS capsule TAKE 1 CAPSULE BY MOUTH EVERY 7 DAYS 08/20/20   Mosie Lukes, MD    Allergies    Patient has no known allergies.  Review of Systems   Review of Systems  HENT:       Right sided lymph nodes  Neurological: Positive for headaches.  All other systems reviewed and are negative.   Physical Exam Updated Vital Signs BP 134/78 (BP Location: Right Arm)   Pulse 69   Temp 97.9 F (36.6 C) (Oral)   Resp 17   Ht 5\' 5"  (1.651 m)   Wt 67.1 kg   SpO2 100%   BMI 24.63 kg/m   Physical Exam Vitals and nursing note reviewed.  Constitutional:      Appearance: Normal appearance.  HENT:  Head: Normocephalic.      Comments: Scratch with some surrounding cellulitis to posterior scalp.    Right Ear: Tympanic membrane, ear canal and external ear normal.     Left Ear: Tympanic membrane, ear canal and external ear normal.     Nose: Nose normal.     Mouth/Throat:     Mouth: Mucous membranes are moist.     Pharynx: Oropharynx is clear.  Eyes:     Extraocular Movements: Extraocular movements intact.     Conjunctiva/sclera: Conjunctivae normal.     Pupils: Pupils are equal, round, and reactive to light.  Cardiovascular:     Rate and Rhythm: Normal rate and regular rhythm.     Pulses: Normal pulses.     Heart sounds: Normal heart sounds.  Pulmonary:     Effort: Pulmonary effort is normal.     Breath sounds: Normal breath sounds.  Abdominal:     General: Abdomen is flat. Bowel sounds are normal.     Palpations: Abdomen is soft.  Musculoskeletal:        General: Normal range of motion.  Lymphadenopathy:      Cervical: Cervical adenopathy present.     Right cervical: Superficial cervical adenopathy present.  Skin:    General: Skin is warm.     Capillary Refill: Capillary refill takes less than 2 seconds.  Neurological:     General: No focal deficit present.     Mental Status: She is alert and oriented to person, place, and time.  Psychiatric:        Mood and Affect: Mood normal.        Behavior: Behavior normal.        Thought Content: Thought content normal.        Judgment: Judgment normal.     ED Results / Procedures / Treatments   Labs (all labs ordered are listed, but only abnormal results are displayed) Labs Reviewed  BASIC METABOLIC PANEL  CBC WITH DIFFERENTIAL/PLATELET    EKG None  Radiology CT Head Wo Contrast  Result Date: 10/31/2020 CLINICAL DATA:  Headache EXAM: CT HEAD WITHOUT CONTRAST TECHNIQUE: Contiguous axial images were obtained from the base of the skull through the vertex without intravenous contrast. COMPARISON:  None. FINDINGS: Brain: No evidence of acute infarction, hemorrhage, hydrocephalus, extra-axial collection or mass lesion/mass effect. Vascular: No hyperdense vessel or unexpected calcification. Skull: Normal. Negative for fracture or focal lesion. Sinuses/Orbits: No acute finding. Other: None. IMPRESSION: No acute intracranial abnormality. Electronically Signed   By: Valentino Saxon MD   On: 10/31/2020 17:28   CT Soft Tissue Neck W Contrast  Result Date: 10/31/2020 CLINICAL DATA:  Lymphadenopathy. EXAM: CT NECK WITH CONTRAST TECHNIQUE: Multidetector CT imaging of the neck was performed using the standard protocol following the bolus administration of intravenous contrast. CONTRAST:  171mL OMNIPAQUE IOHEXOL 300 MG/ML  SOLN COMPARISON:  None. FINDINGS: Pharynx and larynx: No focal enhancement or mass lesion. Patent airway. No vocal cord paralysis. Normal epiglottis. Nonspecific prominence of the palatine tonsils. Clear parapharyngeal fat. Salivary glands:  No inflammation, mass, or stone. Thyroid: Normal. Lymph nodes: Prominent to mildly enlarged right level 5 nodes measuring up to 7 mm with mild adjacent soft tissue stranding. Vascular: Normal intravascular enhancement. Limited intracranial: Grossly unremarkable. Visualized orbits: Normal orbits. Mastoids and visualized paranasal sinuses: Clear paranasal sinuses. No mastoid effusion. Skeleton: No acute or suspicious osseous abnormalities. Upper chest: Clear lung apices. Other: Right axillary postsurgical sequela. IMPRESSION: Prominent to mildly enlarged right level 5 cervical nodes  measuring up to 7 mm. Nonspecific prominence of the palatine tonsils. No evidence of tonsillar edema. Electronically Signed   By: Primitivo Gauze M.D.   On: 10/31/2020 18:13    Procedures Procedures   Medications Ordered in ED Medications  doxycycline (VIBRA-TABS) tablet 100 mg (100 mg Oral Given 10/31/20 1648)  iohexol (OMNIPAQUE) 300 MG/ML solution 100 mL (100 mLs Intravenous Contrast Given 10/31/20 1709)    ED Course  I have reviewed the triage vital signs and the nursing notes.  Pertinent labs & imaging results that were available during my care of the patient were reviewed by me and considered in my medical decision making (see chart for details).    MDM Rules/Calculators/A&P                          Pt's CT head nl.  CT soft tissue neck shows the lymphadenopathy, but no other abn.  She has the lad from the scratch on her head.  She will be d/c with doxy.  Return if worse.   F/u with pcp. Final Clinical Impression(s) / ED Diagnoses Final diagnoses:  Lymphadenopathy, anterior cervical    Rx / DC Orders ED Discharge Orders         Ordered    doxycycline (VIBRAMYCIN) 100 MG capsule  2 times daily        10/31/20 Elson Clan, MD 10/31/20 [phone]

## 2020-10-31 NOTE — ED Notes (Signed)
Pt. Has noted several knots in her Head on the R side and is having headache.  Pt. Reports feeling a striking headacghe at times and also has sore R side neck with lymph node swelling on the R neck.  Pt. Husband said he cuts her hair.  He said he scratched her head with a comb.

## 2020-10-31 NOTE — ED Notes (Signed)
Pt discharged to home. Discharge instructions have been discussed with patient and/or family members. Pt verbally acknowledges understanding d/c instructions, and endorses comprehension to checkout at registration before leaving.  °

## 2020-11-01 DIAGNOSIS — K621 Rectal polyp: Secondary | ICD-10-CM | POA: Diagnosis not present

## 2020-11-01 DIAGNOSIS — K635 Polyp of colon: Secondary | ICD-10-CM | POA: Diagnosis not present

## 2020-11-01 MED FILL — DOXYCYCLINE HYCLATE 100 MG: 100 | 7 days supply | Qty: 14 | Fill #0

## 2020-11-02 ENCOUNTER — Ambulatory Visit: Payer: Self-pay | Admitting: Surgery

## 2020-11-02 DIAGNOSIS — K828 Other specified diseases of gallbladder: Secondary | ICD-10-CM | POA: Diagnosis not present

## 2020-11-02 DIAGNOSIS — K839 Disease of biliary tract, unspecified: Secondary | ICD-10-CM | POA: Diagnosis not present
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
## 2020-12-31 ENCOUNTER — Other Ambulatory Visit (HOSPITAL_COMMUNITY)
Admission: RE | Admit: 2020-12-31 | Discharge: 2020-12-31 | Disposition: A | Discharge status: Home / Self Care | Payer: Medicare HMO | Source: Ambulatory Visit | Attending: Surgery | Admitting: Surgery

## 2020-12-31 DIAGNOSIS — Z01812 Encounter for preprocedural laboratory examination: Secondary | ICD-10-CM | POA: Insufficient documentation

## 2020-12-31 DIAGNOSIS — Z20822 Contact with and (suspected) exposure to covid-19: Secondary | ICD-10-CM | POA: Diagnosis not present

## 2021-01-01 LAB — SARS CORONAVIRUS 2 (TAT 6-24 HRS): SARS Coronavirus 2: NEGATIVE

## 2021-01-03 ENCOUNTER — Encounter (HOSPITAL_COMMUNITY): Payer: Self-pay | Admitting: Surgery

## 2021-01-03 NOTE — Progress Notes (Signed)
EKG:   na CXR: na ECHO: denies Stress Test: denies Cardiac Cath: denies  Fasting Blood Sugar- na Checks Blood Sugar__na_ times a day  OSA/CPAP: No  ASA/Blood Thinners: No  Covid test 12/31/20 negative  Anesthesia Review: No  Patient denies shortness of breath, fever, cough, and chest pain at PAT appointment.  Patient verbalized understanding of instructions provided today at the PAT appointment.  Patient asked to review instructions at home and day of surgery.

## 2021-01-03 NOTE — Anesthesia Preprocedure Evaluation (Addendum)
Anesthesia Evaluation  Patient identified by MRN, date of birth, ID band Patient awake    Reviewed: Allergy & Precautions, NPO status , Patient's Chart, lab work & pertinent test results  Airway Mallampati: II  TM Distance: >3 FB Neck ROM: Full    Dental  (+) Partial Upper   Pulmonary neg pulmonary ROS,    Pulmonary exam normal        Cardiovascular negative cardio ROS   Rhythm:Regular Rate:Normal     Neuro/Psych  Headaches, negative psych ROS   GI/Hepatic Neg liver ROS, Biliary sludge with dilated CBD   Endo/Other  negative endocrine ROS  Renal/GU negative Renal ROS  negative genitourinary   Musculoskeletal Breast cancer, right   Abdominal (+)  Abdomen: soft. Bowel sounds: normal.  Peds  Hematology negative hematology ROS (+)   Anesthesia Other Findings   Reproductive/Obstetrics                            Anesthesia Physical Anesthesia Plan  ASA: II  Anesthesia Plan: General   Post-op Pain Management:    Induction: Intravenous  PONV Risk Score and Plan: 3 and Ondansetron, Dexamethasone, Treatment may vary due to age or medical condition and Midazolam  Airway Management Planned: Mask and Oral ETT  Additional Equipment: None  Intra-op Plan:   Post-operative Plan: Extubation in OR  Informed Consent: I have reviewed the patients History and Physical, chart, labs and discussed the procedure including the risks, benefits and alternatives for the proposed anesthesia with the patient or authorized representative who has indicated his/her understanding and acceptance.     Dental advisory given  Plan Discussed with: CRNA  Anesthesia Plan Comments: (Lab Results      Component                Value               Date                      WBC                      6.6                 10/31/2020                HGB                      13.0                10/31/2020                 HCT                      39.3                10/31/2020                MCV                      89.9                10/31/2020                PLT                      274  10/31/2020           Lab Results      Component                Value               Date                      NA                       139                 10/31/2020                K                        3.8                 10/31/2020                CO2                      26                  10/31/2020                GLUCOSE                  99                  10/31/2020                BUN                      14                  10/31/2020                CREATININE               0.70                10/31/2020                CALCIUM                  9.6                 10/31/2020                GFRNONAA                 >60                 10/31/2020          )       Anesthesia Quick Evaluation

## 2021-01-04 ENCOUNTER — Encounter (HOSPITAL_COMMUNITY): Payer: Self-pay | Admitting: Surgery

## 2021-01-04 ENCOUNTER — Encounter (HOSPITAL_COMMUNITY)
Admission: RE | Disposition: A | Discharge status: Home / Self Care | Payer: Self-pay | Source: Ambulatory Visit | Attending: Surgery

## 2021-01-04 ENCOUNTER — Ambulatory Visit (HOSPITAL_COMMUNITY)
Admission: RE | Admit: 2021-01-04 | Discharge: 2021-01-04 | Disposition: A | Discharge status: Home / Self Care | Payer: Medicare HMO | Source: Ambulatory Visit | Attending: Surgery | Admitting: Surgery

## 2021-01-04 ENCOUNTER — Ambulatory Visit (HOSPITAL_COMMUNITY): Payer: Medicare HMO

## 2021-01-04 ENCOUNTER — Ambulatory Visit (HOSPITAL_COMMUNITY): Payer: Medicare HMO | Admitting: Anesthesiology

## 2021-01-04 DIAGNOSIS — K828 Other specified diseases of gallbladder: Secondary | ICD-10-CM | POA: Diagnosis present

## 2021-01-04 DIAGNOSIS — M8580 Other specified disorders of bone density and structure, unspecified site: Secondary | ICD-10-CM | POA: Diagnosis not present

## 2021-01-04 DIAGNOSIS — Z419 Encounter for procedure for purposes other than remedying health state, unspecified: Secondary | ICD-10-CM

## 2021-01-04 DIAGNOSIS — K8010 Calculus of gallbladder with chronic cholecystitis without obstruction: Secondary | ICD-10-CM | POA: Insufficient documentation

## 2021-01-04 DIAGNOSIS — K8020 Calculus of gallbladder without cholecystitis without obstruction: Secondary | ICD-10-CM | POA: Diagnosis not present

## 2021-01-04 DIAGNOSIS — E785 Hyperlipidemia, unspecified: Secondary | ICD-10-CM | POA: Diagnosis not present

## 2021-01-04 DIAGNOSIS — Z853 Personal history of malignant neoplasm of breast: Secondary | ICD-10-CM | POA: Diagnosis not present

## 2021-01-04 DIAGNOSIS — K839 Disease of biliary tract, unspecified: Secondary | ICD-10-CM | POA: Diagnosis present

## 2021-01-04 DIAGNOSIS — E559 Vitamin D deficiency, unspecified: Secondary | ICD-10-CM | POA: Diagnosis not present

## 2021-01-04 HISTORY — PX: CHOLECYSTECTOMY: SHX55

## 2021-01-04 LAB — CBC
HCT: 38.1 % (ref 36.0–46.0)
Hemoglobin: 12.5 g/dL (ref 12.0–15.0)
MCH: 30 pg (ref 26.0–34.0)
MCHC: 32.8 g/dL (ref 30.0–36.0)
MCV: 91.4 fL (ref 80.0–100.0)
Platelets: 279 10*3/uL (ref 150–400)
RBC: 4.17 MIL/uL (ref 3.87–5.11)
RDW: 12.6 % (ref 11.5–15.5)
WBC: 4.8 10*3/uL (ref 4.0–10.5)
nRBC: 0 % (ref 0.0–0.2)

## 2021-01-04 SURGERY — LAPAROSCOPIC CHOLECYSTECTOMY WITH INTRAOPERATIVE CHOLANGIOGRAM
Anesthesia: General | Site: Abdomen

## 2021-01-04 MED ORDER — ONDANSETRON HCL 4 MG/2ML IJ SOLN
INTRAMUSCULAR | Status: AC
  Filled 2021-01-04: qty 2

## 2021-01-04 MED ORDER — ROCURONIUM BROMIDE 10 MG/ML (PF) SYRINGE
PREFILLED_SYRINGE | INTRAVENOUS | Status: AC
  Filled 2021-01-04: qty 10

## 2021-01-04 MED ORDER — DEXAMETHASONE SODIUM PHOSPHATE 10 MG/ML IJ SOLN
INTRAMUSCULAR | Status: AC
  Filled 2021-01-04: qty 1

## 2021-01-04 MED ORDER — SUGAMMADEX SODIUM 200 MG/2ML IV SOLN
INTRAVENOUS | Status: DC | PRN
  Administered 2021-01-04: 200 mg via INTRAVENOUS

## 2021-01-04 MED ORDER — PROPOFOL 10 MG/ML IV BOLUS
INTRAVENOUS | Status: DC | PRN
  Administered 2021-01-04: 120 mg via INTRAVENOUS

## 2021-01-04 MED ORDER — LIDOCAINE 2% (20 MG/ML) 5 ML SYRINGE
INTRAMUSCULAR | Status: AC
  Filled 2021-01-04: qty 5

## 2021-01-04 MED ORDER — PROMETHAZINE HCL 25 MG/ML IJ SOLN: 6.2500 mg | INTRAMUSCULAR | Status: DC | PRN

## 2021-01-04 MED ORDER — ORAL CARE MOUTH RINSE: 15.0000 mL | Freq: Once | OROMUCOSAL | Status: AC

## 2021-01-04 MED ORDER — LIDOCAINE HCL (CARDIAC) PF 100 MG/5ML IV SOSY
PREFILLED_SYRINGE | INTRAVENOUS | Status: DC | PRN
  Administered 2021-01-04: 60 mg via INTRAVENOUS

## 2021-01-04 MED ORDER — PHENYLEPHRINE 40 MCG/ML (10ML) SYRINGE FOR IV PUSH (FOR BLOOD PRESSURE SUPPORT)
PREFILLED_SYRINGE | INTRAVENOUS | Status: AC
  Filled 2021-01-04: qty 10

## 2021-01-04 MED ORDER — ONDANSETRON HCL 4 MG/2ML IJ SOLN
INTRAMUSCULAR | Status: DC | PRN
  Administered 2021-01-04: 4 mg via INTRAVENOUS

## 2021-01-04 MED ORDER — CHLORHEXIDINE GLUCONATE 0.12 % MT SOLN
15.0000 mL | Freq: Once | OROMUCOSAL | Status: AC
  Administered 2021-01-04: 15 mL via OROMUCOSAL
  Filled 2021-01-04: qty 15

## 2021-01-04 MED ORDER — PROPOFOL 10 MG/ML IV BOLUS
INTRAVENOUS | Status: AC
  Filled 2021-01-04: qty 20

## 2021-01-04 MED ORDER — CHLORHEXIDINE GLUCONATE CLOTH 2 % EX PADS: 6.0000 | MEDICATED_PAD | Freq: Once | CUTANEOUS | Status: DC

## 2021-01-04 MED ORDER — PHENYLEPHRINE HCL (PRESSORS) 10 MG/ML IV SOLN: INTRAVENOUS | Status: DC | PRN

## 2021-01-04 MED ORDER — OXYCODONE HCL 5 MG PO TABS: 5.0000 mg | ORAL_TABLET | Freq: Four times a day (QID) | ORAL | Status: DC | PRN

## 2021-01-04 MED ORDER — FENTANYL CITRATE (PF) 100 MCG/2ML IJ SOLN: 25.0000 ug | INTRAMUSCULAR | Status: DC | PRN

## 2021-01-04 MED ORDER — CEFAZOLIN SODIUM-DEXTROSE 2-4 GM/100ML-% IV SOLN
2.0000 g | INTRAVENOUS | Status: AC
  Administered 2021-01-04: 2 g via INTRAVENOUS
  Filled 2021-01-04: qty 100

## 2021-01-04 MED ORDER — BUPIVACAINE-EPINEPHRINE 0.25% -1:200000 IJ SOLN
INTRAMUSCULAR | Status: DC | PRN
  Administered 2021-01-04: 9 mL

## 2021-01-04 MED ORDER — MIDAZOLAM HCL 2 MG/2ML IJ SOLN
INTRAMUSCULAR | Status: AC
  Filled 2021-01-04: qty 2

## 2021-01-04 MED ORDER — BUPIVACAINE-EPINEPHRINE (PF) 0.25% -1:200000 IJ SOLN
INTRAMUSCULAR | Status: AC
  Filled 2021-01-04: qty 30

## 2021-01-04 MED ORDER — FENTANYL CITRATE (PF) 100 MCG/2ML IJ SOLN
INTRAMUSCULAR | Status: DC | PRN
  Administered 2021-01-04: 25 ug via INTRAVENOUS
  Administered 2021-01-04: 75 ug via INTRAVENOUS

## 2021-01-04 MED ORDER — SODIUM CHLORIDE 0.9 % IV SOLN
INTRAVENOUS | Status: DC | PRN
  Administered 2021-01-04: 18 mL

## 2021-01-04 MED ORDER — DEXAMETHASONE SODIUM PHOSPHATE 10 MG/ML IJ SOLN
INTRAMUSCULAR | Status: DC | PRN
  Administered 2021-01-04: 10 mg via INTRAVENOUS

## 2021-01-04 MED ORDER — FENTANYL CITRATE (PF) 250 MCG/5ML IJ SOLN
INTRAMUSCULAR | Status: AC
  Filled 2021-01-04: qty 5

## 2021-01-04 MED ORDER — MIDAZOLAM HCL 2 MG/2ML IJ SOLN
INTRAMUSCULAR | Status: DC | PRN
  Administered 2021-01-04: .5 mg via INTRAVENOUS

## 2021-01-04 MED ORDER — KETOROLAC TROMETHAMINE 30 MG/ML IJ SOLN: 15.0000 mg | Freq: Once | INTRAMUSCULAR | Status: DC | PRN

## 2021-01-04 MED ORDER — LACTATED RINGERS IV SOLN: INTRAVENOUS | Status: DC

## 2021-01-04 MED ORDER — OXYCODONE HCL 5 MG PO TABS: 5.0000 mg | ORAL_TABLET | Freq: Four times a day (QID) | ORAL | 0 refills | Status: DC | PRN

## 2021-01-04 MED ORDER — KETOROLAC TROMETHAMINE 30 MG/ML IJ SOLN
INTRAMUSCULAR | Status: AC
  Filled 2021-01-04: qty 1

## 2021-01-04 MED ORDER — 0.9 % SODIUM CHLORIDE (POUR BTL) OPTIME
TOPICAL | Status: DC | PRN
  Administered 2021-01-04: 1000 mL

## 2021-01-04 MED ORDER — ROCURONIUM BROMIDE 100 MG/10ML IV SOLN
INTRAVENOUS | Status: DC | PRN
  Administered 2021-01-04: 40 mg via INTRAVENOUS

## 2021-01-04 MED ORDER — KETOROLAC TROMETHAMINE 15 MG/ML IJ SOLN
INTRAMUSCULAR | Status: DC | PRN
  Administered 2021-01-04: 15 mg via INTRAVENOUS

## 2021-01-04 MED ORDER — PHENYLEPHRINE HCL (PRESSORS) 10 MG/ML IV SOLN
INTRAVENOUS | Status: DC | PRN
  Administered 2021-01-04 (×2): 80 ug via INTRAVENOUS

## 2021-01-04 MED ORDER — ACETAMINOPHEN 500 MG PO TABS
1000.0000 mg | ORAL_TABLET | ORAL | Status: AC
  Administered 2021-01-04: 1000 mg via ORAL
  Filled 2021-01-04: qty 2

## 2021-01-04 MED ORDER — AMISULPRIDE (ANTIEMETIC) 5 MG/2ML IV SOLN: 10.0000 mg | Freq: Once | INTRAVENOUS | Status: DC | PRN

## 2021-01-04 MED ORDER — SODIUM CHLORIDE 0.9 % IR SOLN
Status: DC | PRN
  Administered 2021-01-04: 1000 mL

## 2021-01-04 SURGICAL SUPPLY — 44 items
APL PRP STRL LF DISP 70% ISPRP (MISCELLANEOUS) ×1
APL SKNCLS STERI-STRIP NONHPOA (GAUZE/BANDAGES/DRESSINGS) ×1
APPLIER CLIP ROT 10 11.4 M/L (STAPLE) ×2
APR CLP MED LRG 11.4X10 (STAPLE) ×1
BAG SPEC RTRVL 10 TROC 200 (ENDOMECHANICALS) ×1
BENZOIN TINCTURE PRP APPL 2/3 (GAUZE/BANDAGES/DRESSINGS) ×2 IMPLANT
CANISTER SUCT 3000ML PPV (MISCELLANEOUS) ×2 IMPLANT
CHLORAPREP W/TINT 26 (MISCELLANEOUS) ×2 IMPLANT
CLIP APPLIE ROT 10 11.4 M/L (STAPLE) ×1 IMPLANT
COVER MAYO STAND STRL (DRAPES) ×2 IMPLANT
COVER SURGICAL LIGHT HANDLE (MISCELLANEOUS) ×2 IMPLANT
DRAPE C-ARM 42X120 X-RAY (DRAPES) ×2 IMPLANT
DRSG TEGADERM 2-3/8X2-3/4 SM (GAUZE/BANDAGES/DRESSINGS) ×6 IMPLANT
DRSG TEGADERM 4X4.75 (GAUZE/BANDAGES/DRESSINGS) ×2 IMPLANT
ELECT REM PT RETURN 9FT ADLT (ELECTROSURGICAL) ×2
ELECTRODE REM PT RTRN 9FT ADLT (ELECTROSURGICAL) ×1 IMPLANT
GAUZE SPONGE 2X2 8PLY STRL LF (GAUZE/BANDAGES/DRESSINGS) ×1 IMPLANT
GLOVE BIO SURGEON STRL SZ7 (GLOVE) ×2 IMPLANT
GLOVE BIOGEL PI IND STRL 7.5 (GLOVE) ×1 IMPLANT
GLOVE BIOGEL PI INDICATOR 7.5 (GLOVE) ×1
GOWN STRL REUS W/ TWL LRG LVL3 (GOWN DISPOSABLE) ×3 IMPLANT
GOWN STRL REUS W/TWL LRG LVL3 (GOWN DISPOSABLE) ×6
KIT BASIN OR (CUSTOM PROCEDURE TRAY) ×2 IMPLANT
KIT TURNOVER KIT B (KITS) ×2 IMPLANT
NS IRRIG 1000ML POUR BTL (IV SOLUTION) ×2 IMPLANT
PAD ARMBOARD 7.5X6 YLW CONV (MISCELLANEOUS) ×2 IMPLANT
POUCH RETRIEVAL ECOSAC 10 (ENDOMECHANICALS) IMPLANT
POUCH RETRIEVAL ECOSAC 10MM (ENDOMECHANICALS) ×2
SCISSORS LAP 5X35 DISP (ENDOMECHANICALS) ×2 IMPLANT
SET CHOLANGIOGRAPH 5 50 .035 (SET/KITS/TRAYS/PACK) ×2 IMPLANT
SET IRRIG TUBING LAPAROSCOPIC (IRRIGATION / IRRIGATOR) ×2 IMPLANT
SET TUBE SMOKE EVAC HIGH FLOW (TUBING) ×2 IMPLANT
SLEEVE ENDOPATH XCEL 5M (ENDOMECHANICALS) ×2 IMPLANT
SPECIMEN JAR SMALL (MISCELLANEOUS) ×2 IMPLANT
SPONGE GAUZE 2X2 STER 10/PKG (GAUZE/BANDAGES/DRESSINGS) ×1
STRIP CLOSURE SKIN 1/2X4 (GAUZE/BANDAGES/DRESSINGS) ×2 IMPLANT
SUT MNCRL AB 4-0 PS2 18 (SUTURE) ×2 IMPLANT
TOWEL GREEN STERILE (TOWEL DISPOSABLE) ×2 IMPLANT
TOWEL GREEN STERILE FF (TOWEL DISPOSABLE) ×2 IMPLANT
TRAY LAPAROSCOPIC MC (CUSTOM PROCEDURE TRAY) ×2 IMPLANT
TROCAR XCEL BLUNT TIP 100MML (ENDOMECHANICALS) ×2 IMPLANT
TROCAR XCEL NON-BLD 11X100MML (ENDOMECHANICALS) ×2 IMPLANT
TROCAR XCEL NON-BLD 5MMX100MML (ENDOMECHANICALS) ×2 IMPLANT
WATER STERILE IRR 1000ML POUR (IV SOLUTION) ×2 IMPLANT

## 2021-01-04 NOTE — Transfer of Care (Signed)
Immediate Anesthesia Transfer of Care Note  Patient: Susan Chang  Procedure(s) Performed: LAPAROSCOPIC CHOLECYSTECTOMY WITH INTRAOPERATIVE CHOLANGIOGRAM (N/A Abdomen)  Patient Location: PACU  Anesthesia Type:General  Level of Consciousness: awake, alert  and oriented  Airway & Oxygen Therapy: Patient Spontanous Breathing and Patient connected to nasal cannula oxygen  Post-op Assessment: Report given to RN and Post -op Vital signs reviewed and stable  Post vital signs: Reviewed and stable  Last Vitals:  Vitals Value Taken Time  BP 127/61 01/04/21 1000  Temp    Pulse 67 01/04/21 1002  Resp 17 01/04/21 1002  SpO2 100 % 01/04/21 1002  Vitals shown include unvalidated device data.  Last Pain:  Vitals:   01/04/21 0640  TempSrc:   PainSc: 0-No pain      Patients Stated Pain Goal: 3 (27/03/[birth year] 0938)  Complications: No complications documented.

## 2021-01-04 NOTE — Anesthesia Postprocedure Evaluation (Signed)
Anesthesia Post Note  Patient: Susan Chang  Procedure(s) Performed: LAPAROSCOPIC CHOLECYSTECTOMY WITH INTRAOPERATIVE CHOLANGIOGRAM (N/A Abdomen)     Patient location during evaluation: PACU Anesthesia Type: General Level of consciousness: awake and alert Pain management: pain level controlled Vital Signs Assessment: post-procedure vital signs reviewed and stable Respiratory status: spontaneous breathing, nonlabored ventilation, respiratory function stable and patient connected to nasal cannula oxygen Cardiovascular status: blood pressure returned to baseline and stable Postop Assessment: no apparent nausea or vomiting Anesthetic complications: no   No complications documented.  Last Vitals:  Vitals:   01/04/21 1015 01/04/21 1030  BP: 134/70 132/73  Pulse: 64 (!) 57  Resp: 12 16  Temp:  36.7 C  SpO2: 100% 100%    Last Pain:  Vitals:   01/04/21 1030  TempSrc:   PainSc: 0-No pain                 Belenda Cruise P Elisheba Mcdonnell

## 2021-01-04 NOTE — Discharge Instructions (Signed)
[facility] ______[facility] °LAPAROSCOPIC SURGERY: POST OP INSTRUCTIONS °Always review your discharge instruction sheet given to you by the facility where your surgery was performed. °IF YOU HAVE DISABILITY OR FAMILY LEAVE FORMS, YOU MUST BRING THEM TO THE OFFICE FOR PROCESSING.   °DO NOT GIVE THEM TO YOUR DOCTOR. ° °1. A prescription for pain medication may be given to you upon discharge.  Take your pain medication as prescribed, if needed.  If narcotic pain medicine is not needed, then you may take acetaminophen (Tylenol) or ibuprofen (Advil) as needed. °2. Take your usually prescribed medications unless otherwise directed. °3. If you need a refill on your pain medication, please contact your pharmacy.  They will contact our office to request authorization. Prescriptions will not be filled after 5pm or on week-ends. °4. You should follow a light diet the first few days after arrival home, such as soup and crackers, etc.  Be sure to include lots of fluids daily. °5. Most patients will experience some swelling and bruising in the area of the incisions.  Ice packs will help.  Swelling and bruising can take several days to resolve.  °6. It is common to experience some constipation if taking pain medication after surgery.  Increasing fluid intake and taking a stool softener (such as Colace) will usually help or prevent this problem from occurring.  A mild laxative (Milk of Magnesia or Miralax) should be taken according to package instructions if there are no bowel movements after 48 hours. °7. Unless discharge instructions indicate otherwise, you may remove your bandages 24-48 hours after surgery, and you may shower at that time.  You may have steri-strips (small skin tapes) in place directly over the incision.  These strips should be left on the skin for 7-10 days.  If your surgeon used skin glue on the incision, you may shower in 24 hours.  The glue will flake off over the next 2-3 weeks.  Any sutures or  staples will be removed at the office during your follow-up visit. °8. ACTIVITIES:  You may resume regular (light) daily activities beginning the next day--such as daily self-care, walking, climbing stairs--gradually increasing activities as tolerated.  You may have sexual intercourse when it is comfortable.  Refrain from any heavy lifting or straining until approved by your doctor. °a. You may drive when you are no longer taking prescription pain medication, you can comfortably wear a seatbelt, and you can safely maneuver your car and apply brakes. °b. RETURN TO WORK:  __________________________________________________________ °9. You should see your doctor in the office for a follow-up appointment approximately 2-3 weeks after your surgery.  Make sure that you call for this appointment within a day or two after you arrive home to insure a convenient appointment time. °10. OTHER INSTRUCTIONS: __________________________________________________________________________________________________________________________ __________________________________________________________________________________________________________________________ °WHEN TO CALL YOUR DOCTOR: °1. Fever over 101.0 °2. Inability to urinate °3. Continued bleeding from incision. °4. Increased pain, redness, or drainage from the incision. °5. Increasing abdominal pain ° °The clinic staff is available to answer your questions during regular business hours.  Please don’t hesitate to call and ask to speak to one of the nurses for clinical concerns.  If you have a medical emergency, go to the nearest emergency room or call 911.  A surgeon from [facility] is always on call at the hospital. °[address], [address], [place], [place]  [address] ? [address] [address], [place], [place]   [address] °[phone] ? [phone] ? FAX [phone] °Web site:   [email or web address] °
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
## 2021-01-04 NOTE — Anesthesia Procedure Notes (Signed)
Procedure Name: Intubation Date/Time: 01/04/2021 8:14 AM Performed by: Bufford Spikes, CRNA Pre-anesthesia Checklist: Patient identified, Emergency Drugs available, Suction available and Patient being monitored Patient Re-evaluated:Patient Re-evaluated prior to induction Oxygen Delivery Method: Circle system utilized Preoxygenation: Pre-oxygenation with 100% oxygen Induction Type: IV induction Ventilation: Mask ventilation without difficulty Laryngoscope Size: Miller and 2 Grade View: Grade II Tube type: Oral Tube size: 7.0 mm Number of attempts: 1 Airway Equipment and Method: Stylet and Oral airway Placement Confirmation: ETT inserted through vocal cords under direct vision,  positive ETCO2 and breath sounds checked- equal and bilateral Secured at: 21 cm Tube secured with: Tape Dental Injury: Teeth and Oropharynx as per pre-operative assessment

## 2021-01-04 NOTE — Op Note (Addendum)
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 The finding was incidental during a trauma workup but given the malignancy risk the patient requests that we proceed with cholecystectomy.  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 Donnie Mesa, MD  Resident: Dr. Wynelle Link, MD  I was present for the critical and key portions of the surgery and I was immediately available throughout the entire procedure.  I have reviewed and agree with the operative note as documented by the resident.   Anesthesia: General endotracheal anesthesia  ASA Class: 2  Procedure Details  The patient was seen again in the Holding Room. The risks, benefits, complications, treatment options, and expected outcomes were discussed with the patient. The possibilities of reaction to medication, pulmonary aspiration, perforation of viscus, bleeding, recurrent infection, finding a normal gallbladder, the need for additional procedures, failure to diagnose a condition, the possible need to convert to an open procedure, and creating a complication requiring transfusion or operation were discussed with the patient. The patient and concurred with the proposed plan, giving informed consent. The patient was taken to Operating Room, identified as Susan Chang and the procedure verified as Laparoscopic Cholecystectomy with Intraoperative Cholangiogram. A Time Out was held and the above information confirmed.  Prior to the induction of general anesthesia, antibiotic prophylaxis was administered. General endotracheal anesthesia was then administered and tolerated well. After  the induction, the abdomen was prepped with Chloraprep and draped in the sterile fashion. The patient was positioned in the supine position.  Local anesthetic agent was injected into the skin near the umbilicus and an incision made. We dissected down to the abdominal fascia with blunt dissection.  The fascia was incised vertically and we entered the peritoneal cavity bluntly.  A pursestring suture of 0-Vicryl was placed around the fascial opening.  The Hasson cannula was inserted and secured with the stay suture.  Pneumoperitoneum was then created with CO2 and tolerated well without any adverse changes in the patient's vital signs. An 11-mm port was placed in the subxiphoid position.  Two 5-mm ports were placed in the right upper quadrant. All skin incisions were infiltrated with a local anesthetic agent before making the incision and placing the trocars.   We positioned the patient in reverse Trendelenburg.  The gallbladder was identified, the fundus grasped and retracted cephalad. There were mature omental adhesions along the lateral aspect of the gallbladder. Adhesions were lysed bluntly and with the electrocautery where indicated, taking care not to injure any adjacent organs or viscus. The infundibulum was grasped and retracted laterally, exposing the peritoneum overlying the triangle of Calot. This was then divided and exposed in a blunt fashion. A critical view of the cystic duct and cystic artery was obtained.  The cystic duct was clearly identified and bluntly dissected circumferentially. The cystic duct was ligated with a clip proximally.   An incision was made in the cystic duct and the [facility] cholangiogram catheter introduced. The catheter was secured using a clip. A cholangiogram was then obtained which showed good visualization of the distal and proximal biliary tree with no sign of filling defects or obstruction.  Contrast flowed easily into the duodenum. The catheter was then removed.   The  cystic duct was then ligated to leave three metal clips and divided. The cystic artery was  identified, dissected free, ligated with two clips on the stay side and divided as well.   The gallbladder was dissected from the liver bed in retrograde fashion with the electrocautery. There was a small amount of cloudy-appearing bile that was spilled and suctioned until clear. The gallbladder was removed and placed in a laparoscopic retrieval bag. The liver bed was irrigated and inspected. Hemostasis was achieved with the electrocautery. Copious irrigation was utilized and was repeatedly aspirated until clear.  The gallbladder and laparoscopic retrieval bag were then removed through the umbilical port site after gentle blunt dilation at the level of the fascia to permit removal of the gallbladder.  The pursestring suture was used to close the umbilical fascia.    We again inspected the right upper quadrant for hemostasis.  Pneumoperitoneum was released as we removed the trocars.  4-0 Monocryl was used to close the skin.   Benzoin, steri-strips, and clean dressings were applied. The patient was then extubated and brought to the recovery room in stable condition. Instrument, sponge, and needle counts were correct at closure and at the conclusion of the case.   Findings:  - very large, dilated, thick-walled gallbladder; firm and difficult retraction  - distorted anatomy owing to the large nature of the fundus with single cystic artery entering gallbladder to the right of the cystic duct - critical view of safety obtained with two and only two structures entering gallbladder  - normal-appearing liver grossly  - normal intraoperative cholangiogram  Estimated Blood Loss: Minimal         Drains: None         Specimens: Gallbladder           Complications: None; patient tolerated the procedure well.         Disposition: PACU - hemodynamically stable.         Condition: stable  Susan Chang. Susan Dover, MD,  [facility]  General/ Trauma Surgery   01/04/2021 10:20 AM

## 2021-01-05 ENCOUNTER — Encounter (HOSPITAL_COMMUNITY): Payer: Self-pay | Admitting: Surgery

## 2021-01-05 LAB — SURGICAL PATHOLOGY

## 2021-01-28 DIAGNOSIS — Z9049 Acquired absence of other specified parts of digestive tract: Secondary | ICD-10-CM | POA: Diagnosis not present

## 2021-01-28 DIAGNOSIS — K828 Other specified diseases of gallbladder: Secondary | ICD-10-CM | POA: Diagnosis not present

## 2021-01-28 DIAGNOSIS — K839 Disease of biliary tract, unspecified: Secondary | ICD-10-CM | POA: Diagnosis not present

## 2021-01-29 ENCOUNTER — Encounter: Payer: Self-pay | Admitting: Family Medicine

## 2021-02-01 NOTE — Telephone Encounter (Signed)
Pt called and VV scheduled ?

## 2021-02-02 ENCOUNTER — Inpatient Hospital Stay (HOSPITAL_BASED_OUTPATIENT_CLINIC_OR_DEPARTMENT_OTHER)
Admission: EM | Admit: 2021-02-02 | Discharge: 2021-02-05 | DRG: 393 | Disposition: A | Discharge status: Home / Self Care | Payer: Medicare HMO | Attending: Gastroenterology | Admitting: Gastroenterology

## 2021-02-02 ENCOUNTER — Emergency Department (HOSPITAL_BASED_OUTPATIENT_CLINIC_OR_DEPARTMENT_OTHER): Payer: Medicare HMO

## 2021-02-02 ENCOUNTER — Other Ambulatory Visit: Payer: Self-pay

## 2021-02-02 ENCOUNTER — Encounter (HOSPITAL_BASED_OUTPATIENT_CLINIC_OR_DEPARTMENT_OTHER): Payer: Self-pay | Admitting: Emergency Medicine

## 2021-02-02 ENCOUNTER — Other Ambulatory Visit: Payer: Self-pay | Admitting: Surgery

## 2021-02-02 DIAGNOSIS — E559 Vitamin D deficiency, unspecified: Secondary | ICD-10-CM | POA: Diagnosis not present

## 2021-02-02 DIAGNOSIS — Z801 Family history of malignant neoplasm of trachea, bronchus and lung: Secondary | ICD-10-CM | POA: Diagnosis not present

## 2021-02-02 DIAGNOSIS — R1010 Upper abdominal pain, unspecified: Secondary | ICD-10-CM | POA: Diagnosis not present

## 2021-02-02 DIAGNOSIS — R1011 Right upper quadrant pain: Secondary | ICD-10-CM

## 2021-02-02 DIAGNOSIS — R109 Unspecified abdominal pain: Secondary | ICD-10-CM | POA: Diagnosis not present

## 2021-02-02 DIAGNOSIS — Z8249 Family history of ischemic heart disease and other diseases of the circulatory system: Secondary | ICD-10-CM | POA: Diagnosis not present

## 2021-02-02 DIAGNOSIS — R188 Other ascites: Secondary | ICD-10-CM | POA: Diagnosis not present

## 2021-02-02 DIAGNOSIS — K838 Other specified diseases of biliary tract: Secondary | ICD-10-CM | POA: Diagnosis not present

## 2021-02-02 DIAGNOSIS — Z20822 Contact with and (suspected) exposure to covid-19: Secondary | ICD-10-CM | POA: Diagnosis present

## 2021-02-02 DIAGNOSIS — K7689 Other specified diseases of liver: Secondary | ICD-10-CM | POA: Diagnosis not present

## 2021-02-02 DIAGNOSIS — K659 Peritonitis, unspecified: Secondary | ICD-10-CM | POA: Diagnosis present

## 2021-02-02 DIAGNOSIS — Z853 Personal history of malignant neoplasm of breast: Secondary | ICD-10-CM | POA: Diagnosis not present

## 2021-02-02 DIAGNOSIS — G43909 Migraine, unspecified, not intractable, without status migrainosus: Secondary | ICD-10-CM | POA: Diagnosis not present

## 2021-02-02 DIAGNOSIS — K9189 Other postprocedural complications and disorders of digestive system: Principal | ICD-10-CM | POA: Diagnosis present

## 2021-02-02 DIAGNOSIS — Z419 Encounter for procedure for purposes other than remedying health state, unspecified: Secondary | ICD-10-CM

## 2021-02-02 DIAGNOSIS — Y838 Other surgical procedures as the cause of abnormal reaction of the patient, or of later complication, without mention of misadventure at the time of the procedure: Secondary | ICD-10-CM | POA: Diagnosis not present

## 2021-02-02 DIAGNOSIS — R748 Abnormal levels of other serum enzymes: Secondary | ICD-10-CM | POA: Diagnosis not present

## 2021-02-02 DIAGNOSIS — R17 Unspecified jaundice: Secondary | ICD-10-CM | POA: Diagnosis not present

## 2021-02-02 DIAGNOSIS — Z9049 Acquired absence of other specified parts of digestive tract: Secondary | ICD-10-CM

## 2021-02-02 DIAGNOSIS — E785 Hyperlipidemia, unspecified: Secondary | ICD-10-CM | POA: Diagnosis present

## 2021-02-02 DIAGNOSIS — Z823 Family history of stroke: Secondary | ICD-10-CM

## 2021-02-02 DIAGNOSIS — Z9889 Other specified postprocedural states: Secondary | ICD-10-CM

## 2021-02-02 DIAGNOSIS — R932 Abnormal findings on diagnostic imaging of liver and biliary tract: Secondary | ICD-10-CM | POA: Diagnosis not present

## 2021-02-02 DIAGNOSIS — K839 Disease of biliary tract, unspecified: Secondary | ICD-10-CM | POA: Diagnosis not present

## 2021-02-02 DIAGNOSIS — R945 Abnormal results of liver function studies: Secondary | ICD-10-CM | POA: Diagnosis not present

## 2021-02-02 LAB — URINALYSIS, MICROSCOPIC (REFLEX): Squamous Epithelial / HPF: NONE SEEN (ref 0–5)

## 2021-02-02 LAB — CBC WITH DIFFERENTIAL/PLATELET
Abs Immature Granulocytes: 0.02 10*3/uL (ref 0.00–0.07)
Basophils Absolute: 0.1 10*3/uL (ref 0.0–0.1)
Basophils Relative: 1 %
Eosinophils Absolute: 0.1 10*3/uL (ref 0.0–0.5)
Eosinophils Relative: 1 %
HCT: 36.4 % (ref 36.0–46.0)
Hemoglobin: 11.8 g/dL — ABNORMAL LOW (ref 12.0–15.0)
Immature Granulocytes: 0 %
Lymphocytes Relative: 23 %
Lymphs Abs: 1.6 10*3/uL (ref 0.7–4.0)
MCH: 30.2 pg (ref 26.0–34.0)
MCHC: 32.4 g/dL (ref 30.0–36.0)
MCV: 93.1 fL (ref 80.0–100.0)
Monocytes Absolute: 0.6 10*3/uL (ref 0.1–1.0)
Monocytes Relative: 8 %
Neutro Abs: 4.6 10*3/uL (ref 1.7–7.7)
Neutrophils Relative %: 67 %
Platelets: 423 10*3/uL — ABNORMAL HIGH (ref 150–400)
RBC: 3.91 MIL/uL (ref 3.87–5.11)
RDW: 13.2 % (ref 11.5–15.5)
WBC: 6.9 10*3/uL (ref 4.0–10.5)
nRBC: 0 % (ref 0.0–0.2)

## 2021-02-02 LAB — RESP PANEL BY RT-PCR (FLU A&B, COVID) ARPGX2
Influenza A by PCR: NEGATIVE
Influenza B by PCR: NEGATIVE
SARS Coronavirus 2 by RT PCR: NEGATIVE

## 2021-02-02 LAB — URINALYSIS, ROUTINE W REFLEX MICROSCOPIC
Bilirubin Urine: NEGATIVE
Glucose, UA: NEGATIVE mg/dL
Ketones, ur: NEGATIVE mg/dL
Leukocytes,Ua: NEGATIVE
Nitrite: NEGATIVE
Protein, ur: NEGATIVE mg/dL
Specific Gravity, Urine: 1.01 (ref 1.005–1.030)
pH: 7 (ref 5.0–8.0)

## 2021-02-02 LAB — LIPASE, BLOOD: Lipase: 31 U/L (ref 11–51)

## 2021-02-02 LAB — COMPREHENSIVE METABOLIC PANEL
ALT: 22 U/L (ref 0–44)
AST: 26 U/L (ref 15–41)
Albumin: 3.8 g/dL (ref 3.5–5.0)
Alkaline Phosphatase: 919 U/L — ABNORMAL HIGH (ref 38–126)
Anion gap: 8 (ref 5–15)
BUN: 10 mg/dL (ref 8–23)
CO2: 28 mmol/L (ref 22–32)
Calcium: 9 mg/dL (ref 8.9–10.3)
Chloride: 101 mmol/L (ref 98–111)
Creatinine, Ser: 0.79 mg/dL (ref 0.44–1.00)
GFR, Estimated: >60 mL/min (ref >60)
Glucose, Bld: 105 mg/dL — ABNORMAL HIGH (ref 70–99)
Potassium: 3.6 mmol/L (ref 3.5–5.1)
Sodium: 137 mmol/L (ref 135–145)
Total Bilirubin: 1.6 mg/dL — ABNORMAL HIGH (ref 0.3–1.2)
Total Protein: 7.5 g/dL (ref 6.5–8.1)

## 2021-02-02 MED ORDER — KETOROLAC TROMETHAMINE 15 MG/ML IJ SOLN: 15.0000 mg | Freq: Four times a day (QID) | INTRAMUSCULAR | Status: DC | PRN

## 2021-02-02 MED ORDER — PIPERACILLIN-TAZOBACTAM 3.375 G IVPB
3.3750 g | Freq: Three times a day (TID) | INTRAVENOUS | Status: DC
  Administered 2021-02-02 – 2021-02-03 (×3): 3.375 g via INTRAVENOUS
  Filled 2021-02-02 (×3): qty 50

## 2021-02-02 MED ORDER — PIPERACILLIN-TAZOBACTAM 3.375 G IVPB 30 MIN
3.3750 g | Freq: Once | INTRAVENOUS | Status: AC
  Administered 2021-02-02: 3.375 g via INTRAVENOUS
  Filled 2021-02-02: qty 50

## 2021-02-02 MED ORDER — PIPERACILLIN-TAZOBACTAM 3.375 G IVPB: 3.3750 g | Freq: Three times a day (TID) | INTRAVENOUS | Status: DC

## 2021-02-02 MED ORDER — FENTANYL CITRATE (PF) 100 MCG/2ML IJ SOLN
50.0000 ug | Freq: Once | INTRAMUSCULAR | Status: AC
  Administered 2021-02-02: 50 ug via INTRAVENOUS
  Filled 2021-02-02: qty 2

## 2021-02-02 MED ORDER — MORPHINE SULFATE (PF) 2 MG/ML IV SOLN: 2.0000 mg | INTRAVENOUS | Status: DC | PRN

## 2021-02-02 MED ORDER — ONDANSETRON HCL 4 MG/2ML IJ SOLN: 4.0000 mg | Freq: Four times a day (QID) | INTRAMUSCULAR | Status: DC | PRN

## 2021-02-02 MED ORDER — OXYCODONE HCL 5 MG PO TABS: 5.0000 mg | ORAL_TABLET | ORAL | Status: DC | PRN

## 2021-02-02 MED ORDER — DIPHENHYDRAMINE HCL 50 MG/ML IJ SOLN: 12.5000 mg | Freq: Four times a day (QID) | INTRAMUSCULAR | Status: DC | PRN

## 2021-02-02 MED ORDER — ONDANSETRON HCL 4 MG/2ML IJ SOLN
4.0000 mg | Freq: Once | INTRAMUSCULAR | Status: AC
  Administered 2021-02-02: 4 mg via INTRAVENOUS
  Filled 2021-02-02: qty 2

## 2021-02-02 MED ORDER — SODIUM CHLORIDE 0.9 % IV SOLN: INTRAVENOUS | Status: DC | PRN

## 2021-02-02 MED ORDER — IOHEXOL 300 MG/ML  SOLN
100.0000 mL | Freq: Once | INTRAMUSCULAR | Status: AC | PRN
  Administered 2021-02-02: 100 mL via INTRAVENOUS

## 2021-02-02 MED ORDER — ACETAMINOPHEN 325 MG PO TABS: 650.0000 mg | ORAL_TABLET | Freq: Four times a day (QID) | ORAL | Status: DC | PRN

## 2021-02-02 MED ORDER — DIPHENHYDRAMINE HCL 12.5 MG/5ML PO ELIX: 12.5000 mg | ORAL_SOLUTION | Freq: Four times a day (QID) | ORAL | Status: DC | PRN

## 2021-02-02 MED ORDER — SODIUM CHLORIDE 0.9 % IV BOLUS
1000.0000 mL | Freq: Once | INTRAVENOUS | Status: AC
  Administered 2021-02-02: 1000 mL via INTRAVENOUS

## 2021-02-02 MED ORDER — METOPROLOL TARTRATE 5 MG/5ML IV SOLN: 5.0000 mg | Freq: Four times a day (QID) | INTRAVENOUS | Status: DC | PRN

## 2021-02-02 MED ORDER — ONDANSETRON 4 MG PO TBDP: 4.0000 mg | ORAL_TABLET | Freq: Four times a day (QID) | ORAL | Status: DC | PRN

## 2021-02-02 MED ORDER — KCL IN DEXTROSE-NACL 10-5-0.45 MEQ/L-%-% IV SOLN
INTRAVENOUS | Status: DC
  Filled 2021-02-02 (×2): qty 1000

## 2021-02-02 NOTE — ED Triage Notes (Signed)
Right sided intermittent abdominal pain since May 3 after cholecystectomy.  Pt states she stopped pain medication after three days due to constipation.  Pt took miralax then started having diarrhea.  Pt continues to have diarrhea intermittently, continues to have pain.  Pt has been on pancrease x 1 week which has improved some of the symptoms, but continues with pain and diarrhea.  No recent fever, had fever intimally post-op.

## 2021-02-02 NOTE — Progress Notes (Signed)
Pharmacy Antibiotic Note  Susan Chang is a 70 y.o. female admitted on 02/02/2021 with right-sided intermittent abdominal pain after a cholecystectomy.  Pharmacy has been consulted to dose piperacillin-tazobactam for possible intra-abdominal infection.  Patient is afebrile and WBC wnl 6.9. Kidney function is at baseline with SCr 0.79mg /dL and CrCl is ~76mL/min.  Plan:  Initiate piperacillin-tazobactam 3.375g IV every 8 hours.  Follow up with cultures, antibiotic de-escalation and LOT Monitor renal function and clinical progress  Height: 5\' 5"  (165.1 cm) Weight: 64 kg (141 lb) IBW/kg (Calculated) : 57  Temp (24hrs), Avg:98.2 F (36.8 C), Min:98.2 F (36.8 C), Max:98.2 F (36.8 C)  Recent Labs  Lab 02/02/21 0815  WBC 6.9  CREATININE 0.79    Estimated Creatinine Clearance: 61.4 mL/min (by C-G formula based on SCr of 0.79 mg/dL).    No Known Allergies  Antimicrobials this admission: Pip-tazo 6/1 >>   Thank you for allowing pharmacy to be a part of this patient's care.  Mercy Riding, PharmD PGY1 Acute Care Pharmacy Resident Please refer to [facility] for unit-specific pharmacist

## 2021-02-02 NOTE — Progress Notes (Signed)
Pt would like to have MD other than MD that completed original surgery.

## 2021-02-02 NOTE — Progress Notes (Signed)
Received report from [facility] ED RN, Barnett Applebaum.  Pt will be coming via personal vehicle.

## 2021-02-02 NOTE — Telephone Encounter (Signed)
Labs printed and placed in Dr.Blyth's basket

## 2021-02-02 NOTE — H&P (Addendum)
Susan Chang Mar 02, 1951  [id number].    Requesting MD: Dr. Billy Fischer Chief Complaint/Reason for Consult: RUQ abdominal pain  HPI: Susan Chang is a 70 y.o. female who was transferred from [facility] after presenting with RUQ abdominal pain following Laparoscopic Cholecystectomy with IOC on 01/04/21 by Dr. Georgette Dover. IOC negative. Patient discharged POD 0. Patient reports that over the last few days she has had worsening, constant right upper abdominal pain with nausea. Pain is worse with eating. Denies fever, chills, vomiting or diarrhea. She has had intermittent symptoms of pain for the last month since surgery but has felt the worst in the last week. She reports having some diarrhea about a week ago as well but this has improved some. She also noted some yellowing of her skin last week but reports this has improved. She had some outpatient labs that her husband was able to pull up for me to see with Alk Phos ~1225 and Tbili of 2.0.  Presented to the ED and had a CT A/P that showed small volume of ascites and areas of peritoneal enhancement but no unexpected postoperative fluid collection in the gallbladder fossa. WBC 6.9. Alk phos 919. T. Bili 1.6. AST/ALT and Lipase wnl. Prior to surgery patient was otherwise healthy and does not take medications regularly.   ROS: Review of Systems  Constitutional: Negative for chills and fever.  Respiratory: Negative for shortness of breath and wheezing.   Cardiovascular: Negative for chest pain and palpitations.  Gastrointestinal: Positive for abdominal pain, diarrhea and nausea. Negative for vomiting.  Genitourinary: Negative for dysuria, frequency and urgency.  All other systems reviewed and are negative.   Family History  Problem Relation Age of Onset  . Cancer Mother        Breast cancer  . Hypertension Mother   . Stroke Mother   . Cancer Father        lung cancer  . Cancer Paternal Aunt        breast cancer  . Cancer Brother     Past  Medical History:  Diagnosis Date  . Breast cancer ([place]) 2000   right  . Osteopenia   . Preventative health care 07/13/2016  . Vitamin D deficiency 11/14/2015    Past Surgical History:  Procedure Laterality Date  . ABDOMINAL HYSTERECTOMY  2001   TAH b/l SPO for ovarian cyst,  . APPENDECTOMY    . BREAST BIOPSY Right 2000  . BREAST SURGERY  2000   cancer, right lumpectomy  . CHOLECYSTECTOMY N/A 01/04/2021   Procedure: LAPAROSCOPIC CHOLECYSTECTOMY WITH INTRAOPERATIVE CHOLANGIOGRAM;  Surgeon: Donnie Mesa, MD;  Location: [facility];  Service: General;  Laterality: N/A;  . LYMPH NODE DISSECTION Right 2000    Social History:  reports that she has never smoked. She has never used smokeless tobacco. She reports previous alcohol use. She reports that she does not use drugs.  Allergies: No Known Allergies  Medications Prior to Admission  Medication Sig Dispense Refill  . Vitamin D, Ergocalciferol, (DRISDOL) 1.25 MG (50000 UNIT) CAPS capsule TAKE 1 CAPSULE BY MOUTH EVERY 7 DAYS (Patient taking differently: Take 50,000 Units by mouth every 7 (seven) days.) 4 capsule 4     Physical Exam: Blood pressure (!) 157/87, pulse 70, temperature 98.6 F (37 C), temperature source Oral, resp. rate 18, height 5' 5"  (1.651 m), weight 64 kg, SpO2 100 %. General: pleasant, WD/WN female who is laying in bed in NAD HEENT: head is normocephalic, atraumatic.  Sclera are anicteric.  Heart: regular, rate, and rhythm. Palpable pedal pulses bilaterally  Lungs: CTAB, no wheezes, rhonchi, or rales noted.  Respiratory effort nonlabored Abd: Soft, ND, RUQ tenderness without peritonitis. +BS, no masses, hernias, or organomegaly. Incisions intact appear well and are without drainage, bleeding, or signs of infection MS: no BUE/BLE edema, calves soft and nontender Skin: warm and dry with no masses, lesions, or rashes Psych: A&Ox4 with an appropriate affect Neuro: cranial nerves grossly intact, normal speech, thought process  intact  Results for orders placed or performed during the hospital encounter of 02/02/21 (from the past 48 hour(s))  CBC with Differential     Status: Abnormal   Collection Time: 02/02/21  8:15 AM  Result Value Ref Range   WBC 6.9 4.0 - 10.5 K/uL   RBC 3.91 3.87 - 5.11 MIL/uL   Hemoglobin 11.8 (L) 12.0 - 15.0 g/dL   HCT 36.4 36.0 - 46.0 %   MCV 93.1 80.0 - 100.0 fL   MCH 30.2 26.0 - 34.0 pg   MCHC 32.4 30.0 - 36.0 g/dL   RDW 13.2 11.5 - 15.5 %   Platelets 423 (H) 150 - 400 K/uL   nRBC 0.0 0.0 - 0.2 %   Neutrophils Relative % 67 %   Neutro Abs 4.6 1.7 - 7.7 K/uL   Lymphocytes Relative 23 %   Lymphs Abs 1.6 0.7 - 4.0 K/uL   Monocytes Relative 8 %   Monocytes Absolute 0.6 0.1 - 1.0 K/uL   Eosinophils Relative 1 %   Eosinophils Absolute 0.1 0.0 - 0.5 K/uL   Basophils Relative 1 %   Basophils Absolute 0.1 0.0 - 0.1 K/uL   Immature Granulocytes 0 %   Abs Immature Granulocytes 0.02 0.00 - 0.07 K/uL    Comment: Performed at [facility], [address] [place], [place] [address]  Comprehensive metabolic panel     Status: Abnormal   Collection Time: 02/02/21  8:15 AM  Result Value Ref Range   Sodium 137 135 - 145 mmol/L   Potassium 3.6 3.5 - 5.1 mmol/L   Chloride 101 98 - 111 mmol/L   CO2 28 22 - 32 mmol/L   Glucose, Bld 105 (H) 70 - 99 mg/dL    Comment: Glucose reference range applies only to samples taken after fasting for at least 8 hours.   BUN 10 8 - 23 mg/dL   Creatinine, Ser 0.79 0.44 - 1.00 mg/dL   Calcium 9.0 8.9 - 10.3 mg/dL   Total Protein 7.5 6.5 - 8.1 g/dL   Albumin 3.8 3.5 - 5.0 g/dL   AST 26 15 - 41 U/L   ALT 22 0 - 44 U/L   Alkaline Phosphatase 919 (H) 38 - 126 U/L   Total Bilirubin 1.6 (H) 0.3 - 1.2 mg/dL   GFR, Estimated >60 >60 mL/min    Comment: (NOTE) Calculated using the CKD-EPI Creatinine Equation (2021)    Anion gap 8 5 - 15    Comment: Performed at [facility], [address] [place], [place] [address]  Lipase, blood      Status: None   Collection Time: 02/02/21  8:15 AM  Result Value Ref Range   Lipase 31 11 - 51 U/L    Comment: Performed at [facility], [address] [place], [place] [address]  Urinalysis, Routine w reflex microscopic Urine, Clean Catch     Status: Abnormal   Collection Time: 02/02/21  8:15 AM  Result Value Ref  Range   Color, Urine YELLOW YELLOW   APPearance CLEAR CLEAR   Specific Gravity, Urine 1.010 1.005 - 1.030   pH 7.0 5.0 - 8.0   Glucose, UA NEGATIVE NEGATIVE mg/dL   Hgb urine dipstick TRACE (A) NEGATIVE   Bilirubin Urine NEGATIVE NEGATIVE   Ketones, ur NEGATIVE NEGATIVE mg/dL   Protein, ur NEGATIVE NEGATIVE mg/dL   Nitrite NEGATIVE NEGATIVE   Leukocytes,Ua NEGATIVE NEGATIVE    Comment: Performed at [facility], [address] [place], [place] [address]  Urinalysis, Microscopic (reflex)     Status: Abnormal   Collection Time: 02/02/21  8:15 AM  Result Value Ref Range   RBC / HPF 0-5 0 - 5 RBC/hpf   WBC, UA 0-5 0 - 5 WBC/hpf   Bacteria, UA RARE (A) NONE SEEN   Squamous Epithelial / LPF NONE SEEN 0 - 5   Mucus PRESENT     Comment: Performed at [facility], [address] [place], [place] [address]  Resp Panel by RT-PCR (Flu A&B, Covid) Nasopharyngeal Swab     Status: None   Collection Time: 02/02/21 12:26 PM   Specimen: Nasopharyngeal Swab; Nasopharyngeal(NP) swabs in vial transport medium  Result Value Ref Range   SARS Coronavirus 2 by RT PCR NEGATIVE NEGATIVE    Comment: (NOTE) SARS-CoV-2 target nucleic acids are NOT DETECTED.  The SARS-CoV-2 RNA is generally detectable in upper respiratory specimens during the acute phase of infection. The lowest concentration of SARS-CoV-2 viral copies this assay can detect is 138 copies/mL. A negative result does not preclude SARS-Cov-2 infection and should not be used as the sole basis for treatment or other patient management decisions. A negative result may occur with  improper specimen  collection/handling, submission of specimen other than nasopharyngeal swab, presence of viral mutation(s) within the areas targeted by this assay, and inadequate number of viral copies(<138 copies/mL). A negative result must be combined with clinical observations, patient history, and epidemiological information. The expected result is Negative.  Fact Sheet for Patients:  EntrepreneurPulse.com.au  Fact Sheet for Healthcare Providers:  IncredibleEmployment.be  This test is no t yet approved or cleared by the [place] FDA and  has been authorized for detection and/or diagnosis of SARS-CoV-2 by FDA under an Emergency Use Authorization (EUA). This EUA will remain  in effect (meaning this test can be used) for the duration of the COVID-19 declaration under Section 564(b)(1) of the Act, 21 U.S.C.section 360bbb-3(b)(1), unless the authorization is terminated  or revoked sooner.       Influenza A by PCR NEGATIVE NEGATIVE   Influenza B by PCR NEGATIVE NEGATIVE    Comment: (NOTE) The Xpert Xpress SARS-CoV-2/FLU/RSV plus assay is intended as an aid in the diagnosis of influenza from Nasopharyngeal swab specimens and should not be used as a sole basis for treatment. Nasal washings and aspirates are unacceptable for Xpert Xpress SARS-CoV-2/FLU/RSV testing.  Fact Sheet for Patients: EntrepreneurPulse.com.au  Fact Sheet for Healthcare Providers: IncredibleEmployment.be  This test is not yet approved or cleared by the [place] FDA and has been authorized for detection and/or diagnosis of SARS-CoV-2 by FDA under an Emergency Use Authorization (EUA). This EUA will remain in effect (meaning this test can be used) for the duration of the COVID-19 declaration under Section 564(b)(1) of the Act, 21 U.S.C. section 360bbb-3(b)(1), unless the authorization is terminated or revoked.  Performed at [facility], [address], [place],  [address]  CT ABDOMEN PELVIS W CONTRAST  Result Date: 02/02/2021 CLINICAL DATA:  70 year old female with history of persistent pain following cholecystectomy performed on Jan 04, 2021. Evaluate for intra-abdominal abscess. EXAM: CT ABDOMEN AND PELVIS WITH CONTRAST TECHNIQUE: Multidetector CT imaging of the abdomen and pelvis was performed using the standard protocol following bolus administration of intravenous contrast. CONTRAST:  174m OMNIPAQUE IOHEXOL 300 MG/ML  SOLN COMPARISON:  CT the abdomen and pelvis 08/23/2020. FINDINGS: Lower chest: Small amount of scarring or subsegmental atelectasis in the left lower lobe. Hepatobiliary: Liver has a slightly shrunken appearance and nodular contour, suggesting early changes of cirrhosis. No discrete cystic or solid hepatic lesions. Status post cholecystectomy. Very mild intra and extrahepatic biliary ductal dilatation with the common bile duct measuring 9 mm in the porta hepatis. Pancreas: In the body of the pancreas (axial image 27 of series 2 and coronal image 52 of series 5) there is a again a 1.2 x 0.7 x 1.2 cm low-attenuation (19 HU) lesion, favored to represent a benign cystic lesion. No other pancreatic mass. No pancreatic ductal dilatation. No peripancreatic fluid collections or inflammatory changes. Spleen: Unremarkable. Adrenals/Urinary Tract: Subcentimeter low-attenuation lesion in the anterior aspect of the lower pole of the left kidney, too small to characterize, but statistically likely to represent a tiny cyst. Right kidney and bilateral adrenal glands are normal in appearance. No hydroureteronephrosis. Urinary bladder is normal in appearance. Stomach/Bowel: Unenhanced appearance of the stomach is normal. There is no pathologic dilatation of small bowel or colon. Some nondilated loops of small bowel the with internal air-fluid levels are noted in the mid abdomen (example on axial image 51 of series 2),  nonspecific. Appendix is not confidently identified may be surgically absent. Vascular/Lymphatic: No significant atherosclerotic disease, aneurysm or dissection noted in the abdominal or pelvic vasculature. No lymphadenopathy noted in the abdomen or pelvis. Reproductive: Uterus and ovaries are unremarkable in appearance. Other: Small volume of ascites most evident adjacent to the liver and in the low anatomic pelvis. Notably, in the low anatomic pelvis there is diffuse enhancement of the peritoneal membranes suggesting peritonitis. No well-defined fluid collection is confidently identified to suggest intra-abdominal abscess. No pneumoperitoneum. Musculoskeletal: There are no aggressive appearing lytic or blastic lesions noted in the visualized portions of the skeleton. IMPRESSION: 1. Status post cholecystectomy. No unexpected postoperative fluid collection in the gallbladder fossa. However, there is a small volume of ascites and areas of peritoneal enhancement, as above, concerning for peritonitis. 2. Unusual nonobstructive bowel gas pattern, which may suggest a very mild ileus, as detailed above. 3. Morphologic changes in the liver suggestive of underlying cirrhosis. 4. Mild intra and extrahepatic biliary ductal dilatation, likely reflective of benign post cholecystectomy physiology. No calcified choledocholithiasis identified. 5. Additional incidental findings, as above. Electronically Signed   By: DVinnie LangtonM.D.   On: 02/02/2021 10:23   Anti-infectives (From admission, onward)   Start     Dose/Rate Route Frequency Ordered Stop   02/02/21 1800  piperacillin-tazobactam (ZOSYN) IVPB 3.375 g        3.375 g 12.5 mL/hr over 240 Minutes Intravenous Every 8 hours 02/02/21 1137     02/02/21 1200  piperacillin-tazobactam (ZOSYN) IVPB 3.375 g  Status:  Discontinued        3.375 g 12.5 mL/hr over 240 Minutes Intravenous Every 8 hours 02/02/21 1135 02/02/21 1137   02/02/21 1200  piperacillin-tazobactam  (ZOSYN) IVPB 3.375 g        3.375 g 100 mL/hr over 30 Minutes Intravenous  Once 02/02/21 1137 02/02/21 1242      Assessment/Plan Hx Laparoscopic Cholecystectomy with IOC on 01/04/21 by Dr. Georgette Dover. RUQ abdominal pain Hyperbilirubinemia  - CT A/P that showed small volume of ascites and areas of peritoneal enhancement but no unexpected postoperative fluid collection in the gallbladder fossa.  - WBC 6.9. Alk phos 919. T. Bili 1.6. AST/ALT and Lipase wnl.  - Will plan for HIDA to rule out bile leak  - Trend labs  FEN - NPO, IVF VTE - SCDs ID - Zosyn ordered in ED  Barkley Boards, [facility] 02/02/2021, 4:24 PM Please see Amion for pager number during day hours 7:00am-4:30pm

## 2021-02-02 NOTE — ED Provider Notes (Signed)
Susan Chang Provider Note   CSN: [id number] Arrival date & time: 02/02/21  0730     History Chief Complaint  Patient presents with  . Abdominal Pain    Susan Chang is a 70 y.o. female.  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  Reports she is had pain since the surgery.  She stopped taking the pain medication about 3 days after the surgery as she had constipation, and then took MiraLAX and started having diarrhea.  Reports having a normal bowel movement yesterday and denies any diarrhea or constipation symptoms today.  She obtained outpatient labs and they are concerned about the elevated alk phosphatase and bilirubin.  Her pain is severe on the right side.  Reports that she does have abdominal pain that worsens with eating as well.  Reports nausea but denies vomiting.  Denies fevers, urinary symptoms.  She and her husband report they are generally unhappy with her surgical experience and follow up.   Past Medical History:  Diagnosis Date  . Breast cancer ([place]) 2000   right  . Osteopenia   . Preventative health care 07/13/2016  . Vitamin D deficiency 11/14/2015    Patient Active Problem List   Diagnosis Date Noted  . Abdominal pain 01/28/2018  . Right hip pain 01/28/2018  . Migraine 01/10/2018  . Cervical cancer screening 07/20/2017  . Hyperlipidemia 07/20/2017  . Hx of colonic polyp 07/20/2017  . Preventative health care 07/13/2016  . Vitamin D deficiency 11/14/2015  . Breast cancer ([place])   . Osteopenia     Past Surgical History:  Procedure Laterality Date  . ABDOMINAL HYSTERECTOMY  2001   TAH b/l SPO for ovarian cyst,  . APPENDECTOMY    . BREAST BIOPSY Right 2000  . BREAST SURGERY   2000   cancer, right lumpectomy  . CHOLECYSTECTOMY N/A 01/04/2021   Procedure: LAPAROSCOPIC CHOLECYSTECTOMY WITH INTRAOPERATIVE CHOLANGIOGRAM;  Surgeon: Donnie Mesa, MD;  Location: [facility];  Service: General;  Laterality: N/A;  . LYMPH NODE DISSECTION Right 2000     OB History   No obstetric history on file.     Family History  Problem Relation Age of Onset  . Cancer Mother        Breast cancer  . Hypertension Mother   . Stroke Mother   . Cancer Father        lung cancer  . Cancer Paternal Aunt        breast cancer  . Cancer Brother     Social History   Tobacco Use  . Smoking status: Never Smoker  . Smokeless tobacco: Never Used  Vaping Use  . Vaping Use: Never used  Substance Use Topics  . Alcohol use: Not Currently    Alcohol/week: 0.0 standard drinks  . Drug use: No    Home Medications Prior to Admission medications   Medication Sig Start Date End Date Taking? Authorizing Provider  Vitamin D, Ergocalciferol, (DRISDOL) 1.25 MG (50000 UNIT) CAPS capsule TAKE 1 CAPSULE BY MOUTH EVERY 7 DAYS Patient taking differently: Take 50,000 Units by mouth every 7 (seven) days. 08/20/20   Mosie Lukes, MD    Allergies    Patient has no known allergies.  Review of Systems   Review of Systems  Constitutional:  Negative for fever.  HENT: Negative for sore throat.   Eyes: Negative for visual disturbance.  Respiratory: Negative for cough and shortness of breath.   Cardiovascular: Negative for chest pain.  Gastrointestinal: Positive for abdominal pain and nausea. Negative for constipation, diarrhea and vomiting.  Genitourinary: Negative for difficulty urinating and dysuria.  Musculoskeletal: Negative for back pain and neck pain.  Skin: Negative for rash.  Neurological: Negative for syncope and headaches.    Physical Exam Updated Vital Signs BP (!) 157/87 (BP Location: Left Wrist)   Pulse 70   Temp 98.6 F (37 C) (Oral)   Resp 18   Ht 5' 5"  (1.651 m)   Wt 64 kg    SpO2 100%   BMI 23.46 kg/m   Physical Exam Vitals and nursing note reviewed.  Constitutional:      General: She is not in acute distress.    Appearance: She is well-developed. She is not diaphoretic.  HENT:     Head: Normocephalic and atraumatic.  Eyes:     Conjunctiva/sclera: Conjunctivae normal.  Cardiovascular:     Rate and Rhythm: Normal rate and regular rhythm.     Heart sounds: Normal heart sounds.  Pulmonary:     Effort: Pulmonary effort is normal. No respiratory distress.     Breath sounds: Normal breath sounds.  Abdominal:     General: There is no distension.     Palpations: Abdomen is soft.     Tenderness: There is generalized abdominal tenderness and tenderness in the right upper quadrant. There is guarding. Positive signs include Murphy's sign.  Musculoskeletal:        General: No tenderness.     Cervical back: Normal range of motion.  Skin:    General: Skin is warm and dry.     Findings: No erythema or rash.  Neurological:     Mental Status: She is alert and oriented to person, place, and time.     ED Results / Procedures / Treatments   Labs (all labs ordered are listed, but only abnormal results are displayed) Labs Reviewed  CBC WITH DIFFERENTIAL/PLATELET - Abnormal; Notable for the following components:      Result Value   Hemoglobin 11.8 (*)    Platelets 423 (*)    All other components within normal limits  COMPREHENSIVE METABOLIC PANEL - Abnormal; Notable for the following components:   Glucose, Bld 105 (*)    Alkaline Phosphatase 919 (*)    Total Bilirubin 1.6 (*)    All other components within normal limits  URINALYSIS, ROUTINE W REFLEX MICROSCOPIC - Abnormal; Notable for the following components:   Hgb urine dipstick TRACE (*)    All other components within normal limits  URINALYSIS, MICROSCOPIC (REFLEX) - Abnormal; Notable for the following components:   Bacteria, UA RARE (*)    All other components within normal limits  RESP PANEL BY RT-PCR  (FLU A&B, COVID) ARPGX2  LIPASE, BLOOD    EKG EKG Interpretation  Date/Time:  Wednesday February 02 2021 08:43:26 EDT Ventricular Rate:  83 PR Interval:  180 QRS Duration: 77 QT Interval:  382 QTC Calculation: 449 R Axis:   70 Text Interpretation: Sinus rhythm No significant change since last tracing Confirmed by Gareth Morgan [phone]) on 02/02/2021 3:59:57 PM   Radiology CT ABDOMEN PELVIS W CONTRAST  Result Date: 02/02/2021 CLINICAL DATA:  70 year old female with history of persistent pain following cholecystectomy performed on Jan 04, 2021. Evaluate for intra-abdominal abscess. EXAM: CT ABDOMEN AND PELVIS WITH CONTRAST  TECHNIQUE: Multidetector CT imaging of the abdomen and pelvis was performed using the standard protocol following bolus administration of intravenous contrast. CONTRAST:  182m OMNIPAQUE IOHEXOL 300 MG/ML  SOLN COMPARISON:  CT the abdomen and pelvis 08/23/2020. FINDINGS: Lower chest: Small amount of scarring or subsegmental atelectasis in the left lower lobe. Hepatobiliary: Liver has a slightly shrunken appearance and nodular contour, suggesting early changes of cirrhosis. No discrete cystic or solid hepatic lesions. Status post cholecystectomy. Very mild intra and extrahepatic biliary ductal dilatation with the common bile duct measuring 9 mm in the porta hepatis. Pancreas: In the body of the pancreas (axial image 27 of series 2 and coronal image 52 of series 5) there is a again a 1.2 x 0.7 x 1.2 cm low-attenuation (19 HU) lesion, favored to represent a benign cystic lesion. No other pancreatic mass. No pancreatic ductal dilatation. No peripancreatic fluid collections or inflammatory changes. Spleen: Unremarkable. Adrenals/Urinary Tract: Subcentimeter low-attenuation lesion in the anterior aspect of the lower pole of the left kidney, too small to characterize, but statistically likely to represent a tiny cyst. Right kidney and bilateral adrenal glands are normal in appearance. No  hydroureteronephrosis. Urinary bladder is normal in appearance. Stomach/Bowel: Unenhanced appearance of the stomach is normal. There is no pathologic dilatation of small bowel or colon. Some nondilated loops of small bowel the with internal air-fluid levels are noted in the mid abdomen (example on axial image 51 of series 2), nonspecific. Appendix is not confidently identified may be surgically absent. Vascular/Lymphatic: No significant atherosclerotic disease, aneurysm or dissection noted in the abdominal or pelvic vasculature. No lymphadenopathy noted in the abdomen or pelvis. Reproductive: Uterus and ovaries are unremarkable in appearance. Other: Small volume of ascites most evident adjacent to the liver and in the low anatomic pelvis. Notably, in the low anatomic pelvis there is diffuse enhancement of the peritoneal membranes suggesting peritonitis. No well-defined fluid collection is confidently identified to suggest intra-abdominal abscess. No pneumoperitoneum. Musculoskeletal: There are no aggressive appearing lytic or blastic lesions noted in the visualized portions of the skeleton. IMPRESSION: 1. Status post cholecystectomy. No unexpected postoperative fluid collection in the gallbladder fossa. However, there is a small volume of ascites and areas of peritoneal enhancement, as above, concerning for peritonitis. 2. Unusual nonobstructive bowel gas pattern, which may suggest a very mild ileus, as detailed above. 3. Morphologic changes in the liver suggestive of underlying cirrhosis. 4. Mild intra and extrahepatic biliary ductal dilatation, likely reflective of benign post cholecystectomy physiology. No calcified choledocholithiasis identified. 5. Additional incidental findings, as above. Electronically Signed   By: DVinnie LangtonM.D.   On: 02/02/2021 10:23    Procedures Procedures   Medications Ordered in ED Medications  0.9 %  sodium chloride infusion ( Intravenous New Bag/Given 02/02/21 0832)   piperacillin-tazobactam (ZOSYN) IVPB 3.375 g (has no administration in time range)  iohexol (OMNIPAQUE) 300 MG/ML solution 100 mL (100 mLs Intravenous Contrast Given 02/02/21 0955)  sodium chloride 0.9 % bolus 1,000 mL ( Intravenous Stopped 02/02/21 1400)  ondansetron (ZOFRAN) injection 4 mg (4 mg Intravenous Given 02/02/21 1203)  fentaNYL (SUBLIMAZE) injection 50 mcg (50 mcg Intravenous Given 02/02/21 1203)  piperacillin-tazobactam (ZOSYN) IVPB 3.375 g (3.375 g Intravenous New Bag/Given 02/02/21 1212)    ED Course  I have reviewed the triage vital signs and the nursing notes.  Pertinent labs & imaging results that were available during my care of the patient were reviewed by me and considered in my medical decision making (see chart for details).  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  Differential diagnosis includes pyelonephritis, but, bile duct stone, pancreatitis, small bowel obstruction.  CT abdomen pelvis shows no unexpected postoperative fluid collection of the gallbladder fossa, however a small volume of ascites and areas of peritoneal enhancement concerning for peritonitis, liver morphologic changes suggestive of underlying cirrhosis, unusual nonobstructive bowel gas pattern which may represent mild ileus, mild intra and extrahepatic biliary dilation likely reflective of benign postcholecystectomy physiology without choledocholithiasis identified.   Labs do show mildly elevated bilirubin and elevated ALKPhosphatase.  Called general surgery and spoke with Dr. Marcello Moores.  Given patient had her surgery done at [facility], plan be to send her back to [facility] and she will contact Dr. Harlow Asa who is the  surgeon on-call today.  Initially discussed possible hospitalist admission, however surgery did put in a bed request to primarily admit after this discussion.  Ordered empiric zosyn, plan on admission for further evaluation, HIDA scan, r/o bile leak or choledocolithiasis.   Final Clinical Impression(s) / ED Diagnoses Final diagnoses:  Right upper quadrant abdominal pain  Peritonitis [facility])    Rx / DC Orders ED Discharge Orders    None       Gareth Morgan, MD 02/02/21 1616

## 2021-02-03 ENCOUNTER — Inpatient Hospital Stay (HOSPITAL_COMMUNITY): Payer: Medicare HMO

## 2021-02-03 LAB — CBC
HCT: 32.6 % — ABNORMAL LOW (ref 36.0–46.0)
Hemoglobin: 10.3 g/dL — ABNORMAL LOW (ref 12.0–15.0)
MCH: 29.4 pg (ref 26.0–34.0)
MCHC: 31.6 g/dL (ref 30.0–36.0)
MCV: 93.1 fL (ref 80.0–100.0)
Platelets: 359 10*3/uL (ref 150–400)
RBC: 3.5 MIL/uL — ABNORMAL LOW (ref 3.87–5.11)
RDW: 13.3 % (ref 11.5–15.5)
WBC: 6.1 10*3/uL (ref 4.0–10.5)
nRBC: 0 % (ref 0.0–0.2)

## 2021-02-03 LAB — COMPREHENSIVE METABOLIC PANEL
ALT: 19 U/L (ref 0–44)
AST: 22 U/L (ref 15–41)
Albumin: 3.1 g/dL — ABNORMAL LOW (ref 3.5–5.0)
Alkaline Phosphatase: 719 U/L — ABNORMAL HIGH (ref 38–126)
Anion gap: 7 (ref 5–15)
BUN: 7 mg/dL — ABNORMAL LOW (ref 8–23)
CO2: 26 mmol/L (ref 22–32)
Calcium: 8.8 mg/dL — ABNORMAL LOW (ref 8.9–10.3)
Chloride: 106 mmol/L (ref 98–111)
Creatinine, Ser: 0.9 mg/dL (ref 0.44–1.00)
GFR, Estimated: >60 mL/min (ref >60)
Glucose, Bld: 128 mg/dL — ABNORMAL HIGH (ref 70–99)
Potassium: 3.9 mmol/L (ref 3.5–5.1)
Sodium: 139 mmol/L (ref 135–145)
Total Bilirubin: 1.6 mg/dL — ABNORMAL HIGH (ref 0.3–1.2)
Total Protein: 6.5 g/dL (ref 6.5–8.1)

## 2021-02-03 MED ORDER — TECHNETIUM TC 99M MEBROFENIN IV KIT
5.0000 | PACK | Freq: Once | INTRAVENOUS | Status: AC | PRN
  Administered 2021-02-03: 5 via INTRAVENOUS

## 2021-02-03 NOTE — H&P (View-Only) (Signed)
[facility] Gastroenterology Consultation Note  Referring Provider: Dr. Georgette Dover Primary Care Physician:  Mosie Lukes, MD Primary Gastroenterologist:  Dr.  Clarene Essex  Reason for Consultation:  Bile leak  HPI: Susan Chang is a 70 y.o. female we've been asked to see for bile leak.  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  She describes having pain and diarrhea ever since surgery; pain is diverse and migratory.  Pain, especially in periumbilical, epigastric, and RUQ region, has been worse over the past few weeks.  CT showed some ascites and HIDA positive for bile leak.  Intraoperative cholangiogram showed no obvious choledocholithiasis or other abnormalities beyond mildly prominent extrahepatic bile duct.     Past Medical History:  Diagnosis Date  . Breast cancer ([place]) 2000   right  . Osteopenia   . Preventative health care 07/13/2016  . Vitamin D deficiency 11/14/2015    Past Surgical History:  Procedure Laterality Date  . ABDOMINAL HYSTERECTOMY  2001   TAH b/l SPO for ovarian cyst,  . APPENDECTOMY    . BREAST BIOPSY Right 2000  . BREAST SURGERY  2000   cancer, right lumpectomy  . CHOLECYSTECTOMY N/A 01/04/2021   Procedure: LAPAROSCOPIC CHOLECYSTECTOMY WITH INTRAOPERATIVE CHOLANGIOGRAM;  Surgeon: Donnie Mesa, MD;  Location: [place];  Service: General;  Laterality: N/A;  . LYMPH NODE DISSECTION Right 2000    Prior to Admission medications   Medication Sig Start Date End Date Taking? Authorizing Provider  Vitamin D, Ergocalciferol, (DRISDOL) 1.25 MG (50000 UNIT) CAPS capsule TAKE 1 CAPSULE BY MOUTH EVERY 7 DAYS Patient taking differently: Take 50,000 Units by mouth every 7 (seven) days. 08/20/20   Mosie Lukes, MD    Current Facility-Administered Medications  Medication Dose Route Frequency Provider Last Rate Last Admin  . 0.9 %  sodium chloride infusion   Intravenous PRN Gareth Morgan, MD   Stopped at 02/02/21 0934  .  acetaminophen (TYLENOL) tablet 650 mg  650 mg Oral Q6H PRN Simaan, Elizabeth S, PA-C      . diphenhydrAMINE (BENADRYL) 12.5 MG/5ML elixir 12.5 mg  12.5 mg Oral Q6H PRN Jill Alexanders, PA-C       Or  . diphenhydrAMINE (BENADRYL) injection 12.5 mg  12.5 mg Intravenous Q6H PRN Simaan, Elizabeth S, PA-C      . ketorolac (TORADOL) 15 MG/ML injection 15 mg  15 mg Intravenous Q6H PRN Norm Parcel, PA-C      . metoprolol tartrate (LOPRESSOR) injection 5 mg  5 mg Intravenous Q6H PRN Jill Alexanders, PA-C      . morphine 2 MG/ML injection 2 mg  2 mg Intravenous Q3H PRN Simaan, Elizabeth S, PA-C      . ondansetron (ZOFRAN-ODT) disintegrating tablet 4 mg  4 mg Oral Q6H PRN Jill Alexanders, PA-C       Or  . ondansetron (ZOFRAN) injection 4 mg  4 mg Intravenous Q6H PRN Jill Alexanders, PA-C        Allergies as of 02/02/2021  . (No Known Allergies)    Family History  Problem Relation Age of Onset  . Cancer Mother        Breast cancer  . Hypertension Mother   . Stroke Mother   . Cancer Father        lung cancer  . Cancer Paternal Aunt        breast cancer  . Cancer Brother     Social History   Socioeconomic History  .  Marital status: Married    Spouse name: Not on file  . Number of children: Not on file  . Years of education: Not on file  . Highest education level: Not on file  Occupational History  . Occupation: Production manager  Tobacco Use  . Smoking status: Never Smoker  . Smokeless tobacco: Never Used  Vaping Use  . Vaping Use: Never used  Substance and Sexual Activity  . Alcohol use: Not Currently    Alcohol/week: 0.0 standard drinks  . Drug use: No  . Sexual activity: Yes    Comment: lives with husband, drafting and design, no dietary restrictions  Other Topics Concern  . Not on file  Social History Narrative   No major dietary restrictions but eats very little meat   Stays very physically active. Walks daily   Works Acupuncturist   Social  Determinants of Radio broadcast assistant Strain: Not on file  Food Insecurity: Not on file  Transportation Needs: Not on file  Physical Activity: Not on file  Stress: Not on file  Social Connections: Not on file  Intimate Partner Violence: Not on file    Review of Systems: As per HPI, all others negative  Physical Exam: Vital signs in last 24 hours: Temp:  [98.3 F (36.8 C)-99.1 F (37.3 C)] 98.6 F (37 C) (06/02 1102) Pulse Rate:  [70-81] 81 (06/02 1102) Resp:  [16-18] 16 (06/02 1102) BP: (108-158)/(71-102) 131/73 (06/02 1102) SpO2:  [98 %-100 %] 100 % (06/02 1102) Last BM Date: 02/01/21 General:   Alert,  Well-developed, well-nourished, pleasant and cooperative in NAD Head:  Normocephalic and atraumatic. Eyes:  Sclera clear, no icterus.   Conjunctiva pink. Ears:  Normal auditory acuity. Nose:  No deformity, discharge,  or lesions. Mouth:  No deformity or lesions.  Oropharynx pink & moist. Neck:  Supple; no masses or thyromegaly. Lungs:  Clear throughout to auscultation.   No wheezes, crackles, or rhonchi. No acute distress. Heart:  Regular rate and rhythm; no murmurs, clicks, rubs,  or gallops. Abdomen:  Soft, non-distended, mild epigastric and RUQ tenderness without peritonitis No masses, hepatosplenomegaly or hernias noted. Normal bowel sounds, without guarding, and without rebound.     Msk:  Symmetrical without gross deformities. Normal posture. Pulses:  Normal pulses noted. Extremities:  Without clubbing or edema. Neurologic:  Alert and  oriented x4;  grossly normal neurologically. Skin:  Intact without significant lesions or rashes. Psych:  Alert and cooperative. Normal mood and affect.   Lab Results: Recent Labs    02/02/21 0815 02/03/21 0120  WBC 6.9 6.1  HGB 11.8* 10.3*  HCT 36.4 32.6*  PLT 423* 359   BMET Recent Labs    02/02/21 0815 02/03/21 0120  NA 137 139  K 3.6 3.9  CL 101 106  CO2 28 26  GLUCOSE 105* 128*  BUN 10 7*  CREATININE 0.79  0.90  CALCIUM 9.0 8.8*   LFT Recent Labs    02/03/21 0120  PROT 6.5  ALBUMIN 3.1*  AST 22  ALT 19  ALKPHOS 719*  BILITOT 1.6*   PT/INR No results for input(s): LABPROT, INR in the last 72 hours.  Studies/Results: CT ABDOMEN PELVIS W CONTRAST  Result Date: 02/02/2021 CLINICAL DATA:  70 year old female with history of persistent pain following cholecystectomy performed on Jan 04, 2021. Evaluate for intra-abdominal abscess. EXAM: CT ABDOMEN AND PELVIS WITH CONTRAST TECHNIQUE: Multidetector CT imaging of the abdomen and pelvis was performed using the standard protocol following bolus administration  of intravenous contrast. CONTRAST:  139mL OMNIPAQUE IOHEXOL 300 MG/ML  SOLN COMPARISON:  CT the abdomen and pelvis 08/23/2020. FINDINGS: Lower chest: Small amount of scarring or subsegmental atelectasis in the left lower lobe. Hepatobiliary: Liver has a slightly shrunken appearance and nodular contour, suggesting early changes of cirrhosis. No discrete cystic or solid hepatic lesions. Status post cholecystectomy. Very mild intra and extrahepatic biliary ductal dilatation with the common bile duct measuring 9 mm in the porta hepatis. Pancreas: In the body of the pancreas (axial image 27 of series 2 and coronal image 52 of series 5) there is a again a 1.2 x 0.7 x 1.2 cm low-attenuation (19 HU) lesion, favored to represent a benign cystic lesion. No other pancreatic mass. No pancreatic ductal dilatation. No peripancreatic fluid collections or inflammatory changes. Spleen: Unremarkable. Adrenals/Urinary Tract: Subcentimeter low-attenuation lesion in the anterior aspect of the lower pole of the left kidney, too small to characterize, but statistically likely to represent a tiny cyst. Right kidney and bilateral adrenal glands are normal in appearance. No hydroureteronephrosis. Urinary bladder is normal in appearance. Stomach/Bowel: Unenhanced appearance of the stomach is normal. There is no pathologic dilatation  of small bowel or colon. Some nondilated loops of small bowel the with internal air-fluid levels are noted in the mid abdomen (example on axial image 51 of series 2), nonspecific. Appendix is not confidently identified may be surgically absent. Vascular/Lymphatic: No significant atherosclerotic disease, aneurysm or dissection noted in the abdominal or pelvic vasculature. No lymphadenopathy noted in the abdomen or pelvis. Reproductive: Uterus and ovaries are unremarkable in appearance. Other: Small volume of ascites most evident adjacent to the liver and in the low anatomic pelvis. Notably, in the low anatomic pelvis there is diffuse enhancement of the peritoneal membranes suggesting peritonitis. No well-defined fluid collection is confidently identified to suggest intra-abdominal abscess. No pneumoperitoneum. Musculoskeletal: There are no aggressive appearing lytic or blastic lesions noted in the visualized portions of the skeleton. IMPRESSION: 1. Status post cholecystectomy. No unexpected postoperative fluid collection in the gallbladder fossa. However, there is a small volume of ascites and areas of peritoneal enhancement, as above, concerning for peritonitis. 2. Unusual nonobstructive bowel gas pattern, which may suggest a very mild ileus, as detailed above. 3. Morphologic changes in the liver suggestive of underlying cirrhosis. 4. Mild intra and extrahepatic biliary ductal dilatation, likely reflective of benign post cholecystectomy physiology. No calcified choledocholithiasis identified. 5. Additional incidental findings, as above. Electronically Signed   By: Vinnie Langton M.D.   On: 02/02/2021 10:23   NM HEPATOBILIARY LEAK (POST-SURGICAL)  Result Date: 02/03/2021 CLINICAL DATA:  Recurrent upper abdominal pain. Recent cholecystectomy. EXAM: NUCLEAR MEDICINE HEPATOBILIARY IMAGING TECHNIQUE: Sequential images of the abdomen were obtained out to 120 minutes following intravenous administration of  radiopharmaceutical. RADIOPHARMACEUTICALS:  7 mCi Tc-50m  Choletec IV COMPARISON:  None. FINDINGS: Prompt uptake and biliary excretion of activity by the liver is seen. No gallbladder activity is seen, consistent with prior cholecystectomy. Biliary activity probably passes into small bowel, consistent with patent common bile duct. There is gradual abnormal accumulation radiopharmaceutical activity within the porta hepatis, and perihepatic spaces along the right hepatic lobe liver dome. This is consistent with postop bile leak. IMPRESSION: Positive for postop bile leak. No evidence of biliary obstruction. Electronically Signed   By: Marlaine Hind M.D.   On: 02/03/2021 11:30    Impression:  1.  Cholecystectomy early May 2022. 2.  Post-operative abdominal pain, migratory in character and location, ever since her cholecystectomy, but worse  over the past 2-3 weeks. 3.  Abnormal imaging, CT with perihepatic fluid, HIDA positive for bile leak. 4.  Post-operative bile leak.  Plan:  1.  Diet today?  CCS to decide whether clear liquids are ok. 2.  ERCP planned for tomorrow. 3.  Risks (up to and including bleeding, infection, perforation, pancreatitis that can be complicated by infected necrosis and death), benefits (removal of stones, alleviating blockage, decreasing risk of cholangitis or choledocholithiasis-related pancreatitis), and alternatives (watchful waiting, percutaneous transhepatic cholangiography) of ERCP were explained to patient/family in detail and patient elects to proceed.   LOS: 1 day   Tavone Caesar M  02/03/2021, 12:37 PM  Cell [phone] If no answer or after 5 PM call [phone]

## 2021-02-03 NOTE — Progress Notes (Signed)
Pt has requested no resident, designated assistant, or student be present for her procedure on 02/04/21. Pt will not signed consent until MD can confirm that he will be the only one doing her procedure.

## 2021-02-03 NOTE — Progress Notes (Signed)
Progress Note     Subjective: Patient reports some intermittent R sided pain overnight. Reports headache as well. She denies nausea or vomiting. She had just returned from HIDA scan when I saw her this AM. She is very concerned with having access to medical records, we discussed that she can sign a form up on the floor and then should be able to request/access her records.   Objective: Vital signs in last 24 hours: Temp:  [98.2 F (36.8 C)-99.1 F (37.3 C)] 98.3 F (36.8 C) (06/02 0601) Pulse Rate:  [70-81] 81 (06/02 0601) Resp:  [16-18] 17 (06/02 0601) BP: (108-158)/(57-102) 133/75 (06/02 0601) SpO2:  [98 %-100 %] 99 % (06/02 0601) Last BM Date: 02/01/21  Intake/Output from previous day: 06/01 0701 - 06/02 0700 In: 1903.4 [I.V.:1050; IV Piggyback:853.4] Out: -  Intake/Output this shift: No intake/output data recorded.  PE: General: pleasant, WD/WN female who is sitting up in NAD HEENT: head is normocephalic, atraumatic.  Sclera are anicteric.   Heart: regular, rate, and rhythm.  Lungs: Respiratory effort nonlabored Abd: Soft, ND, NT. +BS, incisions c/d/i Skin: no jaundice Psych: A&Ox4 with an appropriate affect   Lab Results:  Recent Labs    02/02/21 0815 02/03/21 0120  WBC 6.9 6.1  HGB 11.8* 10.3*  HCT 36.4 32.6*  PLT 423* 359   BMET Recent Labs    02/02/21 0815 02/03/21 0120  NA 137 139  K 3.6 3.9  CL 101 106  CO2 28 26  GLUCOSE 105* 128*  BUN 10 7*  CREATININE 0.79 0.90  CALCIUM 9.0 8.8*   PT/INR No results for input(s): LABPROT, INR in the last 72 hours. CMP     Component Value Date/Time   NA 139 02/03/2021 0120   K 3.9 02/03/2021 0120   CL 106 02/03/2021 0120   CO2 26 02/03/2021 0120   GLUCOSE 128 (H) 02/03/2021 0120   BUN 7 (L) 02/03/2021 0120   CREATININE 0.90 02/03/2021 0120   CALCIUM 8.8 (L) 02/03/2021 0120   PROT 6.5 02/03/2021 0120   ALBUMIN 3.1 (L) 02/03/2021 0120   AST 22 02/03/2021 0120   ALT 19 02/03/2021 0120   ALKPHOS  719 (H) 02/03/2021 0120   BILITOT 1.6 (H) 02/03/2021 0120   GFRNONAA >60 02/03/2021 0120   Lipase     Component Value Date/Time   LIPASE 31 02/02/2021 0815       Studies/Results: CT ABDOMEN PELVIS W CONTRAST  Result Date: 02/02/2021 CLINICAL DATA:  70 year old female with history of persistent pain following cholecystectomy performed on Jan 04, 2021. Evaluate for intra-abdominal abscess. EXAM: CT ABDOMEN AND PELVIS WITH CONTRAST TECHNIQUE: Multidetector CT imaging of the abdomen and pelvis was performed using the standard protocol following bolus administration of intravenous contrast. CONTRAST:  12m OMNIPAQUE IOHEXOL 300 MG/ML  SOLN COMPARISON:  CT the abdomen and pelvis 08/23/2020. FINDINGS: Lower chest: Small amount of scarring or subsegmental atelectasis in the left lower lobe. Hepatobiliary: Liver has a slightly shrunken appearance and nodular contour, suggesting early changes of cirrhosis. No discrete cystic or solid hepatic lesions. Status post cholecystectomy. Very mild intra and extrahepatic biliary ductal dilatation with the common bile duct measuring 9 mm in the porta hepatis. Pancreas: In the body of the pancreas (axial image 27 of series 2 and coronal image 52 of series 5) there is a again a 1.2 x 0.7 x 1.2 cm low-attenuation (19 HU) lesion, favored to represent a benign cystic lesion. No other pancreatic mass. No pancreatic ductal dilatation. No  peripancreatic fluid collections or inflammatory changes. Spleen: Unremarkable. Adrenals/Urinary Tract: Subcentimeter low-attenuation lesion in the anterior aspect of the lower pole of the left kidney, too small to characterize, but statistically likely to represent a tiny cyst. Right kidney and bilateral adrenal glands are normal in appearance. No hydroureteronephrosis. Urinary bladder is normal in appearance. Stomach/Bowel: Unenhanced appearance of the stomach is normal. There is no pathologic dilatation of small bowel or colon. Some  nondilated loops of small bowel the with internal air-fluid levels are noted in the mid abdomen (example on axial image 51 of series 2), nonspecific. Appendix is not confidently identified may be surgically absent. Vascular/Lymphatic: No significant atherosclerotic disease, aneurysm or dissection noted in the abdominal or pelvic vasculature. No lymphadenopathy noted in the abdomen or pelvis. Reproductive: Uterus and ovaries are unremarkable in appearance. Other: Small volume of ascites most evident adjacent to the liver and in the low anatomic pelvis. Notably, in the low anatomic pelvis there is diffuse enhancement of the peritoneal membranes suggesting peritonitis. No well-defined fluid collection is confidently identified to suggest intra-abdominal abscess. No pneumoperitoneum. Musculoskeletal: There are no aggressive appearing lytic or blastic lesions noted in the visualized portions of the skeleton. IMPRESSION: 1. Status post cholecystectomy. No unexpected postoperative fluid collection in the gallbladder fossa. However, there is a small volume of ascites and areas of peritoneal enhancement, as above, concerning for peritonitis. 2. Unusual nonobstructive bowel gas pattern, which may suggest a very mild ileus, as detailed above. 3. Morphologic changes in the liver suggestive of underlying cirrhosis. 4. Mild intra and extrahepatic biliary ductal dilatation, likely reflective of benign post cholecystectomy physiology. No calcified choledocholithiasis identified. 5. Additional incidental findings, as above. Electronically Signed   By: Vinnie Langton M.D.   On: 02/02/2021 10:23    Anti-infectives: Anti-infectives (From admission, onward)   Start     Dose/Rate Route Frequency Ordered Stop   02/02/21 1800  piperacillin-tazobactam (ZOSYN) IVPB 3.375 g        3.375 g 12.5 mL/hr over 240 Minutes Intravenous Every 8 hours 02/02/21 1137     02/02/21 1200  piperacillin-tazobactam (ZOSYN) IVPB 3.375 g  Status:   Discontinued        3.375 g 12.5 mL/hr over 240 Minutes Intravenous Every 8 hours 02/02/21 1135 02/02/21 1137   02/02/21 1200  piperacillin-tazobactam (ZOSYN) IVPB 3.375 g        3.375 g 100 mL/hr over 30 Minutes Intravenous  Once 02/02/21 1137 02/02/21 1242       Assessment/Plan Hx Laparoscopic Cholecystectomy with IOC on 01/04/21 by Dr. Georgette Dover. RUQ abdominal pain Hyperbilirubinemia  - CT A/P that showed small volume of ascites and areas of peritoneal enhancement but no unexpected postoperative fluid collection in the gallbladder fossa.  - WBC 6.1. Alk phos 719 today. T. Bili 1.6 today, stable. AST/ALT and Lipase wnl.  - HIDA done this AM and positive for postoperative bile leak - will consult GI for ERCP   FEN - CLD, IVF VTE - SCDs ID - Zosyn 6/1>6/2, will dc since no infectious process identified   LOS: 1 day    Norm Parcel, [facility] 02/03/2021, 10:45 AM Please see Amion for pager number during day hours 7:00am-4:30pm

## 2021-02-03 NOTE — Consult Note (Signed)
[facility] Gastroenterology Consultation Note  Referring Provider: Dr. Georgette Dover Primary Care Physician:  Mosie Lukes, MD Primary Gastroenterologist:  Dr.  Clarene Essex  Reason for Consultation:  Bile leak  HPI: Susan Chang is a 70 y.o. female we've been asked to see for bile leak.  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  She describes having pain and diarrhea ever since surgery; pain is diverse and migratory.  Pain, especially in periumbilical, epigastric, and RUQ region, has been worse over the past few weeks.  CT showed some ascites and HIDA positive for bile leak.  Intraoperative cholangiogram showed no obvious choledocholithiasis or other abnormalities beyond mildly prominent extrahepatic bile duct.     Past Medical History:  Diagnosis Date  . Breast cancer ([place]) 2000   right  . Osteopenia   . Preventative health care 07/13/2016  . Vitamin D deficiency 11/14/2015    Past Surgical History:  Procedure Laterality Date  . ABDOMINAL HYSTERECTOMY  2001   TAH b/l SPO for ovarian cyst,  . APPENDECTOMY    . BREAST BIOPSY Right 2000  . BREAST SURGERY  2000   cancer, right lumpectomy  . CHOLECYSTECTOMY N/A 01/04/2021   Procedure: LAPAROSCOPIC CHOLECYSTECTOMY WITH INTRAOPERATIVE CHOLANGIOGRAM;  Surgeon: Donnie Mesa, MD;  Location: [place];  Service: General;  Laterality: N/A;  . LYMPH NODE DISSECTION Right 2000    Prior to Admission medications   Medication Sig Start Date End Date Taking? Authorizing Provider  Vitamin D, Ergocalciferol, (DRISDOL) 1.25 MG (50000 UNIT) CAPS capsule TAKE 1 CAPSULE BY MOUTH EVERY 7 DAYS Patient taking differently: Take 50,000 Units by mouth every 7 (seven) days. 08/20/20   Mosie Lukes, MD    Current Facility-Administered Medications  Medication Dose Route Frequency Provider Last Rate Last Admin  . 0.9 %  sodium chloride infusion   Intravenous PRN Gareth Morgan, MD   Stopped at 02/02/21 0934  .  acetaminophen (TYLENOL) tablet 650 mg  650 mg Oral Q6H PRN Simaan, Elizabeth S, PA-C      . diphenhydrAMINE (BENADRYL) 12.5 MG/5ML elixir 12.5 mg  12.5 mg Oral Q6H PRN Jill Alexanders, PA-C       Or  . diphenhydrAMINE (BENADRYL) injection 12.5 mg  12.5 mg Intravenous Q6H PRN Simaan, Elizabeth S, PA-C      . ketorolac (TORADOL) 15 MG/ML injection 15 mg  15 mg Intravenous Q6H PRN Norm Parcel, PA-C      . metoprolol tartrate (LOPRESSOR) injection 5 mg  5 mg Intravenous Q6H PRN Jill Alexanders, PA-C      . morphine 2 MG/ML injection 2 mg  2 mg Intravenous Q3H PRN Simaan, Elizabeth S, PA-C      . ondansetron (ZOFRAN-ODT) disintegrating tablet 4 mg  4 mg Oral Q6H PRN Jill Alexanders, PA-C       Or  . ondansetron (ZOFRAN) injection 4 mg  4 mg Intravenous Q6H PRN Jill Alexanders, PA-C        Allergies as of 02/02/2021  . (No Known Allergies)    Family History  Problem Relation Age of Onset  . Cancer Mother        Breast cancer  . Hypertension Mother   . Stroke Mother   . Cancer Father        lung cancer  . Cancer Paternal Aunt        breast cancer  . Cancer Brother     Social History   Socioeconomic History  .  Marital status: Married    Spouse name: Not on file  . Number of children: Not on file  . Years of education: Not on file  . Highest education level: Not on file  Occupational History  . Occupation: Production manager  Tobacco Use  . Smoking status: Never Smoker  . Smokeless tobacco: Never Used  Vaping Use  . Vaping Use: Never used  Substance and Sexual Activity  . Alcohol use: Not Currently    Alcohol/week: 0.0 standard drinks  . Drug use: No  . Sexual activity: Yes    Comment: lives with husband, drafting and design, no dietary restrictions  Other Topics Concern  . Not on file  Social History Narrative   No major dietary restrictions but eats very little meat   Stays very physically active. Walks daily   Works Acupuncturist   Social  Determinants of Radio broadcast assistant Strain: Not on file  Food Insecurity: Not on file  Transportation Needs: Not on file  Physical Activity: Not on file  Stress: Not on file  Social Connections: Not on file  Intimate Partner Violence: Not on file    Review of Systems: As per HPI, all others negative  Physical Exam: Vital signs in last 24 hours: Temp:  [98.3 F (36.8 C)-99.1 F (37.3 C)] 98.6 F (37 C) (06/02 1102) Pulse Rate:  [70-81] 81 (06/02 1102) Resp:  [16-18] 16 (06/02 1102) BP: (108-158)/(71-102) 131/73 (06/02 1102) SpO2:  [98 %-100 %] 100 % (06/02 1102) Last BM Date: 02/01/21 General:   Alert,  Well-developed, well-nourished, pleasant and cooperative in NAD Head:  Normocephalic and atraumatic. Eyes:  Sclera clear, no icterus.   Conjunctiva pink. Ears:  Normal auditory acuity. Nose:  No deformity, discharge,  or lesions. Mouth:  No deformity or lesions.  Oropharynx pink & moist. Neck:  Supple; no masses or thyromegaly. Lungs:  Clear throughout to auscultation.   No wheezes, crackles, or rhonchi. No acute distress. Heart:  Regular rate and rhythm; no murmurs, clicks, rubs,  or gallops. Abdomen:  Soft, non-distended, mild epigastric and RUQ tenderness without peritonitis No masses, hepatosplenomegaly or hernias noted. Normal bowel sounds, without guarding, and without rebound.     Msk:  Symmetrical without gross deformities. Normal posture. Pulses:  Normal pulses noted. Extremities:  Without clubbing or edema. Neurologic:  Alert and  oriented x4;  grossly normal neurologically. Skin:  Intact without significant lesions or rashes. Psych:  Alert and cooperative. Normal mood and affect.   Lab Results: Recent Labs    02/02/21 0815 02/03/21 0120  WBC 6.9 6.1  HGB 11.8* 10.3*  HCT 36.4 32.6*  PLT 423* 359   BMET Recent Labs    02/02/21 0815 02/03/21 0120  NA 137 139  K 3.6 3.9  CL 101 106  CO2 28 26  GLUCOSE 105* 128*  BUN 10 7*  CREATININE 0.79  0.90  CALCIUM 9.0 8.8*   LFT Recent Labs    02/03/21 0120  PROT 6.5  ALBUMIN 3.1*  AST 22  ALT 19  ALKPHOS 719*  BILITOT 1.6*   PT/INR No results for input(s): LABPROT, INR in the last 72 hours.  Studies/Results: CT ABDOMEN PELVIS W CONTRAST  Result Date: 02/02/2021 CLINICAL DATA:  70 year old female with history of persistent pain following cholecystectomy performed on Jan 04, 2021. Evaluate for intra-abdominal abscess. EXAM: CT ABDOMEN AND PELVIS WITH CONTRAST TECHNIQUE: Multidetector CT imaging of the abdomen and pelvis was performed using the standard protocol following bolus administration  of intravenous contrast. CONTRAST:  183mL OMNIPAQUE IOHEXOL 300 MG/ML  SOLN COMPARISON:  CT the abdomen and pelvis 08/23/2020. FINDINGS: Lower chest: Small amount of scarring or subsegmental atelectasis in the left lower lobe. Hepatobiliary: Liver has a slightly shrunken appearance and nodular contour, suggesting early changes of cirrhosis. No discrete cystic or solid hepatic lesions. Status post cholecystectomy. Very mild intra and extrahepatic biliary ductal dilatation with the common bile duct measuring 9 mm in the porta hepatis. Pancreas: In the body of the pancreas (axial image 27 of series 2 and coronal image 52 of series 5) there is a again a 1.2 x 0.7 x 1.2 cm low-attenuation (19 HU) lesion, favored to represent a benign cystic lesion. No other pancreatic mass. No pancreatic ductal dilatation. No peripancreatic fluid collections or inflammatory changes. Spleen: Unremarkable. Adrenals/Urinary Tract: Subcentimeter low-attenuation lesion in the anterior aspect of the lower pole of the left kidney, too small to characterize, but statistically likely to represent a tiny cyst. Right kidney and bilateral adrenal glands are normal in appearance. No hydroureteronephrosis. Urinary bladder is normal in appearance. Stomach/Bowel: Unenhanced appearance of the stomach is normal. There is no pathologic dilatation  of small bowel or colon. Some nondilated loops of small bowel the with internal air-fluid levels are noted in the mid abdomen (example on axial image 51 of series 2), nonspecific. Appendix is not confidently identified may be surgically absent. Vascular/Lymphatic: No significant atherosclerotic disease, aneurysm or dissection noted in the abdominal or pelvic vasculature. No lymphadenopathy noted in the abdomen or pelvis. Reproductive: Uterus and ovaries are unremarkable in appearance. Other: Small volume of ascites most evident adjacent to the liver and in the low anatomic pelvis. Notably, in the low anatomic pelvis there is diffuse enhancement of the peritoneal membranes suggesting peritonitis. No well-defined fluid collection is confidently identified to suggest intra-abdominal abscess. No pneumoperitoneum. Musculoskeletal: There are no aggressive appearing lytic or blastic lesions noted in the visualized portions of the skeleton. IMPRESSION: 1. Status post cholecystectomy. No unexpected postoperative fluid collection in the gallbladder fossa. However, there is a small volume of ascites and areas of peritoneal enhancement, as above, concerning for peritonitis. 2. Unusual nonobstructive bowel gas pattern, which may suggest a very mild ileus, as detailed above. 3. Morphologic changes in the liver suggestive of underlying cirrhosis. 4. Mild intra and extrahepatic biliary ductal dilatation, likely reflective of benign post cholecystectomy physiology. No calcified choledocholithiasis identified. 5. Additional incidental findings, as above. Electronically Signed   By: Vinnie Langton M.D.   On: 02/02/2021 10:23   NM HEPATOBILIARY LEAK (POST-SURGICAL)  Result Date: 02/03/2021 CLINICAL DATA:  Recurrent upper abdominal pain. Recent cholecystectomy. EXAM: NUCLEAR MEDICINE HEPATOBILIARY IMAGING TECHNIQUE: Sequential images of the abdomen were obtained out to 120 minutes following intravenous administration of  radiopharmaceutical. RADIOPHARMACEUTICALS:  7 mCi Tc-58m  Choletec IV COMPARISON:  None. FINDINGS: Prompt uptake and biliary excretion of activity by the liver is seen. No gallbladder activity is seen, consistent with prior cholecystectomy. Biliary activity probably passes into small bowel, consistent with patent common bile duct. There is gradual abnormal accumulation radiopharmaceutical activity within the porta hepatis, and perihepatic spaces along the right hepatic lobe liver dome. This is consistent with postop bile leak. IMPRESSION: Positive for postop bile leak. No evidence of biliary obstruction. Electronically Signed   By: Marlaine Hind M.D.   On: 02/03/2021 11:30    Impression:  1.  Cholecystectomy early May 2022. 2.  Post-operative abdominal pain, migratory in character and location, ever since her cholecystectomy, but worse  over the past 2-3 weeks. 3.  Abnormal imaging, CT with perihepatic fluid, HIDA positive for bile leak. 4.  Post-operative bile leak.  Plan:  1.  Diet today?  CCS to decide whether clear liquids are ok. 2.  ERCP planned for tomorrow. 3.  Risks (up to and including bleeding, infection, perforation, pancreatitis that can be complicated by infected necrosis and death), benefits (removal of stones, alleviating blockage, decreasing risk of cholangitis or choledocholithiasis-related pancreatitis), and alternatives (watchful waiting, percutaneous transhepatic cholangiography) of ERCP were explained to patient/family in detail and patient elects to proceed.   LOS: 1 day   Tranisha Tissue M  02/03/2021, 12:37 PM  Cell [phone] If no answer or after 5 PM call [phone]

## 2021-02-03 NOTE — Plan of Care (Signed)
  Problem: Education: Goal: Knowledge of General Education information will improve Description: Including pain rating scale, medication(s)/side effects and non-pharmacologic comfort measures Outcome: Progressing   Problem: Activity: Goal: Risk for activity intolerance will decrease Outcome: Progressing   Problem: Nutrition: Goal: Adequate nutrition will be maintained Outcome: Progressing   Problem: Coping: Goal: Level of anxiety will decrease Outcome: Progressing   

## 2021-02-04 ENCOUNTER — Inpatient Hospital Stay (HOSPITAL_COMMUNITY): Payer: Medicare HMO | Admitting: Certified Registered"

## 2021-02-04 ENCOUNTER — Encounter (HOSPITAL_COMMUNITY): Payer: Self-pay

## 2021-02-04 ENCOUNTER — Encounter (HOSPITAL_COMMUNITY): Admission: EM | Disposition: A | Discharge status: Home / Self Care | Payer: Self-pay | Source: Home / Self Care

## 2021-02-04 ENCOUNTER — Inpatient Hospital Stay (HOSPITAL_COMMUNITY): Payer: Medicare HMO

## 2021-02-04 HISTORY — PX: BILIARY STENT PLACEMENT: SHX5538

## 2021-02-04 HISTORY — PX: SPHINCTEROTOMY: SHX5544

## 2021-02-04 HISTORY — PX: PANCREATIC STENT PLACEMENT: SHX5539

## 2021-02-04 HISTORY — PX: ERCP: SHX5425

## 2021-02-04 LAB — COMPREHENSIVE METABOLIC PANEL
ALT: 19 U/L (ref 0–44)
AST: 21 U/L (ref 15–41)
Albumin: 3.3 g/dL — ABNORMAL LOW (ref 3.5–5.0)
Alkaline Phosphatase: 790 U/L — ABNORMAL HIGH (ref 38–126)
Anion gap: 8 (ref 5–15)
BUN: 6 mg/dL — ABNORMAL LOW (ref 8–23)
CO2: 25 mmol/L (ref 22–32)
Calcium: 9.1 mg/dL (ref 8.9–10.3)
Chloride: 105 mmol/L (ref 98–111)
Creatinine, Ser: 0.77 mg/dL (ref 0.44–1.00)
GFR, Estimated: >60 mL/min (ref >60)
Glucose, Bld: 100 mg/dL — ABNORMAL HIGH (ref 70–99)
Potassium: 3.4 mmol/L — ABNORMAL LOW (ref 3.5–5.1)
Sodium: 138 mmol/L (ref 135–145)
Total Bilirubin: 2.1 mg/dL — ABNORMAL HIGH (ref 0.3–1.2)
Total Protein: 7.1 g/dL (ref 6.5–8.1)

## 2021-02-04 LAB — CBC
HCT: 34.5 % — ABNORMAL LOW (ref 36.0–46.0)
Hemoglobin: 11.1 g/dL — ABNORMAL LOW (ref 12.0–15.0)
MCH: 29.6 pg (ref 26.0–34.0)
MCHC: 32.2 g/dL (ref 30.0–36.0)
MCV: 92 fL (ref 80.0–100.0)
Platelets: 381 10*3/uL (ref 150–400)
RBC: 3.75 MIL/uL — ABNORMAL LOW (ref 3.87–5.11)
RDW: 13.1 % (ref 11.5–15.5)
WBC: 6.8 10*3/uL (ref 4.0–10.5)
nRBC: 0 % (ref 0.0–0.2)

## 2021-02-04 SURGERY — ERCP, WITH INTERVENTION IF INDICATED
Anesthesia: General

## 2021-02-04 MED ORDER — LACTATED RINGERS IV SOLN: INTRAVENOUS | Status: DC

## 2021-02-04 MED ORDER — LIDOCAINE 2% (20 MG/ML) 5 ML SYRINGE
INTRAMUSCULAR | Status: DC | PRN
  Administered 2021-02-04: 60 mg via INTRAVENOUS

## 2021-02-04 MED ORDER — PHENYLEPHRINE 40 MCG/ML (10ML) SYRINGE FOR IV PUSH (FOR BLOOD PRESSURE SUPPORT)
PREFILLED_SYRINGE | INTRAVENOUS | Status: DC | PRN
  Administered 2021-02-04: 40 ug via INTRAVENOUS
  Administered 2021-02-04 (×2): 80 ug via INTRAVENOUS

## 2021-02-04 MED ORDER — INDOMETHACIN 50 MG RE SUPP
RECTAL | Status: DC | PRN
  Administered 2021-02-04: 100 mg via RECTAL

## 2021-02-04 MED ORDER — FENTANYL CITRATE (PF) 100 MCG/2ML IJ SOLN
INTRAMUSCULAR | Status: AC
  Filled 2021-02-04: qty 2

## 2021-02-04 MED ORDER — SODIUM CHLORIDE 0.9 % IV SOLN
INTRAVENOUS | Status: DC | PRN
  Administered 2021-02-04: 10 mL

## 2021-02-04 MED ORDER — SUCCINYLCHOLINE CHLORIDE 200 MG/10ML IV SOSY
PREFILLED_SYRINGE | INTRAVENOUS | Status: DC | PRN
  Administered 2021-02-04: 80 mg via INTRAVENOUS

## 2021-02-04 MED ORDER — PROPOFOL 10 MG/ML IV BOLUS
INTRAVENOUS | Status: DC | PRN
  Administered 2021-02-04: 180 mg via INTRAVENOUS

## 2021-02-04 MED ORDER — DEXAMETHASONE SODIUM PHOSPHATE 4 MG/ML IJ SOLN
INTRAMUSCULAR | Status: DC | PRN
  Administered 2021-02-04: 10 mg via INTRAVENOUS

## 2021-02-04 MED ORDER — INDOMETHACIN 50 MG RE SUPP
RECTAL | Status: AC
  Filled 2021-02-04: qty 2

## 2021-02-04 MED ORDER — GLUCAGON HCL RDNA (DIAGNOSTIC) 1 MG IJ SOLR
INTRAMUSCULAR | Status: AC
  Filled 2021-02-04: qty 1

## 2021-02-04 MED ORDER — ONDANSETRON HCL 4 MG/2ML IJ SOLN
INTRAMUSCULAR | Status: DC | PRN
  Administered 2021-02-04: 4 mg via INTRAVENOUS

## 2021-02-04 MED ORDER — CIPROFLOXACIN IN D5W 400 MG/200ML IV SOLN
INTRAVENOUS | Status: AC
  Filled 2021-02-04: qty 200

## 2021-02-04 MED ORDER — CIPROFLOXACIN IN D5W 400 MG/200ML IV SOLN
INTRAVENOUS | Status: DC | PRN
  Administered 2021-02-04: 400 mg via INTRAVENOUS

## 2021-02-04 MED ORDER — GLUCAGON HCL RDNA (DIAGNOSTIC) 1 MG IJ SOLR
INTRAMUSCULAR | Status: DC | PRN
  Administered 2021-02-04: .5 mg via INTRAVENOUS

## 2021-02-04 NOTE — Op Note (Signed)
[facility] Patient Name: Susan Chang Procedure Date : 02/04/2021 MRN: [id number] Attending MD: Arta Silence , MD Date of Birth: 11-17-1950 CSN: [id number] Age: 70 Admit Type: Inpatient Procedure:                ERCP Indications:              Abdominal pain of suspected biliary origin,                            Abnormal hepatobiliary scintigraphy, Bile leak,                            Elevated liver enzymes Providers:                Arta Silence, MD, Elmer Ramp. Tilden Dome, RN, Benetta Spar, Technician Referring MD:             [facility] Medicines:                General Anesthesia, Glucagon 0.5 mg IV,                            Indomethacin 100 mg PR, Cipro 102 mg IV Complications:            No immediate complications. Estimated Blood Loss:     Estimated blood loss: none. Procedure:                Pre-Anesthesia Assessment:                           - Prior to the procedure, a History and Physical                            was performed, and patient medications and                            allergies were reviewed. The patient's tolerance of                            previous anesthesia was also reviewed. The risks                            and benefits of the procedure and the sedation                            options and risks were discussed with the patient.                            All questions were answered, and informed consent                            was obtained. Prior Anticoagulants: The patient has  taken no previous anticoagulant or antiplatelet                            agents. ASA Grade Assessment: II - A patient with                            mild systemic disease. After reviewing the risks                            and benefits, the patient was deemed in                            satisfactory condition to undergo the procedure.                           After  obtaining informed consent, the scope was                            passed under direct vision. Throughout the                            procedure, the patient's blood pressure, pulse, and                            oxygen saturations were monitored continuously. The                            TJF-Q180V ([id number]) Olympus duodenoscope was                            introduced through the mouth, and used to inject                            contrast into and used to inject contrast into the                            bile duct. The ERCP was accomplished without                            difficulty. The patient tolerated the procedure                            well. Scope In: Scope Out: Findings:      A scout film of the abdomen was obtained. Surgical clips, consistent       with a previous cholecystectomy, were seen in the area of the right       upper quadrant of the abdomen. The esophagus was successfully intubated       under direct vision. The scope was advanced to a normal major papilla in       the descending duodenum without detailed examination of the pharynx,       larynx and associated structures, and upper GI tract. The upper GI tract       was grossly normal. Preferential cannulation with wire of ventral  pancreatic duct. As a result, one 5 Fr by 5 cm plastic stent with a full       external pigtail and a single internal flap was placed 5 cm into the       ventral pancreatic duct. Clear fluid flowed through the stent. The stent       was in good position. A 5 mm biliary sphincterotomy was made with a       needle knife over a pancreatic stent using blended current. There was no       post-sphincterotomy bleeding. The bile duct was subsequently deeply       cannulated. Contrast was injected. I personally interpreted the bile       duct images. Ductal flow of contrast was adequate. The main bile duct       was mildly dilated. The largest diameter was 8 mm. No obvious  cystic       duct or intrahepatic bile leak was seen. No stricture or filling defects       were seen. Nevertheless, in light of CT and HIDA scan findings, one 8.5       Fr by 7 cm plastic stent with a single external flap and a single       internal flap was placed 7 cm into the common bile duct. Bile flowed       through the stent. The stent was in good position. Pancreatogram was not       obtained (intentionally). Impression:               - Bile leak by HIDA with intraabdominal free fluid                            (but no rampant bile leak on today's cholangiogram).                           - One plastic stent was placed into the ventral                            pancreatic duct.                           - A biliary sphincterotomy was performed.                           - The entire main bile duct was mildly dilated.                           - One plastic stent was placed into the common bile                            duct. Moderate Sedation:      None Recommendation:           - Avoid aspirin and nonsteroidal anti-inflammatory                            medicines for 3 days.                           - Return patient to hospital ward for  ongoing care.                           - Watch for pancreatitis, bleeding, perforation,                            and cholangitis.                           - Clear liquid diet today.                           - Observe patient's clinical course following                            today's ERCP with therapeutic intervention.                           - [facility] will follow. Procedure Code(s):        --- Professional ---                           [id number], Endoscopic retrograde                            cholangiopancreatography (ERCP); with placement of                            endoscopic stent into biliary or pancreatic duct,                            including pre- and post-dilation and guide wire                            passage,  when performed, including sphincterotomy,                            when performed, each stent                           22025, 34, Endoscopic retrograde                            cholangiopancreatography (ERCP); with placement of                            endoscopic stent into biliary or pancreatic duct,                            including pre- and post-dilation and guide wire                            passage, when performed, including sphincterotomy,                            when performed, each stent Diagnosis Code(s):        --- Professional ---  K83.8, Other specified diseases of biliary tract                           R10.9, Unspecified abdominal pain                           R94.5, Abnormal results of liver function studies                           R74.8, Abnormal levels of other serum enzymes CPT copyright 2019 [employer] Association. All rights reserved. The codes documented in this report are preliminary and upon coder review may  be revised to meet current compliance requirements. Arta Silence, MD 02/04/2021 12:36:27 PM This report has been signed electronically. Number of Addenda: 0

## 2021-02-04 NOTE — Transfer of Care (Signed)
Immediate Anesthesia Transfer of Care Note  Patient: Susan Chang  Procedure(s) Performed: ENDOSCOPIC RETROGRADE CHOLANGIOPANCREATOGRAPHY (ERCP) (N/A ) PANCREATIC STENT PLACEMENT SPHINCTEROTOMY BILIARY STENT PLACEMENT  Patient Location: PACU  Anesthesia Type:General  Level of Consciousness: drowsy  Airway & Oxygen Therapy: Patient Spontanous Breathing  Post-op Assessment: Report given to RN and Post -op Vital signs reviewed and stable  Post vital signs: stable  Last Vitals:  Vitals Value Taken Time  BP 163/69 02/04/21 1231  Temp    Pulse 77 02/04/21 1234  Resp 12 02/04/21 1234  SpO2 100 % 02/04/21 1234  Vitals shown include unvalidated device data.  Last Pain:  Vitals:   02/04/21 1032  TempSrc: Oral  PainSc: 0-No pain         Complications: No complications documented.

## 2021-02-04 NOTE — Progress Notes (Signed)
Progress Note  Day of Surgery  Subjective: Patient reports intermittent pain overnight. Denies nausea.   Objective: Vital signs in last 24 hours: Temp:  [98.5 F (36.9 C)-98.8 F (37.1 C)] 98.8 F (37.1 C) (06/03 1032) Pulse Rate:  [75-94] 88 (06/03 1032) Resp:  [16-18] 18 (06/03 1032) BP: (125-133)/(66-78) 133/66 (06/03 1032) SpO2:  [97 %-100 %] 99 % (06/03 1032) Weight:  [64 kg] 64 kg (06/03 1032) Last BM Date:  (02/03/2021)  Intake/Output from previous day: 06/02 0701 - 06/03 0700 In: 796.7 [P.O.:360; I.V.:405.9; IV Piggyback:30.8] Out: 2 [Urine:2] Intake/Output this shift: No intake/output data recorded.  PE: General: pleasant, WD/WNfemale who is sitting up in NAD HEENT: head is normocephalic, atraumatic. Sclera are anicteric.  Heart: regular, rate, and rhythm.  Lungs:Respiratory effort nonlabored HUD:JSHF,W[place], NT. Skin: no jaundice Psych: A&Ox4 with an appropriate affect   Lab Results:  Recent Labs    02/03/21 0120 02/04/21 0024  WBC 6.1 6.8  HGB 10.3* 11.1*  HCT 32.6* 34.5*  PLT 359 381   BMET Recent Labs    02/03/21 0120 02/04/21 0024  NA 139 138  K 3.9 3.4*  CL 106 105  CO2 26 25  GLUCOSE 128* 100*  BUN 7* 6*  CREATININE 0.90 0.77  CALCIUM 8.8* 9.1   PT/INR No results for input(s): LABPROT, INR in the last 72 hours. CMP     Component Value Date/Time   NA 138 02/04/2021 0024   K 3.4 (L) 02/04/2021 0024   CL 105 02/04/2021 0024   CO2 25 02/04/2021 0024   GLUCOSE 100 (H) 02/04/2021 0024   BUN 6 (L) 02/04/2021 0024   CREATININE 0.77 02/04/2021 0024   CALCIUM 9.1 02/04/2021 0024   PROT 7.1 02/04/2021 0024   ALBUMIN 3.3 (L) 02/04/2021 0024   AST 21 02/04/2021 0024   ALT 19 02/04/2021 0024   ALKPHOS 790 (H) 02/04/2021 0024   BILITOT 2.1 (H) 02/04/2021 0024   GFRNONAA >60 02/04/2021 0024   Lipase     Component Value Date/Time   LIPASE 31 02/02/2021 0815       Studies/Results: NM HEPATOBILIARY LEAK  (POST-SURGICAL)  Result Date: 02/03/2021 CLINICAL DATA:  Recurrent upper abdominal pain. Recent cholecystectomy. EXAM: NUCLEAR MEDICINE HEPATOBILIARY IMAGING TECHNIQUE: Sequential images of the abdomen were obtained out to 120 minutes following intravenous administration of radiopharmaceutical. RADIOPHARMACEUTICALS:  7 mCi Tc-12m Choletec IV COMPARISON:  None. FINDINGS: Prompt uptake and biliary excretion of activity by the liver is seen. No gallbladder activity is seen, consistent with prior cholecystectomy. Biliary activity probably passes into small bowel, consistent with patent common bile duct. There is gradual abnormal accumulation radiopharmaceutical activity within the porta hepatis, and perihepatic spaces along the right hepatic lobe liver dome. This is consistent with postop bile leak. IMPRESSION: Positive for postop bile leak. No evidence of biliary obstruction. Electronically Signed   By: JMarlaine HindM.D.   On: 02/03/2021 11:30    Anti-infectives: Anti-infectives (From admission, onward)   Start     Dose/Rate Route Frequency Ordered Stop   02/02/21 1800  piperacillin-tazobactam (ZOSYN) IVPB 3.375 g  Status:  Discontinued        3.375 g 12.5 mL/hr over 240 Minutes Intravenous Every 8 hours 02/02/21 1137 02/03/21 1158   02/02/21 1200  piperacillin-tazobactam (ZOSYN) IVPB 3.375 g  Status:  Discontinued        3.375 g 12.5 mL/hr over 240 Minutes Intravenous Every 8 hours 02/02/21 1135 02/02/21 1137   02/02/21 1200  piperacillin-tazobactam (ZOSYN) IVPB  3.375 g        3.375 g 100 mL/hr over 30 Minutes Intravenous  Once 02/02/21 1137 02/02/21 1242       Assessment/Plan Hx Laparoscopic Cholecystectomy with IOC on 01/04/21 by Dr. Georgette Dover. RUQ abdominal pain Hyperbilirubinemia  - CT A/P that showedsmall volume of ascites and areas of peritoneal enhancementbut nounexpected postoperative fluid collection in the gallbladder fossa.  - WBC 6.8. Alk phos 790 today. T. Bili 2.1 today, stable.  AST/ALT wnl.  - HIDA positive for post-op bile leak, GI consulted and planning ERCP today - diet can be advanced as tolerated after procedure - likely home tomorrow AM if doing well   FEN -NPO, IVF @50  cc/h VTE -SCDs ID -Zosyn 6/1>6/2  LOS: 2 days    Norm Parcel, [facility] Surgery 02/04/2021, 10:52 AM Please see Amion for pager number during day hours 7:00am-4:30pm

## 2021-02-04 NOTE — Anesthesia Preprocedure Evaluation (Addendum)
Anesthesia Evaluation  Patient identified by MRN, date of birth, ID band Patient awake    Reviewed: Allergy & Precautions, NPO status , Patient's Chart, lab work & pertinent test results  Airway Mallampati: II  TM Distance: >3 FB Neck ROM: Full    Dental  (+) Teeth Intact, Partial Upper   Pulmonary neg pulmonary ROS,    Pulmonary exam normal        Cardiovascular negative cardio ROS   Rhythm:Regular Rate:Normal     Neuro/Psych  Headaches, negative psych ROS   GI/Hepatic Neg liver ROS, Biliary leak s/p cholecystectomy 01/04/21   Endo/Other  negative endocrine ROS  Renal/GU negative Renal ROS  negative genitourinary   Musculoskeletal Right breast cancer   Abdominal (+)  Abdomen: soft. Bowel sounds: normal.  Peds  Hematology negative hematology ROS (+)   Anesthesia Other Findings   Reproductive/Obstetrics                            Anesthesia Physical Anesthesia Plan  ASA: II  Anesthesia Plan: General   Post-op Pain Management:    Induction: Intravenous  PONV Risk Score and Plan: 3 and Ondansetron, Dexamethasone and Treatment may vary due to age or medical condition  Airway Management Planned: Mask and Oral ETT  Additional Equipment: None  Intra-op Plan:   Post-operative Plan: Extubation in OR  Informed Consent: I have reviewed the patients History and Physical, chart, labs and discussed the procedure including the risks, benefits and alternatives for the proposed anesthesia with the patient or authorized representative who has indicated his/her understanding and acceptance.     Dental advisory given  Plan Discussed with: CRNA  Anesthesia Plan Comments: (Lab Results      Component                Value               Date                      WBC                      6.8                 02/04/2021                HGB                      11.1 (L)            02/04/2021                 HCT                      34.5 (L)            02/04/2021                MCV                      92.0                02/04/2021                PLT                      381  02/04/2021           Lab Results      Component                Value               Date                      NA                       138                 02/04/2021                K                        3.4 (L)             02/04/2021                CO2                      25                  02/04/2021                GLUCOSE                  100 (H)             02/04/2021                BUN                      6 (L)               02/04/2021                CREATININE               0.77                02/04/2021                CALCIUM                  9.1                 02/04/2021                GFRNONAA                 >60                 02/04/2021          )       Anesthesia Quick Evaluation

## 2021-02-04 NOTE — Progress Notes (Signed)
Pt walked 2 laps around unit after ERCP.  She stated she was starting to feel better after procedure.  She occasionally had moments of feeling mild pressure in her chest (NOT squeezing, sharp, feeling like an elephant sitting on her chest, twisting, or SOB). Moving around helped her belch a few times.  She reported 3x of a strong pain in the lower right and then lower right abdomen. It came and went quickly.  Pt tolerating clear liquid diet well.  At the end of the shift, she reported her abdomen feeling a little weird where the stents are.  PATIENT WANTS TO SPEAK WITH MD IN THE MORNING BEFORE D/C. SHE WANTS AN EXPLANATION AS TO WHY SHE HAS A PANCREATIC STENT.

## 2021-02-04 NOTE — Anesthesia Procedure Notes (Signed)
Procedure Name: Intubation Performed by: Reeves Dam, CRNA Pre-anesthesia Checklist: Patient identified, Patient being monitored, Timeout performed, Emergency Drugs available and Suction available Patient Re-evaluated:Patient Re-evaluated prior to induction Oxygen Delivery Method: Circle system utilized Preoxygenation: Pre-oxygenation with 100% oxygen Induction Type: IV induction Ventilation: Mask ventilation without difficulty Laryngoscope Size: Miller and 2 Grade View: Grade II Tube type: Oral Tube size: 7.0 mm Number of attempts: 1 Airway Equipment and Method: Stylet Placement Confirmation: ETT inserted through vocal cords under direct vision,  positive ETCO2 and breath sounds checked- equal and bilateral Secured at: 21 cm Tube secured with: Tape Dental Injury: Teeth and Oropharynx as per pre-operative assessment

## 2021-02-04 NOTE — Anesthesia Postprocedure Evaluation (Signed)
Anesthesia Post Note  Patient: Susan Chang  Procedure(s) Performed: ENDOSCOPIC RETROGRADE CHOLANGIOPANCREATOGRAPHY (ERCP) (N/A ) PANCREATIC STENT [place] PLACEMENT     Patient location during evaluation: Endoscopy Anesthesia Type: General Level of consciousness: awake and alert Pain management: pain level controlled Vital Signs Assessment: post-procedure vital signs reviewed and stable Respiratory status: spontaneous breathing, nonlabored ventilation, respiratory function stable and patient connected to nasal cannula oxygen Cardiovascular status: blood pressure returned to baseline and stable Postop Assessment: no apparent nausea or vomiting Anesthetic complications: no   No complications documented.  Last Vitals:  Vitals:   02/04/21 1242 02/04/21 1351  BP: 139/81 (!) 148/78  Pulse: 81 70  Resp: 12 16  Temp:  (!) 36.4 C  SpO2: 100% 100%    Last Pain:  Vitals:   02/04/21 1351  TempSrc: Oral  PainSc:                  Willis Kuipers COKER

## 2021-02-04 NOTE — Interval H&P Note (Signed)
History and Physical Interval Note:  02/04/2021 10:54 AM  Susan Chang  has presented today for surgery, with the diagnosis of bile leak.  The various methods of treatment have been discussed with the patient and family. After consideration of risks, benefits and other options for treatment, the patient has consented to  Procedure(s): ENDOSCOPIC RETROGRADE CHOLANGIOPANCREATOGRAPHY (ERCP) (N/A) as a surgical intervention.  The patient's history has been reviewed, patient examined, no change in status, stable for surgery.  I have reviewed the patient's chart and labs.  Questions were answered to the patient's satisfaction.     [name]

## 2021-02-04 NOTE — Care Management Important Message (Signed)
Important Message  Patient Details  Name: Susan Chang MRN: [id number] Date of Birth: 1950-12-31   Medicare Important Message Given:  Yes     Orbie Pyo 02/04/2021, 3:04 PM

## 2021-02-05 DIAGNOSIS — K839 Disease of biliary tract, unspecified: Secondary | ICD-10-CM | POA: Diagnosis not present

## 2021-02-05 LAB — BASIC METABOLIC PANEL
Anion gap: 11 (ref 5–15)
BUN: 6 mg/dL — ABNORMAL LOW (ref 8–23)
CO2: 23 mmol/L (ref 22–32)
Calcium: 9 mg/dL (ref 8.9–10.3)
Chloride: 103 mmol/L (ref 98–111)
Creatinine, Ser: 0.68 mg/dL (ref 0.44–1.00)
GFR, Estimated: >60 mL/min (ref >60)
Glucose, Bld: 125 mg/dL — ABNORMAL HIGH (ref 70–99)
Potassium: 3.8 mmol/L (ref 3.5–5.1)
Sodium: 137 mmol/L (ref 135–145)

## 2021-02-05 LAB — HEPATIC FUNCTION PANEL
ALT: 20 U/L (ref 0–44)
AST: 25 U/L (ref 15–41)
Albumin: 3.2 g/dL — ABNORMAL LOW (ref 3.5–5.0)
Alkaline Phosphatase: 678 U/L — ABNORMAL HIGH (ref 38–126)
Bilirubin, Direct: 0.5 mg/dL — ABNORMAL HIGH (ref 0.0–0.2)
Indirect Bilirubin: 1.3 mg/dL — ABNORMAL HIGH (ref 0.3–0.9)
Total Bilirubin: 1.8 mg/dL — ABNORMAL HIGH (ref 0.3–1.2)
Total Protein: 6.8 g/dL (ref 6.5–8.1)

## 2021-02-05 MED ORDER — ONDANSETRON 4 MG PO TBDP: 4.0000 mg | ORAL_TABLET | Freq: Four times a day (QID) | ORAL | 0 refills | Status: DC | PRN

## 2021-02-05 MED ORDER — ACETAMINOPHEN 325 MG PO TABS: 650.0000 mg | ORAL_TABLET | Freq: Four times a day (QID) | ORAL | Status: AC | PRN

## 2021-02-05 NOTE — Progress Notes (Signed)
Progress Note  1 Day Post-Op  Subjective: Patient reports some mild discomfort along right side and upper abdomen but overall is feeling much better. +flatus and had a small BM this AM. Denies nausea. Some soreness in throat. Hopeful to go home today.   Objective: Vital signs in last 24 hours: Temp:  [97.5 F (36.4 C)-98.8 F (37.1 C)] 98.4 F (36.9 C) (06/04 0543) Pulse Rate:  [70-88] 75 (06/04 0543) Resp:  [12-18] 16 (06/04 0543) BP: (127-163)/(66-81) 127/69 (06/04 0543) SpO2:  [99 %-100 %] 100 % (06/04 0543) Weight:  [64 kg] 64 kg (06/03 1032) Last BM Date: 02/03/21  Intake/Output from previous day: 06/03 0701 - 06/04 0700 In: 1086.5 [I.V.:1086.5] Out: 0  Intake/Output this shift: No intake/output data recorded.  PE: General: pleasant, WD/WNfemale who isambulating in room in NAD HEENT: head is normocephalic, atraumatic. Sclera are anicteric.  Heart: regular, rate, and rhythm.  Lungs:Respiratory effort nonlabored JOI:TGPQ,DI,YM. Skin:no jaundice Psych: A&Ox4 with an appropriate affect   Lab Results:  Recent Labs    02/03/21 0120 02/04/21 0024  WBC 6.1 6.8  HGB 10.3* 11.1*  HCT 32.6* 34.5*  PLT 359 381   BMET Recent Labs    02/04/21 0024 02/05/21 0052  NA 138 137  K 3.4* 3.8  CL 105 103  CO2 25 23  GLUCOSE 100* 125*  BUN 6* 6*  CREATININE 0.77 0.68  CALCIUM 9.1 9.0   PT/INR No results for input(s): LABPROT, INR in the last 72 hours. CMP     Component Value Date/Time   NA 137 02/05/2021 0052   K 3.8 02/05/2021 0052   CL 103 02/05/2021 0052   CO2 23 02/05/2021 0052   GLUCOSE 125 (H) 02/05/2021 0052   BUN 6 (L) 02/05/2021 0052   CREATININE 0.68 02/05/2021 0052   CALCIUM 9.0 02/05/2021 0052   PROT 6.8 02/05/2021 0052   ALBUMIN 3.2 (L) 02/05/2021 0052   AST 25 02/05/2021 0052   ALT 20 02/05/2021 0052   ALKPHOS 678 (H) 02/05/2021 0052   BILITOT 1.8 (H) 02/05/2021 0052   GFRNONAA >60 02/05/2021 0052   Lipase     Component  Value Date/Time   LIPASE 31 02/02/2021 0815       Studies/Results: DG ERCP  Result Date: 02/04/2021 CLINICAL DATA:  History of cholecystectomy on 01/04/2021 with evidence of postoperative bile leak. EXAM: ERCP TECHNIQUE: Multiple spot images obtained with the fluoroscopic device and submitted for interpretation post-procedure. FLUOROSCOPY TIME:  Fluoroscopy Time:  2 minutes 26 seconds Radiation Exposure Index (if provided by the fluoroscopic device): 8.65 mGy Number of Acquired Spot Images: 0 COMPARISON:  Nuclear medicine hepatobiliary study 02/03/2021 FINDINGS: Six intraoperative spot images are submitted for review. The images demonstrate a flexible duodenal scope in the descending duodenum. Subsequent images demonstrate cannulation of first the pancreatic duct with placement of a pancreatic stent followed by cannulation of the common bile duct and placement of a plastic biliary stent. Cholangiography demonstrates no evidence of biliary duct obstruction or visible leak. IMPRESSION: ERCP with placement of plastic pancreatic and biliary duct stents. These images were submitted for radiologic interpretation only. Please see the procedural report for the amount of contrast and the fluoroscopy time utilized. Electronically Signed   By: Jacqulynn Cadet M.D.   On: 02/04/2021 12:41   NM HEPATOBILIARY LEAK (POST-SURGICAL)  Result Date: 02/03/2021 CLINICAL DATA:  Recurrent upper abdominal pain. Recent cholecystectomy. EXAM: NUCLEAR MEDICINE HEPATOBILIARY IMAGING TECHNIQUE: Sequential images of the abdomen were obtained out to 120 minutes following  intravenous administration of radiopharmaceutical. RADIOPHARMACEUTICALS:  7 mCi Tc-71m Choletec IV COMPARISON:  None. FINDINGS: Prompt uptake and biliary excretion of activity by the liver is seen. No gallbladder activity is seen, consistent with prior cholecystectomy. Biliary activity probably passes into small bowel, consistent with patent common bile duct. There  is gradual abnormal accumulation radiopharmaceutical activity within the porta hepatis, and perihepatic spaces along the right hepatic lobe liver dome. This is consistent with postop bile leak. IMPRESSION: Positive for postop bile leak. No evidence of biliary obstruction. Electronically Signed   By: JMarlaine HindM.D.   On: 02/03/2021 11:30    Anti-infectives: Anti-infectives (From admission, onward)   Start     Dose/Rate Route Frequency Ordered Stop   02/02/21 1800  piperacillin-tazobactam (ZOSYN) IVPB 3.375 g  Status:  Discontinued        3.375 g 12.5 mL/hr over 240 Minutes Intravenous Every 8 hours 02/02/21 1137 02/03/21 1158   02/02/21 1200  piperacillin-tazobactam (ZOSYN) IVPB 3.375 g  Status:  Discontinued        3.375 g 12.5 mL/hr over 240 Minutes Intravenous Every 8 hours 02/02/21 1135 02/02/21 1137   02/02/21 1200  piperacillin-tazobactam (ZOSYN) IVPB 3.375 g        3.375 g 100 mL/hr over 30 Minutes Intravenous  Once 02/02/21 1137 02/02/21 1242       Assessment/Plan Hx Laparoscopic Cholecystectomy with IOC on 01/04/21 by Dr. TGeorgette Dover RUQ abdominal pain Hyperbilirubinemia  - CT A/P that showedsmall volume of ascites and areas of peritoneal enhancementbut nounexpected postoperative fluid collection in the gallbladder fossa.  -HIDA positive for post-op bile leak - s/p ERCP yesterday with placement of CBD stent and pancreatic stent  [facility] - Tbili and Alk phos downtrending - home as long as cleared by GI  FEN -HH diet, SLIV VTE -SCDs ID -Zosyn6/1>6/2  LOS: 3 days    KNorm Parcel P[facility] CenterSur[facility] 02/05/2021, 8:15 AM Please see Amion for pager number during day hours 7:00am-4:30pm

## 2021-02-05 NOTE — Discharge Summary (Signed)
Susan Chang   Susan Chang MRN: [id number] DOB/AGE: July 13, 1951 70 y.o.  Admit date: 02/02/2021 Discharge date: 02/05/2021  Admitting Diagnosis: S/P laparoscopic cholecystectomy 01/04/21 Abdominal pain  Mild hyperbilirubinemia  Elevated alkaline phosphatase   Discharge Diagnosis Post-operative bile leak S/P biliary and pancreatic stent placement   Consultants Gastroenterology   Imaging: DG ERCP  Result Date: 02/04/2021 CLINICAL DATA:  History of cholecystectomy on 01/04/2021 with evidence of postoperative bile leak. EXAM: ERCP TECHNIQUE: Multiple spot images obtained with the fluoroscopic device and submitted for interpretation post-procedure. FLUOROSCOPY TIME:  Fluoroscopy Time:  2 minutes 26 seconds Radiation Exposure Index (if provided by the fluoroscopic device): 8.65 mGy Number of Acquired Spot Images: 0 COMPARISON:  Nuclear medicine hepatobiliary study 02/03/2021 FINDINGS: Six intraoperative spot images are submitted for review. The images demonstrate a flexible duodenal scope in the descending duodenum. Subsequent images demonstrate cannulation of first the pancreatic duct with placement of a pancreatic stent followed by cannulation of the common bile duct and placement of a plastic biliary stent. Cholangiography demonstrates no evidence of biliary duct obstruction or visible leak. IMPRESSION: ERCP with placement of plastic pancreatic and biliary duct stents. These images were submitted for radiologic interpretation only. Please see the procedural report for the amount of contrast and the fluoroscopy time utilized. Electronically Signed   By: Jacqulynn Cadet M.D.   On: 02/04/2021 12:41   NM HEPATOBILIARY LEAK (POST-SURGICAL)  Result Date: 02/03/2021 CLINICAL DATA:  Recurrent upper abdominal pain. Recent cholecystectomy. EXAM: NUCLEAR MEDICINE HEPATOBILIARY IMAGING TECHNIQUE: Sequential images of the abdomen were obtained out to 120 minutes  following intravenous administration of radiopharmaceutical. RADIOPHARMACEUTICALS:  7 mCi Tc-44m  Choletec IV COMPARISON:  None. FINDINGS: Prompt uptake and biliary excretion of activity by the liver is seen. No gallbladder activity is seen, consistent with prior cholecystectomy. Biliary activity probably passes into small bowel, consistent with patent common bile duct. There is gradual abnormal accumulation radiopharmaceutical activity within the porta hepatis, and perihepatic spaces along the right hepatic lobe liver dome. This is consistent with postop bile leak. IMPRESSION: Positive for postop bile leak. No evidence of biliary obstruction. Electronically Signed   By: Marlaine Hind M.D.   On: 02/03/2021 11:30    Procedures Dr. Arta Silence (02/04/21) - ERCP with biliary and pancreatic stent placement   Hospital Course:  Susan is a 70 year old female s/p laparoscopic cholecystectomy 01/04/21 by Dr. 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  Workup showed elevated bilirubin and alkaline phosphatase but no post-op fluid collection.  Susan was admitted and HIDA obtained. This was suggestive of post-operative bile leak. GI was consulted and ERCP done as listed above.  Tolerated procedure well and was transferred to the floor.  Diet was advanced as tolerated.  On 02/05/21, the Susan was voiding well, tolerating diet, ambulating well, pain well controlled, vital signs stable, incisions c/d/i and felt stable for discharge home.  Susan will follow up in our office as needed and knows to call with questions or concerns. She will call to confirm appointment date/time.     Allergies as of 02/05/2021   No Known Allergies     Medication List    STOP taking these medications   lipase/protease/amylase 12000-38000 units Cpep capsule Commonly known as: CREON     TAKE these medications   acetaminophen 325 MG tablet Commonly known as:  TYLENOL Take 2 tablets (650 mg total) by mouth every 6 (  six) hours as needed for mild pain or moderate pain.   ondansetron 4 MG disintegrating tablet Commonly known as: ZOFRAN-ODT Take 1 tablet (4 mg total) by mouth every 6 (six) hours as needed for nausea.   Vitamin D (Ergocalciferol) 1.25 MG (50000 UNIT) Caps capsule Commonly known as: DRISDOL TAKE 1 CAPSULE BY MOUTH EVERY 7 DAYS         Follow-up Information    Arta Silence, MD. Call.   Specialty: Gastroenterology Why: Call and schedule follow up  Contact information: [address] [place] [address] [phone]        [facility] Call.   Specialty: General Surgery Why: Call and follow up as needed Contact information: [address] [place] [address] [phone]        Mosie Lukes, MD Follow up.   Specialty: Family Medicine Why: Follow up as needed  Contact information: [place] [address] [phone]               Signed: Norm Parcel , [facility] Surgery 02/05/2021, 8:20 AM Please see Amion for pager number during day hours 7:00am-4:30pm

## 2021-02-05 NOTE — Progress Notes (Signed)
Subjective: Abdominal pain improving, but not resolved.  Objective: Vital signs in last 24 hours: Temp:  [98.4 F (36.9 C)] 98.4 F (36.9 C) (06/04 0543) Pulse Rate:  [75] 75 (06/04 0543) Resp:  [16] 16 (06/04 0543) BP: (127)/(69) 127/69 (06/04 0543) SpO2:  [100 %] 100 % (06/04 0543) Weight change:  Last BM Date: 02/03/21  PE: GEN:  NAD HEENT:  Scant jaundice  Lab Results: CBC    Component Value Date/Time   WBC 6.8 02/04/2021 0024   RBC 3.75 (L) 02/04/2021 0024   HGB 11.1 (L) 02/04/2021 0024   HCT 34.5 (L) 02/04/2021 0024   PLT 381 02/04/2021 0024   MCV 92.0 02/04/2021 0024   MCH 29.6 02/04/2021 0024   MCHC 32.2 02/04/2021 0024   RDW 13.1 02/04/2021 0024   LYMPHSABS 1.6 02/02/2021 0815   MONOABS 0.6 02/02/2021 0815   EOSABS 0.1 02/02/2021 0815   BASOSABS 0.1 02/02/2021 0815   CMP     Component Value Date/Time   NA 137 02/05/2021 0052   K 3.8 02/05/2021 0052   CL 103 02/05/2021 0052   CO2 23 02/05/2021 0052   GLUCOSE 125 (H) 02/05/2021 0052   BUN 6 (L) 02/05/2021 0052   CREATININE 0.68 02/05/2021 0052   CALCIUM 9.0 02/05/2021 0052   PROT 6.8 02/05/2021 0052   ALBUMIN 3.2 (L) 02/05/2021 0052   AST 25 02/05/2021 0052   ALT 20 02/05/2021 0052   ALKPHOS 678 (H) 02/05/2021 0052   BILITOT 1.8 (H) 02/05/2021 0052   GFRNONAA >60 02/05/2021 0052   Assessment:  1.  Bile leak by HIDA, POD #1 ERCP with PD stent, biliary sphincterotomy, CBD stent. 2.  Abdominal pain post cholecystectomy, in part at least due to #1 above.  Improving after ERCP.  Plan:  1.  OK to discharge home from GI perspective. 2.  Outpatient follow-up with me in 3-4 weeks, and then repeat ERCP with stent removal in ~ 6 weeks. 3.  [facility] will sign-off; please call with questions; thank you for the consultation.   [name] 02/05/2021, 3:23 PM   Cell [phone] If no answer or after 5 PM call [phone]

## 2021-02-05 NOTE — Discharge Instructions (Signed)
Endoscopic Retrograde Cholangiopancreatogram, Care After This sheet gives you information about how to care for yourself after your procedure. Your health care provider may also give you more specific instructions. If you have problems or questions, contact your health care provider. What can I expect after the procedure? After the procedure, it is common to have:  Soreness in your throat.  Nausea.  Bloating.  Dizziness.  Tiredness (fatigue). Follow these instructions at home:  Take over-the-counter and prescription medicines only as told by your health care provider.  If you were prescribed an antibiotic medicine, take it as told by your health care provider. Do not stop using the antibiotic even if you start to feel better.  If you were given a sedative during the procedure, it can affect you for several hours. Do not drive or operate machinery until your health care provider says that it is safe.  Have someone stay with you for 24 hours after the procedure.  Return to your normal activities as told by your health care provider. Ask your health care provider what activities are safe for you.  Return to eating what you normally do as soon as you feel well enough or as told by your health care provider.  Keep all follow-up visits as told by your health care provider. This is important.   Contact a health care provider if you:  Have pain in your abdomen that does not get better with medicine.  Develop signs of infection, such as: ? Chills or fever. ? Feeling unwell. Get help right away if you:  Have difficulty swallowing.  Have worsening pain in your throat, chest, or abdomen.  Vomit bright red blood or a substance that looks like coffee grounds.  Have bloody or black, tarry stools.  Have a fever.  Have a sudden increase in swelling (bloating) in your abdomen. Summary  After the procedure, it is common to feel tired, and to have some discomfort in your throat or some  bloating of your abdomen.  If you were given a sedative during the procedure, it can affect you for several hours. Do not drive or operate machinery until your health care provider says that it is safe. Have someone stay with you for 24 hours after the procedure.  Contact your health care provider if you have signs of infection, such as chills, fever, feeling unwell, or if you have pain that does not improve with medicine.  Get help right away if you have trouble swallowing, worsening pain, bloody or black vomit, bloody or black stools, a fever, or increased swelling in your abdomen. This information is not intended to replace advice given to you by your health care provider. Make sure you discuss any questions you have with your health care provider. Document Revised: 05/06/2019 Document Reviewed: 05/06/2019 Elsevier Patient Education  [place].

## 2021-02-05 NOTE — Plan of Care (Signed)

## 2021-02-07 ENCOUNTER — Encounter (HOSPITAL_COMMUNITY): Payer: Self-pay | Admitting: Gastroenterology

## 2021-02-10 ENCOUNTER — Telehealth: Payer: Commercial Managed Care - HMO | Admitting: Family Medicine

## 2021-02-12 ENCOUNTER — Encounter: Payer: Self-pay | Admitting: Family Medicine

## 2021-02-14 ENCOUNTER — Encounter: Payer: Self-pay | Admitting: Family Medicine

## 2021-02-14 NOTE — Telephone Encounter (Signed)
Pt is scheduled for Wednesday at 9:00 am

## 2021-02-16 ENCOUNTER — Telehealth: Payer: Self-pay

## 2021-02-16 ENCOUNTER — Telehealth (INDEPENDENT_AMBULATORY_CARE_PROVIDER_SITE_OTHER): Payer: Medicare HMO | Admitting: Family Medicine

## 2021-02-16 ENCOUNTER — Other Ambulatory Visit: Payer: Self-pay

## 2021-02-16 DIAGNOSIS — L309 Dermatitis, unspecified: Secondary | ICD-10-CM

## 2021-02-16 DIAGNOSIS — R109 Unspecified abdominal pain: Secondary | ICD-10-CM

## 2021-02-16 DIAGNOSIS — R748 Abnormal levels of other serum enzymes: Secondary | ICD-10-CM

## 2021-02-16 MED ORDER — TRIAMCINOLONE ACETONIDE 0.1 % EX CREA
1.0000 | TOPICAL_CREAM | Freq: Two times a day (BID) | CUTANEOUS | 0 refills | Status: DC | PRN
Start: 2021-02-16 — End: 2023-10-19

## 2021-02-16 NOTE — Telephone Encounter (Signed)
Called to set up lab appt and she needs a fu appt late august or early September

## 2021-02-16 NOTE — Assessment & Plan Note (Addendum)
She has developed a erythematous macular rash scattered in the distribution of where the clear plastic tape was on her arm after her last surgery. She has tried mild over the counter steroid creams without results she has not started the recommended Zyrtec and pepcid but if symptoms persist she will try. For now will send in Triamcinolone cream to try bit prn.

## 2021-02-16 NOTE — Assessment & Plan Note (Signed)
She had a cholecystectomy and then developed abdominal pain and was found to have a biliary leak. She now has stents in place that are due to be removed next month. Will repeat labs including cbc with diff, cmp and lipase

## 2021-02-16 NOTE — Progress Notes (Signed)
Virtual telephone visit    Virtual Visit via Telephone Note   This visit type was conducted due to national recommendations for restrictions regarding the COVID-19 Pandemic (e.g. social distancing) in an effort to limit this patient's exposure and mitigate transmission in our community. Due to her co-morbid illnesses, this patient is at least at moderate risk for complications without adequate follow up. This format is felt to be most appropriate for this patient at this time. The patient did not have access to video technology or had technical difficulties with video requiring transitioning to audio format only (telephone). Physical exam was limited to content and character of the telephone converstion. S Chism, CMA was able to get the patient set up on a telephone visit.   Patient location: home Patient and provider in visit Provider location: Office  I discussed the limitations of evaluation and management by telemedicine and the availability of in person appointments. The patient expressed understanding and agreed to proceed.   Visit Date: 02/16/2021  Today's healthcare provider: Penni Homans, MD     Subjective:    Patient ID: Susan Chang, female    DOB: 21-Jun-1951, 70 y.o.   MRN: [id number]  Chief Complaint  Patient presents with   Rash    Pt states that rash started 5 days.    HPI Patient is in today for evaluation of a rash and abdominal pain. Patient has had 3 surgeries including her initial cholecystectomy. She developed a biliary leak and abdominal pain and now has stents in place. No nausea, vomiting, diarrhea, blood in stool, constipation, anorexia, fevers or chills. She does note some intermittent bloating. She has a rash where some clear plastic tape was used on her arm, it is raised, itchy and red. It has not responded to Cortisone cream. Denies CP/palp/SOB/HA/congestion/fevers or GU c/o. Taking meds as prescribed   Past Medical History:  Diagnosis Date    Breast cancer ([place]) 2000   right   Osteopenia    Preventative health care 07/13/2016   Vitamin D deficiency 11/14/2015    Past Surgical History:  Procedure Laterality Date   ABDOMINAL HYSTERECTOMY  2001   TAH b/l SPO for ovarian cyst,   APPENDECTOMY     BILIARY STENT PLACEMENT  02/04/2021   Procedure: BILIARY STENT PLACEMENT;  Surgeon: Arta Silence, MD;  Location: [place];  Service: Endoscopy;;   BREAST BIOPSY Right 2000   BREAST SURGERY  2000   cancer, right lumpectomy   CHOLECYSTECTOMY N/A 01/04/2021   Procedure: LAPAROSCOPIC CHOLECYSTECTOMY WITH INTRAOPERATIVE CHOLANGIOGRAM;  Surgeon: Donnie Mesa, MD;  Location: [facility];  Service: General;  Laterality: N/A;   ERCP N/A 02/04/2021   Procedure: ENDOSCOPIC RETROGRADE CHOLANGIOPANCREATOGRAPHY (ERCP);  Surgeon: Arta Silence, MD;  Location[facility] ENDOSCOPY;  Service: Endoscopy;  Laterality: N/A;   LYMPH NODE DISSECTION Right 2000   PANCREATIC STENT PLACEMENT  02/04/2021   Procedure: PANCREATIC STENT PLACEMENT;  Surgeon: Arta Silence, MD;  Location: [place];  Service: Endoscopy;;   SPHINCTEROTOMY  02/04/2021   Procedure: Joan Mayans;  Surgeon: Arta Silence, MD;  Location: [facility] ENDOSCOPY;  Service: Endoscopy;;  Needle Knife    Family History  Problem Relation Age of Onset   Cancer Mother        Breast cancer   Hypertension Mother    Stroke Mother    Cancer Father        lung cancer   Cancer Paternal Aunt        breast cancer   Cancer Brother  Social History   Socioeconomic History   Marital status: Married    Spouse name: Not on file   Number of children: Not on file   Years of education: Not on file   Highest education level: Not on file  Occupational History   Occupation: Production manager  Tobacco Use   Smoking status: Never   Smokeless tobacco: Never  Vaping Use   Vaping Use: Never used  Substance and Sexual Activity   Alcohol use: Not Currently    Alcohol/week: 0.0 standard drinks   Drug use: No    Sexual activity: Yes    Comment: lives with husband, drafting and design, no dietary restrictions  Other Topics Concern   Not on file  Social History Narrative   No major dietary restrictions but eats very little meat   Stays very physically active. 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     ondansetron (ZOFRAN-ODT) 4 MG disintegrating tablet Take 1 tablet (4 mg total) by mouth every 6 (six) hours as needed for nausea. 20 tablet 0   Vitamin D, Ergocalciferol, (DRISDOL) 1.25 MG (50000 UNIT) CAPS capsule TAKE 1 CAPSULE BY MOUTH EVERY 7 DAYS (Patient not taking: No sig reported) 4 capsule 4   No facility-administered medications prior to visit.    Allergies  Allergen Reactions   Wound Dressing Adhesive     Clear plastic ttape caused rash    Review of Systems  Constitutional:  Negative for fever and malaise/fatigue.  HENT:  Negative for congestion.   Eyes:  Negative for blurred vision.  Respiratory:  Negative for shortness of breath.   Cardiovascular:  Negative for chest pain, palpitations and leg swelling.  Gastrointestinal:  Positive for abdominal pain. Negative for blood in stool, constipation, diarrhea, heartburn, melena, nausea and vomiting.  Genitourinary:  Negative for dysuria and frequency.  Musculoskeletal:  Negative for falls.  Skin:  Positive for itching and rash.  Neurological:  Negative for dizziness, loss of consciousness and headaches.  Endo/Heme/Allergies:  Negative for environmental  allergies.  Psychiatric/Behavioral:  Negative for depression. The patient is not nervous/anxious.       Objective:    Physical Exam unable to obtain via phone  There were no vitals taken for this visit. Wt Readings from Last 3 Encounters:  02/04/21 141 lb 1.5 oz (64 kg)  01/04/21 148 lb (67.1 kg)  10/31/20 148 lb (67.1 kg)    Diabetic Foot Exam - Simple   No data filed    Lab Results  Component Value Date   WBC 6.8 02/04/2021   HGB 11.1 (L) 02/04/2021   HCT 34.5 (L) 02/04/2021   PLT 381 02/04/2021   GLUCOSE 125 (H) 02/05/2021   CHOL 226 (H) 08/02/2020   TRIG 81.0 08/02/2020   HDL 68.70 08/02/2020   LDLCALC 141 (H) 08/02/2020   ALT 20 02/05/2021   AST 25 02/05/2021   NA 137 02/05/2021   K 3.8 02/05/2021   CL 103 02/05/2021  CREATININE 0.68 02/05/2021   BUN 6 (L) 02/05/2021   CO2 23 02/05/2021   TSH 1.12 08/02/2020    Lab Results  Component Value Date   TSH 1.12 08/02/2020   Lab Results  Component Value Date   WBC 6.8 02/04/2021   HGB 11.1 (L) 02/04/2021   HCT 34.5 (L) 02/04/2021   MCV 92.0 02/04/2021   PLT 381 02/04/2021   Lab Results  Component Value Date   NA 137 02/05/2021   K 3.8 02/05/2021   CO2 23 02/05/2021   GLUCOSE 125 (H) 02/05/2021   BUN 6 (L) 02/05/2021   CREATININE 0.68 02/05/2021   BILITOT 1.8 (H) 02/05/2021   ALKPHOS 678 (H) 02/05/2021   AST 25 02/05/2021   ALT 20 02/05/2021   PROT 6.8 02/05/2021   ALBUMIN 3.2 (L) 02/05/2021   CALCIUM 9.0 02/05/2021   ANIONGAP 11 02/05/2021   GFR 74.05 08/02/2020   Lab Results  Component Value Date   CHOL 226 (H) 08/02/2020   Lab Results  Component Value Date   HDL 68.70 08/02/2020   Lab Results  Component Value Date   LDLCALC 141 (H) 08/02/2020   Lab Results  Component Value Date   TRIG 81.0 08/02/2020   Lab Results  Component Value Date   CHOLHDL 3 08/02/2020   No results found for: HGBA1C     Assessment & Plan:   Problem List Items Addressed This Visit     Abdominal  pain    She had a cholecystectomy and then developed abdominal pain and was found to have a biliary leak. She now has stents in place that are due to be removed next month. Will repeat labs including cbc with diff, cmp and lipase       Relevant Orders   CBC with Differential/Platelet   Lipase   Dermatitis    She has developed a erythematous macular rash scattered in the distribution of where the clear plastic tape was on her arm after her last surgery. She has tried mild over the counter steroid creams without results she has not started the recommended Zyrtec and pepcid but if symptoms persist she will try. For now will send in Triamcinolone cream to try bit prn.        Other Visit Diagnoses     Elevated alkaline phosphatase level    -  Primary   Relevant Orders   Comprehensive metabolic panel       I am having Susan Chang start on triamcinolone cream. I am also having her maintain her Vitamin D (Ergocalciferol), ondansetron, and acetaminophen.  Meds ordered this encounter  Medications   triamcinolone cream (KENALOG) 0.1 %    Sig: Apply 1 application topically 2 (two) times daily as needed.    Dispense:  80 g    Refill:  0     I discussed the assessment and treatment plan with the patient. The patient was provided an opportunity to ask questions and all were answered. The patient agreed with the plan and demonstrated an understanding of the instructions.   The patient was advised to call back or seek an in-person evaluation if the symptoms worsen or if the condition fails to improve as anticipated.  I provided 22 minutes of non-face-to-face time during this encounter.   Penni Homans, MD [facility] at [facility] [phone] (phone) [phone] (fax)  [place]

## 2021-02-17 ENCOUNTER — Other Ambulatory Visit: Payer: Self-pay

## 2021-02-17 ENCOUNTER — Other Ambulatory Visit (INDEPENDENT_AMBULATORY_CARE_PROVIDER_SITE_OTHER): Payer: Medicare HMO

## 2021-02-17 DIAGNOSIS — R109 Unspecified abdominal pain: Secondary | ICD-10-CM | POA: Diagnosis not present

## 2021-02-17 DIAGNOSIS — R748 Abnormal levels of other serum enzymes: Secondary | ICD-10-CM

## 2021-02-18 ENCOUNTER — Other Ambulatory Visit: Payer: Self-pay

## 2021-02-18 DIAGNOSIS — E782 Mixed hyperlipidemia: Secondary | ICD-10-CM

## 2021-02-18 DIAGNOSIS — R748 Abnormal levels of other serum enzymes: Secondary | ICD-10-CM

## 2021-02-18 LAB — CBC WITH DIFFERENTIAL/PLATELET
Basophils Absolute: 0.1 10*3/uL (ref 0.0–0.1)
Basophils Relative: 1.2 % (ref 0.0–3.0)
Eosinophils Absolute: 0.2 10*3/uL (ref 0.0–0.7)
Eosinophils Relative: 2.5 % (ref 0.0–5.0)
HCT: 34.2 % — ABNORMAL LOW (ref 36.0–46.0)
Hemoglobin: 11.4 g/dL — ABNORMAL LOW (ref 12.0–15.0)
Lymphocytes Relative: 43.4 % (ref 12.0–46.0)
Lymphs Abs: 2.6 10*3/uL (ref 0.7–4.0)
MCHC: 33.4 g/dL (ref 30.0–36.0)
MCV: 89.3 fl (ref 78.0–100.0)
Monocytes Absolute: 0.6 10*3/uL (ref 0.1–1.0)
Monocytes Relative: 10.2 % (ref 3.0–12.0)
Neutro Abs: 2.6 10*3/uL (ref 1.4–7.7)
Neutrophils Relative %: 42.7 % — ABNORMAL LOW (ref 43.0–77.0)
Platelets: 364 10*3/uL (ref 150.0–400.0)
RBC: 3.82 Mil/uL — ABNORMAL LOW (ref 3.87–5.11)
RDW: 13.9 % (ref 11.5–15.5)
WBC: 6.1 10*3/uL (ref 4.0–10.5)

## 2021-02-18 LAB — COMPREHENSIVE METABOLIC PANEL
ALT: 26 U/L (ref 0–35)
AST: 25 U/L (ref 0–37)
Albumin: 4.3 g/dL (ref 3.5–5.2)
Alkaline Phosphatase: 346 U/L — ABNORMAL HIGH (ref 39–117)
BUN: 10 mg/dL (ref 6–23)
CO2: 28 mEq/L (ref 19–32)
Calcium: 9.3 mg/dL (ref 8.4–10.5)
Chloride: 102 mEq/L (ref 96–112)
Creatinine, Ser: 0.76 mg/dL (ref 0.40–1.20)
GFR: 80.8 mL/min (ref >60.00)
Glucose, Bld: 97 mg/dL (ref 70–99)
Potassium: 3.9 mEq/L (ref 3.5–5.1)
Sodium: 139 mEq/L (ref 135–145)
Total Bilirubin: 1.1 mg/dL (ref 0.2–1.2)
Total Protein: 7.2 g/dL (ref 6.0–8.3)

## 2021-02-18 LAB — LIPASE: Lipase: 88 U/L — ABNORMAL HIGH (ref 11.0–59.0)

## 2021-02-22 ENCOUNTER — Other Ambulatory Visit (INDEPENDENT_AMBULATORY_CARE_PROVIDER_SITE_OTHER): Payer: Medicare HMO

## 2021-02-22 ENCOUNTER — Other Ambulatory Visit: Payer: Self-pay

## 2021-02-22 DIAGNOSIS — R748 Abnormal levels of other serum enzymes: Secondary | ICD-10-CM | POA: Diagnosis not present

## 2021-02-22 DIAGNOSIS — E782 Mixed hyperlipidemia: Secondary | ICD-10-CM | POA: Diagnosis not present

## 2021-02-22 LAB — AMYLASE: Amylase: 106 U/L (ref 27–131)

## 2021-02-22 LAB — LIPASE: Lipase: 34 U/L (ref 11.0–59.0)

## 2021-03-10 ENCOUNTER — Other Ambulatory Visit: Payer: Self-pay | Admitting: Gastroenterology

## 2021-03-10 DIAGNOSIS — K839 Disease of biliary tract, unspecified: Secondary | ICD-10-CM | POA: Diagnosis not present

## 2021-03-15 DIAGNOSIS — Z0100 Encounter for examination of eyes and vision without abnormal findings: Secondary | ICD-10-CM | POA: Diagnosis not present

## 2021-03-25 ENCOUNTER — Other Ambulatory Visit: Payer: Self-pay

## 2021-03-30 NOTE — Anesthesia Preprocedure Evaluation (Addendum)
Anesthesia Evaluation  Patient identified by MRN, date of birth, ID band Patient awake    Reviewed: Allergy & Precautions, Patient's Chart, lab work & pertinent test results  Airway Mallampati: II  TM Distance: >3 FB Neck ROM: Full    Dental no notable dental hx. (+) Partial Upper, Dental Advisory Given   Pulmonary neg pulmonary ROS,    Pulmonary exam normal breath sounds clear to auscultation       Cardiovascular Exercise Tolerance: Good negative cardio ROS Normal cardiovascular exam Rhythm:Regular Rate:Normal     Neuro/Psych  Headaches,    GI/Hepatic negative GI ROS, Neg liver ROS,   Endo/Other  negative endocrine ROS  Renal/GU negative Renal ROS     Musculoskeletal negative musculoskeletal ROS (+)   Abdominal   Peds  Hematology negative hematology ROS (+)   Anesthesia Other Findings   Reproductive/Obstetrics                            Anesthesia Physical Anesthesia Plan  ASA: 2  Anesthesia Plan: General   Post-op Pain Management:    Induction: Intravenous  PONV Risk Score and Plan: 3 and Treatment may vary due to age or medical condition  Airway Management Planned: Oral ETT  Additional Equipment: None  Intra-op Plan:   Post-operative Plan: Extubation in OR  Informed Consent: I have reviewed the patients History and Physical, chart, labs and discussed the procedure including the risks, benefits and alternatives for the proposed anesthesia with the patient or authorized representative who has indicated his/her understanding and acceptance.     Dental advisory given  Plan Discussed with: CRNA and Anesthesiologist  Anesthesia Plan Comments: (Bile leak for ERCP)       Anesthesia Quick Evaluation

## 2021-03-31 ENCOUNTER — Encounter (HOSPITAL_COMMUNITY)
Admission: RE | Disposition: A | Discharge status: Home / Self Care | Payer: Self-pay | Source: Home / Self Care | Attending: Gastroenterology

## 2021-03-31 ENCOUNTER — Ambulatory Visit (HOSPITAL_COMMUNITY): Payer: Medicare HMO

## 2021-03-31 ENCOUNTER — Ambulatory Visit (HOSPITAL_COMMUNITY)
Admission: RE | Admit: 2021-03-31 | Discharge: 2021-03-31 | Disposition: A | Discharge status: Home / Self Care | Payer: Medicare HMO | Attending: Gastroenterology | Admitting: Gastroenterology

## 2021-03-31 ENCOUNTER — Other Ambulatory Visit: Payer: Self-pay

## 2021-03-31 ENCOUNTER — Ambulatory Visit (HOSPITAL_COMMUNITY): Payer: Medicare HMO | Admitting: Anesthesiology

## 2021-03-31 ENCOUNTER — Encounter (HOSPITAL_COMMUNITY): Payer: Self-pay | Admitting: Gastroenterology

## 2021-03-31 DIAGNOSIS — E559 Vitamin D deficiency, unspecified: Secondary | ICD-10-CM | POA: Diagnosis not present

## 2021-03-31 DIAGNOSIS — Z801 Family history of malignant neoplasm of trachea, bronchus and lung: Secondary | ICD-10-CM | POA: Diagnosis not present

## 2021-03-31 DIAGNOSIS — Z466 Encounter for fitting and adjustment of urinary device: Secondary | ICD-10-CM | POA: Diagnosis not present

## 2021-03-31 DIAGNOSIS — Z888 Allergy status to other drugs, medicaments and biological substances status: Secondary | ICD-10-CM | POA: Insufficient documentation

## 2021-03-31 DIAGNOSIS — Z8249 Family history of ischemic heart disease and other diseases of the circulatory system: Secondary | ICD-10-CM | POA: Insufficient documentation

## 2021-03-31 DIAGNOSIS — Z803 Family history of malignant neoplasm of breast: Secondary | ICD-10-CM | POA: Diagnosis not present

## 2021-03-31 DIAGNOSIS — Z91048 Other nonmedicinal substance allergy status: Secondary | ICD-10-CM | POA: Insufficient documentation

## 2021-03-31 DIAGNOSIS — K8050 Calculus of bile duct without cholangitis or cholecystitis without obstruction: Secondary | ICD-10-CM | POA: Insufficient documentation

## 2021-03-31 DIAGNOSIS — Z4659 Encounter for fitting and adjustment of other gastrointestinal appliance and device: Secondary | ICD-10-CM | POA: Diagnosis not present

## 2021-03-31 DIAGNOSIS — K838 Other specified diseases of biliary tract: Secondary | ICD-10-CM | POA: Diagnosis not present

## 2021-03-31 DIAGNOSIS — Z79899 Other long term (current) drug therapy: Secondary | ICD-10-CM | POA: Insufficient documentation

## 2021-03-31 DIAGNOSIS — K839 Disease of biliary tract, unspecified: Secondary | ICD-10-CM

## 2021-03-31 DIAGNOSIS — K832 Perforation of bile duct: Secondary | ICD-10-CM | POA: Diagnosis not present

## 2021-03-31 HISTORY — PX: ERCP: SHX5425

## 2021-03-31 HISTORY — PX: REMOVAL OF STONES: SHX5545

## 2021-03-31 HISTORY — PX: STENT REMOVAL: SHX6421

## 2021-03-31 SURGERY — ERCP, WITH INTERVENTION IF INDICATED
Anesthesia: General | Laterality: Bilateral

## 2021-03-31 MED ORDER — GLUCAGON HCL RDNA (DIAGNOSTIC) 1 MG IJ SOLR
INTRAMUSCULAR | Status: DC | PRN
  Administered 2021-03-31: .5 mg via INTRAVENOUS

## 2021-03-31 MED ORDER — ROCURONIUM BROMIDE 10 MG/ML (PF) SYRINGE
PREFILLED_SYRINGE | INTRAVENOUS | Status: DC | PRN
  Administered 2021-03-31: 60 mg via INTRAVENOUS

## 2021-03-31 MED ORDER — SODIUM CHLORIDE 0.9 % IV SOLN
INTRAVENOUS | Status: DC | PRN
  Administered 2021-03-31: 20 mL

## 2021-03-31 MED ORDER — LACTATED RINGERS IV SOLN: INTRAVENOUS | Status: DC | PRN

## 2021-03-31 MED ORDER — FENTANYL CITRATE (PF) 100 MCG/2ML IJ SOLN
INTRAMUSCULAR | Status: AC
  Filled 2021-03-31: qty 2

## 2021-03-31 MED ORDER — PHENYLEPHRINE 40 MCG/ML (10ML) SYRINGE FOR IV PUSH (FOR BLOOD PRESSURE SUPPORT)
PREFILLED_SYRINGE | INTRAVENOUS | Status: DC | PRN
  Administered 2021-03-31: 160 ug via INTRAVENOUS

## 2021-03-31 MED ORDER — INDOMETHACIN 50 MG RE SUPP
RECTAL | Status: DC | PRN
  Administered 2021-03-31: 100 mg via RECTAL

## 2021-03-31 MED ORDER — PHENYLEPHRINE 40 MCG/ML (10ML) SYRINGE FOR IV PUSH (FOR BLOOD PRESSURE SUPPORT)
PREFILLED_SYRINGE | INTRAVENOUS | Status: DC | PRN
  Administered 2021-03-31: 80 ug via INTRAVENOUS

## 2021-03-31 MED ORDER — LIDOCAINE 2% (20 MG/ML) 5 ML SYRINGE
INTRAMUSCULAR | Status: DC | PRN
  Administered 2021-03-31: 60 mg via INTRAVENOUS

## 2021-03-31 MED ORDER — SODIUM CHLORIDE 0.9 % IV SOLN: INTRAVENOUS | Status: DC

## 2021-03-31 MED ORDER — SUGAMMADEX SODIUM 200 MG/2ML IV SOLN
INTRAVENOUS | Status: DC | PRN
  Administered 2021-03-31: 200 mg via INTRAVENOUS

## 2021-03-31 MED ORDER — DEXAMETHASONE SODIUM PHOSPHATE 10 MG/ML IJ SOLN
INTRAMUSCULAR | Status: DC | PRN
  Administered 2021-03-31: 10 mg via INTRAVENOUS

## 2021-03-31 MED ORDER — ONDANSETRON HCL 4 MG/2ML IJ SOLN
INTRAMUSCULAR | Status: DC | PRN
  Administered 2021-03-31: 4 mg via INTRAVENOUS

## 2021-03-31 MED ORDER — INDOMETHACIN 50 MG RE SUPP
RECTAL | Status: AC
  Filled 2021-03-31: qty 2

## 2021-03-31 MED ORDER — PROPOFOL 10 MG/ML IV BOLUS
INTRAVENOUS | Status: DC | PRN
  Administered 2021-03-31: 30 mg via INTRAVENOUS
  Administered 2021-03-31: 100 mg via INTRAVENOUS

## 2021-03-31 MED ORDER — GLUCAGON HCL RDNA (DIAGNOSTIC) 1 MG IJ SOLR
INTRAMUSCULAR | Status: AC
  Filled 2021-03-31: qty 1

## 2021-03-31 MED ORDER — CIPROFLOXACIN IN D5W 400 MG/200ML IV SOLN
INTRAVENOUS | Status: DC | PRN
  Administered 2021-03-31: 400 mg via INTRAVENOUS

## 2021-03-31 MED ORDER — FENTANYL CITRATE (PF) 100 MCG/2ML IJ SOLN
INTRAMUSCULAR | Status: DC | PRN
  Administered 2021-03-31: 50 ug via INTRAVENOUS

## 2021-03-31 MED ORDER — CIPROFLOXACIN IN D5W 400 MG/200ML IV SOLN
INTRAVENOUS | Status: AC
  Filled 2021-03-31: qty 200

## 2021-03-31 NOTE — Anesthesia Procedure Notes (Signed)
Procedure Name: Intubation Date/Time: 03/31/2021 11:55 AM Performed by: Niel Hummer, CRNA Pre-anesthesia Checklist: Patient identified, Emergency Drugs available, Suction available and Patient being monitored Patient Re-evaluated:Patient Re-evaluated prior to induction Oxygen Delivery Method: Circle System Utilized Preoxygenation: Pre-oxygenation with 100% oxygen Induction Type: IV induction Ventilation: Mask ventilation without difficulty Laryngoscope Size: Miller, 2 and Glidescope Grade View: Grade II Tube type: Oral Tube size: 7.0 mm Number of attempts: 1 Airway Equipment and Method: Stylet and Oral airway Placement Confirmation: ETT inserted through vocal cords under direct vision, positive ETCO2 and breath sounds checked- equal and bilateral Secured at: 23 cm Tube secured with: Tape Dental Injury: Teeth and Oropharynx as per pre-operative assessment  Difficulty Due To: Difficult Airway- due to anterior larynx, Difficulty was unanticipated and Difficult Airway- due to limited oral opening Future Recommendations: Recommend- induction with short-acting agent, and alternative techniques readily available

## 2021-03-31 NOTE — Discharge Instructions (Signed)
YOU HAD AN ENDOSCOPIC PROCEDURE TODAY: Refer to the procedure report and other information in the discharge instructions given to you for any specific questions about what was found during the examination. If this information does not answer your questions, please call [place] office at [phone] to clarify.   YOU SHOULD EXPECT: Some feelings of bloating in the abdomen. Passage of more gas than usual. Walking can help get rid of the air that was put into your GI tract during the procedure and reduce the bloating. If you had a lower endoscopy (such as a colonoscopy or flexible sigmoidoscopy) you may notice spotting of blood in your stool or on the toilet paper. Some abdominal soreness may be present for a day or two, also.  DIET: Your first meal following the procedure should be a light meal and then it is ok to progress to your normal diet. A half-sandwich or bowl of soup is an example of a good first meal. Heavy or fried foods are harder to digest and may make you feel nauseous or bloated. Drink plenty of fluids but you should avoid alcoholic beverages for 24 hours. If you had a esophageal dilation, please see attached instructions for diet.    ACTIVITY: Your care partner should take you home directly after the procedure. You should plan to take it easy, moving slowly for the rest of the day. You can resume normal activity the day after the procedure however YOU SHOULD NOT DRIVE, use power tools, machinery or perform tasks that involve climbing or major physical exertion for 24 hours (because of the sedation medicines used during the test).   SYMPTOMS TO REPORT IMMEDIATELY: A gastroenterologist can be reached at any hour. Please call [phone]  for any of the following symptoms:   Following upper endoscopy (EGD, EUS, ERCP, esophageal dilation) Vomiting of blood or coffee ground material  New, significant abdominal pain  New, significant chest pain or pain under the shoulder blades  Painful or  persistently difficult swallowing  New shortness of breath  Black, tarry-looking or red, bloody stools  FOLLOW UP:  If any biopsies were taken you will be contacted by phone or by letter within the next 1-3 weeks. Call [phone]  if you have not heard about the biopsies in 3 weeks.  Please also call with any specific questions about appointments or follow up tests.  

## 2021-03-31 NOTE — Anesthesia Postprocedure Evaluation (Signed)
Anesthesia Post Note  Patient: RIYANN RUSTIN  Procedure(s) Performed: ENDOSCOPIC RETROGRADE CHOLANGIOPANCREATOGRAPHY (ERCP) (Bilateral) STENT REMOVAL REMOVAL OF STONES     Patient location during evaluation: Endoscopy Anesthesia Type: General Level of consciousness: awake and alert Pain management: pain level controlled Vital Signs Assessment: post-procedure vital signs reviewed and stable Respiratory status: spontaneous breathing, nonlabored ventilation, respiratory function stable and patient connected to nasal cannula oxygen Cardiovascular status: blood pressure returned to baseline and stable Postop Assessment: no apparent nausea or vomiting Anesthetic complications: no   No notable events documented.  Last Vitals:  Vitals:   03/31/21 1300 03/31/21 1310  BP: 126/63 140/60  Pulse: 82 78  Resp: 19 14  Temp:    SpO2: 100% 100%    Last Pain:  Vitals:   03/31/21 1310  TempSrc:   PainSc: 3                  Barnet Glasgow

## 2021-03-31 NOTE — Transfer of Care (Signed)
Immediate Anesthesia Transfer of Care Note  Patient: Susan Chang  Procedure(s) Performed: ENDOSCOPIC RETROGRADE CHOLANGIOPANCREATOGRAPHY (ERCP) (Bilateral) STENT REMOVAL REMOVAL OF STONES  Patient Location: Endoscopy Unit  Anesthesia Type:General  Level of Consciousness: awake  Airway & Oxygen Therapy: Patient Spontanous Breathing and Patient connected to face mask oxygen  Post-op Assessment: Report given to RN and Post -op Vital signs reviewed and stable  Post vital signs: Reviewed and stable  Last Vitals:  Vitals Value Taken Time  BP    Temp    Pulse    Resp    SpO2      Last Pain:  Vitals:   03/31/21 1052  TempSrc: Oral  PainSc: 0-No pain         Complications: No notable events documented.

## 2021-03-31 NOTE — H&P (Signed)
[facility] H/P Note  Chief Complaint: bile leak HPI: Susan Chang is an 70 y.o. female.  ERCP with biliary stent placed for bile leak about 2 months ago.  Presents for repeat ERCP and stent pull and repeat cholangiogram.  She has been doing well post biliary stent placement.  Past Medical History:  Diagnosis Date   Breast cancer ([place]) 2000   right   Osteopenia    Preventative health care 07/13/2016   Vitamin D deficiency 11/14/2015    Past Surgical History:  Procedure Laterality Date   ABDOMINAL HYSTERECTOMY  2001   TAH b/l SPO for ovarian cyst,   APPENDECTOMY     BILIARY STENT PLACEMENT  02/04/2021   Procedure: BILIARY STENT PLACEMENT;  Surgeon: Arta Silence, MD;  Location: [place];  Service: Endoscopy;;   BREAST BIOPSY Right 2000   BREAST SURGERY  2000   cancer, right lumpectomy   CHOLECYSTECTOMY N/A 01/04/2021   Procedure: LAPAROSCOPIC CHOLECYSTECTOMY WITH INTRAOPERATIVE CHOLANGIOGRAM;  Surgeon: Donnie Mesa, MD;  Location: [place];  Service: General;  Laterality: N/A;   ERCP N/A 02/04/2021   Procedure: ENDOSCOPIC RETROGRADE CHOLANGIOPANCREATOGRAPHY (ERCP);  Surgeon: Arta Silence, MD;  [facility];  Service: Endoscopy;  Laterality: N/A;   LYMPH NODE DISSECTION Right 2000   PANCREATIC STENT PLACEMENT  02/04/2021   Procedure: PANCREATIC STENT PLACEMENT;  Surgeon: Arta Silence, MD;  Location: [facility]COPY;  Service: Endoscopy;;   SPHINCTEROTOMY  02/04/2021   Procedure: Joan Mayans;  Surgeon: Arta Silence, MD;  Location: [facility] ENDOSCOPY;  Service: Endoscopy;;  Needle Knife    Medications Prior to Admission  Medication Sig Dispense Refill   Vitamin D, Ergocalciferol, (DRISDOL) 1.25 MG (50000 UNIT) CAPS capsule TAKE 1 CAPSULE BY MOUTH EVERY 7 DAYS (Patient taking differently: Take 50,000 Units by mouth every 7 (seven) days.) 4 capsule 4   acetaminophen (TYLENOL) 325 MG tablet Take 2 tablets (650 mg total) by mouth every 6 (six) hours as needed for  mild pain or moderate pain.     ondansetron (ZOFRAN-ODT) 4 MG disintegrating tablet Take 1 tablet (4 mg total) by mouth every 6 (six) hours as needed for nausea. (Patient not taking: Reported on 03/25/2021) 20 tablet 0   triamcinolone cream (KENALOG) 0.1 % Apply 1 application topically 2 (two) times daily as needed. (Patient taking differently: Apply 1 application topically 2 (two) times daily as needed (Itching).) 80 g 0    Allergies:  Allergies  Allergen Reactions   Wound Dressing Adhesive Other (See Comments)    Clear plastic tape caused rash    Family History  Problem Relation Age of Onset   Cancer Mother        Breast cancer   Hypertension Mother    Stroke Mother    Cancer Father        lung cancer   Cancer Paternal Aunt        breast cancer   Cancer Brother     Social History:  reports that she has never smoked. She has never used smokeless tobacco. She reports previous alcohol use. She reports that she does not use drugs.   ROS: As per HPI, all others negative   Blood pressure 125/69, pulse 91, temperature 99 F (37.2 C), temperature source Oral, resp. rate 17, height '5\' 5"'$  (1.651 m), weight 64.9 kg, SpO2 96 %. General appearance: NAD HEENT:  Anicteric Neck:  Supple ABD:  Soft Neuro:  A/O  No results found for this or any previous visit (from the past 48 hour(s)).  No results found.  Assessment/Plan   Bile leak, with biliary stent (and pancreatic stent) placed.  Plan for ERCP with stent removal(s) and repeat cholangiogram. Risks (up to and including bleeding, infection, perforation, pancreatitis that can be complicated by infected necrosis and death), benefits (removal of stones, alleviating blockage, decreasing risk of cholangitis or choledocholithiasis-related pancreatitis), and alternatives (watchful waiting, percutaneous transhepatic cholangiography) of ERCP were explained to patient/family in detail and patient elects to proceed.   [name] 03/31/2021, 11:27 AM

## 2021-03-31 NOTE — Op Note (Signed)
[facility] Patient Name: Susan Chang Procedure Date: 03/31/2021 MRN: [id number] Attending MD: Arta Silence , MD Date of Birth: 04/12/1951 CSN: [id number] Age: 70 Admit Type: Outpatient Procedure:                ERCP Indications:              Follow-up of bile leak Providers:                Arta Silence, MD, Terrall Laity, Kary Kos                            RN, RN, Gean Quint RN, Tyna Jaksch                            Technician Referring MD:              Medicines:                General Anesthesia, Cipro 400 mg IV, Indomethacin                            123XX123 mg PR Complications:            No immediate complications. Estimated Blood Loss:     Estimated blood loss: none. Procedure:                Pre-Anesthesia Assessment:                           - Prior to the procedure, a History and Physical                            was performed, and patient medications and                            allergies were reviewed. The patient's tolerance of                            previous anesthesia was also reviewed. The risks                            and benefits of the procedure and the sedation                            options and risks were discussed with the patient.                            All questions were answered, and informed consent                            was obtained. Prior Anticoagulants: The patient has                            taken no previous anticoagulant or antiplatelet                            agents. ASA Grade  Assessment: II - A patient with                            mild systemic disease. After reviewing the risks                            and benefits, the patient was deemed in                            satisfactory condition to undergo the procedure.                           After obtaining informed consent, the scope was                            passed under direct vision. Throughout the                             procedure, the patient's blood pressure, pulse, and                            oxygen saturations were monitored continuously. The                            Olympus [id number]-[id number]) was introduced through                            the mouth, and used to inject contrast into and                            used to cannulate the bile duct. The ERCP was                            accomplished without difficulty. The patient                            tolerated the procedure well. Scope In: Scope Out: Findings:      A scout film of the abdomen was obtained. Surgical clips, consistent       with a previous cholecystectomy, were seen in the area of the right       upper quadrant of the abdomen. A biliary stent was visible on the scout       film. The major papilla was normal. One stent was removed from the       biliary tree using a rat-toothed forceps. The bile duct was deeply       cannulated. Contrast was injected. I personally interpreted the bile       duct images. Ductal flow of contrast was adequate. The lower third of       the main bile duct contained two stones. The biliary tree was swept with       a basket starting at the upper third of the main bile duct, middle third       of the main bile duct and lower third of the main duct. Two stones were  removed. No stones remained. Impression:               - The major papilla appeared normal.                           - Choledocholithiasis was found. Complete removal                            was accomplished by sweeping.                           - One stent was removed from the biliary tree.                           - The biliary tree was swept. Moderate Sedation:      None Recommendation:           - Watch for pancreatitis, bleeding, perforation,                            and cholangitis.                           - Soft diet today.                           - Continue present medications.                            - Return to GI clinic PRN.                           - Return to referring physician as previously                            scheduled. Procedure Code(s):        --- Professional ---                           [phone], Endoscopic retrograde                            cholangiopancreatography (ERCP); with removal of                            foreign body(s) or stent(s) from biliary/pancreatic                            duct(s)                           43264, Endoscopic retrograde                            cholangiopancreatography (ERCP); with removal of                            calculi/debris from biliary/pancreatic duct(s) Diagnosis Code(s):        --- Professional ---  K80.50, Calculus of bile duct without cholangitis                            or cholecystitis without obstruction                           Z46.59, Encounter for fitting and adjustment of                            other gastrointestinal appliance and device                           K83.8, Other specified diseases of biliary tract CPT copyright 2019 American Medical Association. All rights reserved. The codes documented in this report are preliminary and upon coder review may  be revised to meet current compliance requirements. Arta Silence, MD 03/31/2021 1:28:46 PM This report has been signed electronically. Number of Addenda: 0

## 2021-04-01 ENCOUNTER — Encounter (HOSPITAL_COMMUNITY): Payer: Self-pay | Admitting: Gastroenterology

## 2021-04-15 ENCOUNTER — Other Ambulatory Visit: Payer: Self-pay

## 2021-04-15 ENCOUNTER — Ambulatory Visit (INDEPENDENT_AMBULATORY_CARE_PROVIDER_SITE_OTHER): Payer: Medicare HMO | Admitting: Medical

## 2021-04-15 VITALS — BP 125/61 | HR 89 | Resp 18 | Ht 65.0 in | Wt 143.0 lb

## 2021-04-15 DIAGNOSIS — R748 Abnormal levels of other serum enzymes: Secondary | ICD-10-CM

## 2021-04-15 DIAGNOSIS — Z1231 Encounter for screening mammogram for malignant neoplasm of breast: Secondary | ICD-10-CM

## 2021-04-15 DIAGNOSIS — K9189 Other postprocedural complications and disorders of digestive system: Secondary | ICD-10-CM | POA: Diagnosis not present

## 2021-04-15 NOTE — Progress Notes (Signed)
Subjective:    Patient ID: Susan Chang, female    DOB: 06/07/1951, 70 y.o.   MRN: [id number]  HPI  Pt in for follow up.   Pt had been treated in hospital about 2 weeks ago.  "[facility] H/P Note   Chief Complaint: bile leak HPI: Susan Chang is an 70 y.o. female.  ERCP with biliary stent placed for bile leak about 2 months ago.  Presents for repeat ERCP and stent pull and repeat cholangiogram.  She has been doing well post biliary stent placement.  Assessment/Plan    Bile leak, with biliary stent (and pancreatic stent) placed.  Plan for ERCP with stent removal(s) and repeat cholangiogram. Risks (up to and including bleeding, infection, perforation, pancreatitis that can be complicated by infected necrosis and death), benefits (removal of stones, alleviating blockage, decreasing risk of cholangitis or choledocholithiasis-related pancreatitis), and alternatives (watchful waiting, percutaneous transhepatic cholangiography) of ERCP were explained to patient/family in detail and patient elects to proceed."   Pt states no nausea, vomiting, no fever,no chills or diarrhea.  Pt states no follow up appointment required per specialiast.  Pt wants metabolic panel.  Hx of breast cancer. Surgery in 2000. Last mammogram.  Review of Systems  Constitutional:  Negative for chills, fatigue and fever.  Respiratory:  Negative for cough, chest tightness and shortness of breath.   Cardiovascular:  Negative for chest pain and palpitations.  Gastrointestinal:  Negative for abdominal distention, abdominal pain, blood in stool, constipation, diarrhea, nausea and vomiting.  Genitourinary:  Negative for dyspareunia, dysuria, flank pain, urgency and vaginal pain.  Musculoskeletal:  Negative for back pain.  Skin:  Negative for rash.  Neurological:  Negative for dizziness, seizures, speech difficulty, weakness and light-headedness.  Hematological:  Negative for adenopathy. Does not  bruise/bleed easily.  Psychiatric/Behavioral:  Negative for behavioral problems, confusion and sleep disturbance. The patient is not nervous/anxious.      Past Medical History:  Diagnosis Date   Breast cancer ([place]) 2000   right   Osteopenia    Preventative health care 07/13/2016   Vitamin D deficiency 11/14/2015     Social History   Socioeconomic History   Marital status: Married    Spouse name: Not on file   Number of children: Not on file   Years of education: Not on file   Highest education level: Not on file  Occupational History   Occupation: Production manager  Tobacco Use   Smoking status: Never   Smokeless tobacco: Never  Vaping Use   Vaping Use: Never used  Substance and Sexual Activity   Alcohol use: Not Currently    Alcohol/week: 0.0 standard drinks   Drug use: No   Sexual activity: Yes    Comment: lives with husband, drafting and design, no dietary restrictions  Other Topics Concern   Not on file  Social History Narrative   No major dietary restrictions but eats very little meat   Stays very physically active. Walks daily   Works [employer] broadcast assistant Strain: Not on file  Food Insecurity: Not on file  Transportation Needs: Not on file  Physical Activity: Not on file  Stress: Not on file  Social Connections: Not on file  Intimate Partner Violence: Not on file    Past Surgical History:  Procedure Laterality Date   ABDOMINAL HYSTERECTOMY  2001   TAH b/l SPO for ovarian cyst,   APPENDECTOMY     BILIARY STENT  PLACEMENT  02/04/2021   Procedure: BILIARY STENT PLACEMENT;  Surgeon: Arta Silence, MD;  Location: [place];  Service: Endoscopy;;   BREAST BIOPSY Right 2000   BREAST SURGERY  2000   cancer, right lumpectomy   CHOLECYSTECTOMY N/A 01/04/2021   Procedure: LAPAROSCOPIC CHOLECYSTECTOMY WITH INTRAOPERATIVE CHOLANGIOGRAM;  Surgeon: Donnie Mesa, MD;  Location: [place];  Service: General;   Laterality: N/A;   ERCP N/A 02/04/2021   Procedure: ENDOSCOPIC RETROGRADE CHOLANGIOPANCREATOGRAPHY (ERCP);  Surgeon: Arta Silence, MD;  Location: [facility] ENDOSCOPY;  Service: Endoscopy;  Laterality: N/A;   ERCP Bilateral 03/31/2021   Procedure: ENDOSCOPIC RETROGRADE CHOLANGIOPANCREATOGRAPHY (ERCP);  Surgeon: Arta Silence, MD;  Location: [facility];  Service: Endoscopy;  Laterality: Bilateral;   LYMPH NODE DISSECTION Right 2000   PANCREATIC STENT PLACEMENT  02/04/2021   Procedure: PANCREATIC STENT PLACEMENT;  Surgeon: Arta Silence, MD;  Location: [facility];  Service: Endoscopy;;   REMOVAL OF STONES  03/31/2021   Procedure: REMOVAL OF STONES;  Surgeon: Arta Silence, MD;  Location: [facility] ENDOSCOPY;  Service: Endoscopy;;   SPHINCTEROTOMY  02/04/2021   Procedure: Joan Mayans;  Surgeon: Arta Silence, MD;  Location: [facility];  Service: Endoscopy;;  Needle Knife   STENT REMOVAL  03/31/2021   Procedure: STENT REMOVAL;  Surgeon: Arta Silence, MD;  Location: [facility];  Service: Endoscopy;;    Family History  Problem Relation Age of Onset   Cancer Mother        Breast cancer   Hypertension Mother    Stroke Mother    Cancer Father        lung cancer   Cancer Paternal Aunt        breast cancer   Cancer Brother     Allergies  Allergen Reactions   Wound Dressing Adhesive Other (See Comments)    Clear plastic tape caused rash    Current Outpatient Medications on File Prior to Visit  Medication Sig Dispense Refill   acetaminophen (TYLENOL) 325 MG tablet Take 2 tablets (650 mg total) by mouth every 6 (six) hours as needed for mild pain or moderate pain.     triamcinolone cream (KENALOG) 0.1 % Apply 1 application topically 2 (two) times daily as needed. 80 g 0   Vitamin D, Ergocalciferol, (DRISDOL) 1.25 MG (50000 UNIT) CAPS capsule TAKE 1 CAPSULE BY MOUTH EVERY 7 DAYS 4 capsule 4   No current facility-administered medications on file prior to visit.    BP 125/61   Pulse 89    Resp 18   Ht '5\' 5"'$  (1.651 m)   Wt 143 lb (64.9 kg)   SpO2 100%   BMI 23.80 kg/m        Objective:   Physical Exam  General- No acute distress. Pleasant patient. Neck- Full range of motion, no jvd Lungs- Clear, even and unlabored. Heart- regular rate and rhythm. Neurologic- CNII- XII grossly intact.  Abdomen- soft, nt, nd, +bs, no rebound or guarding.  Back- no cva tenderness.      Assessment & Plan:   For status post ercp and stent removal after biliary leak will do lab cmp, cbc and lipase. Clinically stable.  If any recurrent abd pain or worsening/changing sign or symptoms let us know.  Mammogram order placed. You can talk to radiology today to get scheduled.  Follow up with pcp as regularly scheduled or as needed  Mackie Pai, PA-C   Time spent with patient today was  31 minutes which consisted of chart review, discussing diagnosis, work up treatment and documentation.

## 2021-04-15 NOTE — Patient Instructions (Addendum)
For status post ercp and stent removal after biliary leak will do lab cmp, cbc and lipase. Clinically stable.  If any recurrent abd pain or worsening/changing sign or symptoms let us know.  Mammogram order placed. You can talk to radiology today to get scheduled.  Follow up with pcp as regularly scheduled or as needed

## 2021-04-16 LAB — CBC WITH DIFFERENTIAL/PLATELET
Absolute Monocytes: 511 cells/uL (ref 200–950)
Basophils Absolute: 48 cells/uL (ref 0–200)
Basophils Relative: 0.7 %
Eosinophils Absolute: 69 cells/uL (ref 15–500)
Eosinophils Relative: 1 %
HCT: 38.4 % (ref 35.0–45.0)
Hemoglobin: 12.5 g/dL (ref 11.7–15.5)
Lymphs Abs: 2408 cells/uL (ref 850–3900)
MCH: 29.5 pg (ref 27.0–33.0)
MCHC: 32.6 g/dL (ref 32.0–36.0)
MCV: 90.6 fL (ref 80.0–100.0)
MPV: 9.3 fL (ref 7.5–12.5)
Monocytes Relative: 7.4 %
Neutro Abs: 3864 cells/uL (ref 1500–7800)
Neutrophils Relative %: 56 %
Platelets: 321 10*3/uL (ref 140–400)
RBC: 4.24 10*6/uL (ref 3.80–5.10)
RDW: 13.1 % (ref 11.0–15.0)
Total Lymphocyte: 34.9 %
WBC: 6.9 10*3/uL (ref 3.8–10.8)

## 2021-04-16 LAB — COMPREHENSIVE METABOLIC PANEL
AG Ratio: 1.6 (calc) (ref 1.0–2.5)
ALT: 21 U/L (ref 6–29)
AST: 22 U/L (ref 10–35)
Albumin: 4.6 g/dL (ref 3.6–5.1)
Alkaline phosphatase (APISO): 81 U/L (ref 37–153)
BUN: 12 mg/dL (ref 7–25)
CO2: 29 mmol/L (ref 20–32)
Calcium: 9.9 mg/dL (ref 8.6–10.4)
Chloride: 102 mmol/L (ref 98–110)
Creat: 0.81 mg/dL (ref 0.50–1.05)
Globulin: 2.8 g/dL (calc) (ref 1.9–3.7)
Glucose, Bld: 92 mg/dL (ref 65–99)
Potassium: 4.2 mmol/L (ref 3.5–5.3)
Sodium: 138 mmol/L (ref 135–146)
Total Bilirubin: 0.6 mg/dL (ref 0.2–1.2)
Total Protein: 7.4 g/dL (ref 6.1–8.1)

## 2021-04-16 LAB — LIPASE: Lipase: 36 U/L (ref 7–60)

## 2021-04-27 ENCOUNTER — Other Ambulatory Visit (HOSPITAL_BASED_OUTPATIENT_CLINIC_OR_DEPARTMENT_OTHER): Payer: Self-pay | Admitting: Medical

## 2021-04-27 DIAGNOSIS — Z1231 Encounter for screening mammogram for malignant neoplasm of breast: Secondary | ICD-10-CM

## 2021-05-10 ENCOUNTER — Ambulatory Visit (HOSPITAL_BASED_OUTPATIENT_CLINIC_OR_DEPARTMENT_OTHER): Payer: Medicare HMO

## 2021-05-18 ENCOUNTER — Other Ambulatory Visit (HOSPITAL_COMMUNITY): Payer: Self-pay

## 2021-05-24 ENCOUNTER — Other Ambulatory Visit: Payer: Self-pay

## 2021-05-24 ENCOUNTER — Ambulatory Visit (HOSPITAL_BASED_OUTPATIENT_CLINIC_OR_DEPARTMENT_OTHER)
Admission: RE | Admit: 2021-05-24 | Discharge: 2021-05-24 | Disposition: A | Discharge status: Home / Self Care | Payer: Medicare HMO | Source: Ambulatory Visit | Attending: Medical | Admitting: Medical

## 2021-05-24 ENCOUNTER — Encounter (HOSPITAL_BASED_OUTPATIENT_CLINIC_OR_DEPARTMENT_OTHER): Payer: Self-pay

## 2021-05-24 DIAGNOSIS — Z1231 Encounter for screening mammogram for malignant neoplasm of breast: Secondary | ICD-10-CM | POA: Insufficient documentation

## 2021-07-13 IMAGING — RF DG ERCP WO/W SPHINCTEROTOMY
1 series · 5 of 5 positions shown · non-contrast
Comparison: Nuclear medicine hepatobiliary study 02/03/2021

CLINICAL DATA: History of cholecystectomy on 01/04/2021 with
evidence of postoperative bile leak.

EXAM:
ERCP
TECHNIQUE: Multiple spot images obtained with the fluoroscopic device and
submitted for interpretation post-procedure.
FLUOROSCOPY TIME:  Fluoroscopy Time:  2 minutes 26 seconds
Radiation Exposure Index (if provided by the fluoroscopic device):
8.65 mGy
Number of Acquired Spot Images: 0

[Series 1: unknown protocol · 0.20mm/px · 5 of 5 slices shown]
[im 1/5]
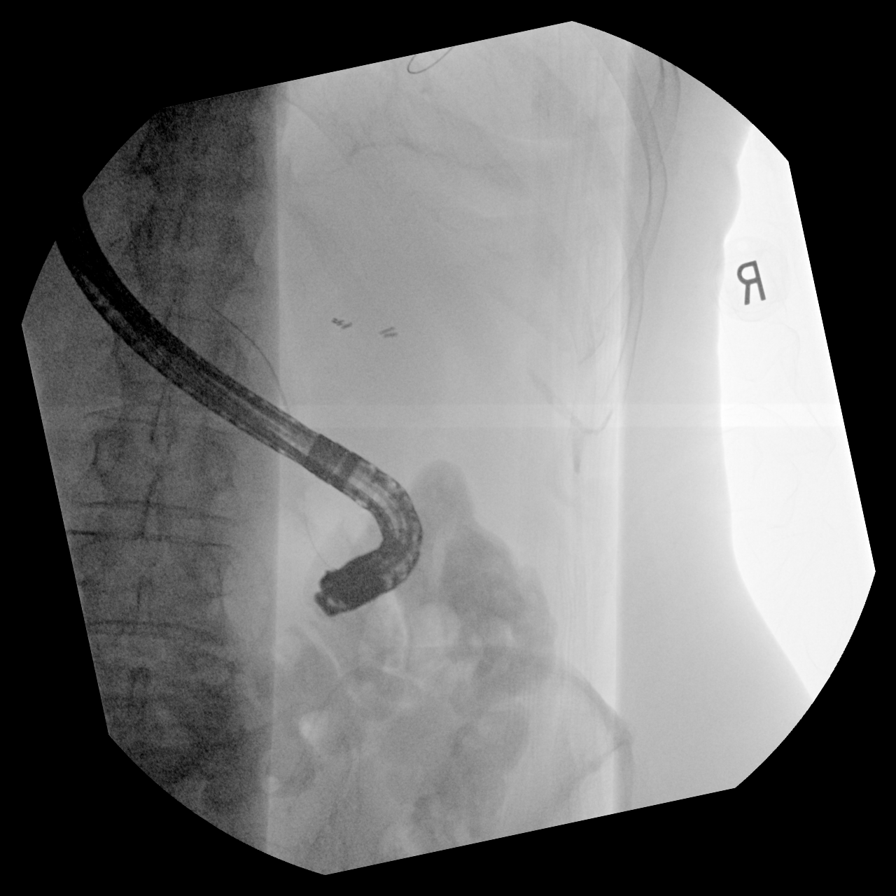
[im 2/5]
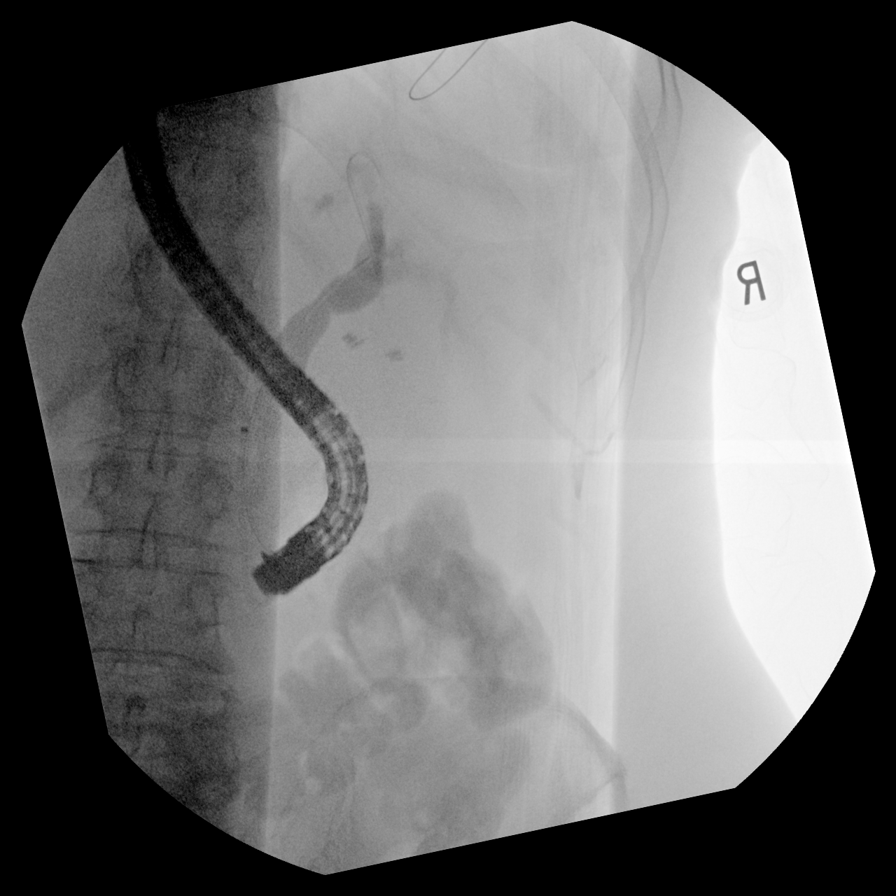
[im 3/5]
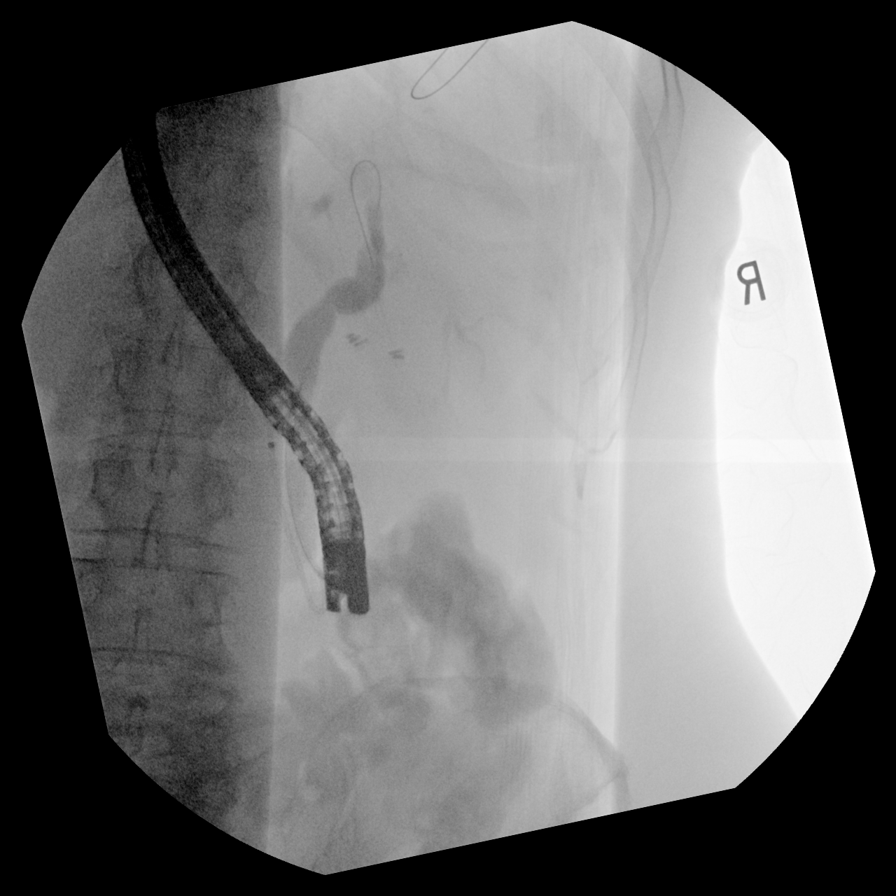
[im 4/5]
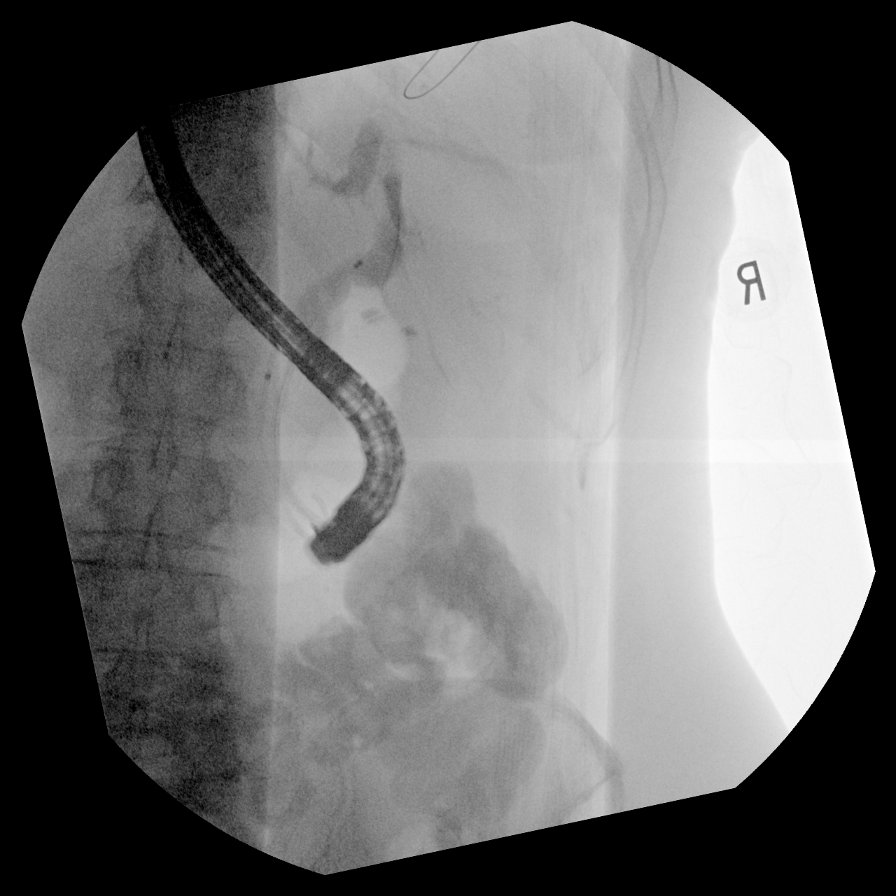
[im 5/5]
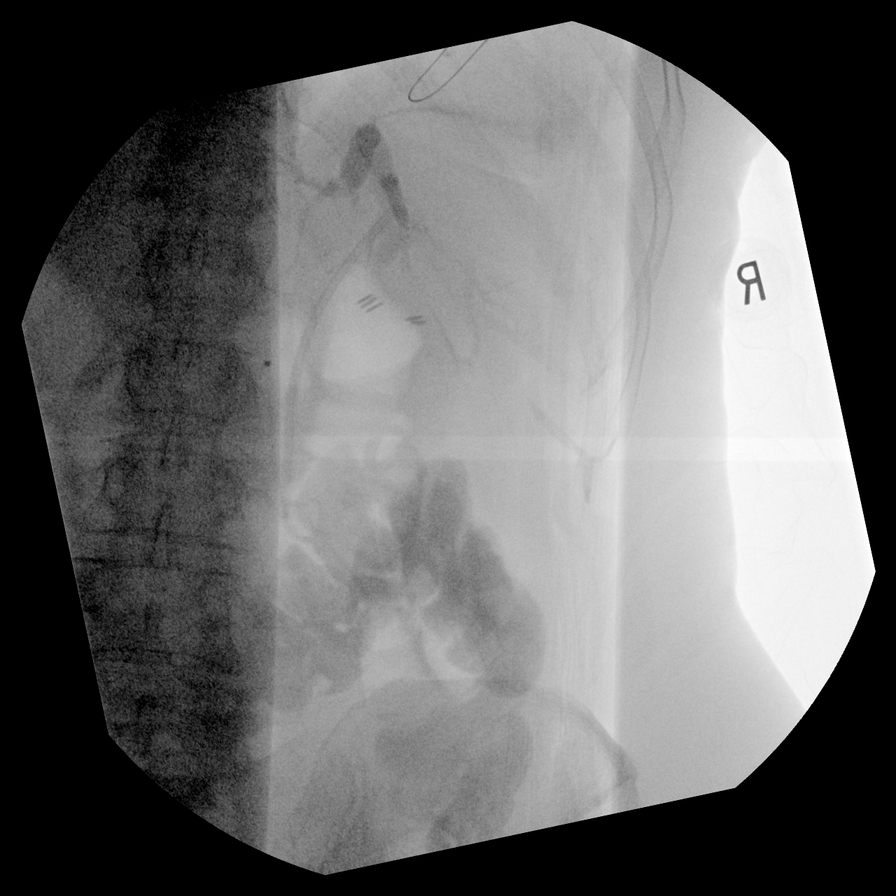

[5 of 5 positions shown; findings below may reference images not displayed]

FINDINGS: Six intraoperative spot images are submitted for review. The images
demonstrate a flexible duodenal scope in the descending duodenum.
Subsequent images demonstrate cannulation of first the pancreatic
duct with placement of a pancreatic stent followed by cannulation of
the common bile duct and placement of a plastic biliary stent.
Cholangiography demonstrates no evidence of biliary duct obstruction
or visible leak.
IMPRESSION: ERCP with placement of plastic pancreatic and biliary duct stents.

These images were submitted for radiologic interpretation only.
Please see the procedural report for the amount of contrast and the
fluoroscopy time utilized.

## 2021-08-04 ENCOUNTER — Encounter: Payer: 59 | Admitting: Family Medicine

## 2021-08-12 ENCOUNTER — Encounter: Payer: Self-pay | Admitting: Family Medicine

## 2021-08-12 ENCOUNTER — Ambulatory Visit (INDEPENDENT_AMBULATORY_CARE_PROVIDER_SITE_OTHER): Payer: Medicare HMO | Admitting: Family Medicine

## 2021-08-12 VITALS — BP 106/68 | HR 70 | Temp 97.7°F | Resp 16 | Ht 65.0 in | Wt 151.4 lb

## 2021-08-12 DIAGNOSIS — E782 Mixed hyperlipidemia: Secondary | ICD-10-CM

## 2021-08-12 DIAGNOSIS — Z0000 Encounter for general adult medical examination without abnormal findings: Secondary | ICD-10-CM

## 2021-08-12 DIAGNOSIS — E559 Vitamin D deficiency, unspecified: Secondary | ICD-10-CM

## 2021-08-12 DIAGNOSIS — E2839 Other primary ovarian failure: Secondary | ICD-10-CM

## 2021-08-12 DIAGNOSIS — M8580 Other specified disorders of bone density and structure, unspecified site: Secondary | ICD-10-CM

## 2021-08-12 DIAGNOSIS — R109 Unspecified abdominal pain: Secondary | ICD-10-CM | POA: Diagnosis not present

## 2021-08-12 DIAGNOSIS — Z780 Asymptomatic menopausal state: Secondary | ICD-10-CM | POA: Diagnosis not present

## 2021-08-12 LAB — CBC WITH DIFFERENTIAL/PLATELET
Basophils Absolute: 0 10*3/uL (ref 0.0–0.1)
Basophils Relative: 0.7 % (ref 0.0–3.0)
Eosinophils Absolute: 0.1 10*3/uL (ref 0.0–0.7)
Eosinophils Relative: 1.5 % (ref 0.0–5.0)
HCT: 39.4 % (ref 36.0–46.0)
Hemoglobin: 12.9 g/dL (ref 12.0–15.0)
Lymphocytes Relative: 41 % (ref 12.0–46.0)
Lymphs Abs: 2 10*3/uL (ref 0.7–4.0)
MCHC: 32.9 g/dL (ref 30.0–36.0)
MCV: 89.2 fl (ref 78.0–100.0)
Monocytes Absolute: 0.5 10*3/uL (ref 0.1–1.0)
Monocytes Relative: 9.9 % (ref 3.0–12.0)
Neutro Abs: 2.3 10*3/uL (ref 1.4–7.7)
Neutrophils Relative %: 46.9 % (ref 43.0–77.0)
Platelets: 312 10*3/uL (ref 150.0–400.0)
RBC: 4.42 Mil/uL (ref 3.87–5.11)
RDW: 13.4 % (ref 11.5–15.5)
WBC: 4.9 10*3/uL (ref 4.0–10.5)

## 2021-08-12 LAB — COMPREHENSIVE METABOLIC PANEL
ALT: 15 U/L (ref 0–35)
AST: 20 U/L (ref 0–37)
Albumin: 4.5 g/dL (ref 3.5–5.2)
Alkaline Phosphatase: 65 U/L (ref 39–117)
BUN: 10 mg/dL (ref 6–23)
CO2: 30 mEq/L (ref 19–32)
Calcium: 9.7 mg/dL (ref 8.4–10.5)
Chloride: 102 mEq/L (ref 96–112)
Creatinine, Ser: 0.84 mg/dL (ref 0.40–1.20)
GFR: 71.42 mL/min (ref >60.00)
Glucose, Bld: 82 mg/dL (ref 70–99)
Potassium: 4.5 mEq/L (ref 3.5–5.1)
Sodium: 138 mEq/L (ref 135–145)
Total Bilirubin: 0.7 mg/dL (ref 0.2–1.2)
Total Protein: 7.4 g/dL (ref 6.0–8.3)

## 2021-08-12 LAB — LIPID PANEL
Cholesterol: 237 mg/dL — ABNORMAL HIGH (ref 0–200)
HDL: 63.6 mg/dL (ref >39.00)
LDL Cholesterol: 152 mg/dL — ABNORMAL HIGH (ref 0–99)
NonHDL: 172.97
Total CHOL/HDL Ratio: 4
Triglycerides: 106 mg/dL (ref 0.0–149.0)
VLDL: 21.2 mg/dL (ref 0.0–40.0)

## 2021-08-12 LAB — TSH: TSH: 1.23 u[IU]/mL (ref 0.35–5.50)

## 2021-08-12 NOTE — Assessment & Plan Note (Signed)
S/p cholecystectomy and had complications with a bile leak and had to have 2 extra surgeries before she recovered. She still notes getting cramping with high fat foods, with dietary changes she is better.

## 2021-08-12 NOTE — Assessment & Plan Note (Addendum)
Patient encouraged to maintain heart healthy diet, regular exercise, adequate sleep. Consider daily probiotics. Take medications as prescribed. Labs ordered and reviewed. She declines all vaccinations. She had MGM this year and Dexa in 2019 Dexa ordered in spring 2023 at her request. Had colonoscopy in 2022 repeat in 5 years.

## 2021-08-12 NOTE — Assessment & Plan Note (Signed)
Encouraged to get adequate exercise, calcium and vitamin d intake 

## 2021-08-12 NOTE — Assessment & Plan Note (Addendum)
Supplement and monitor 

## 2021-08-12 NOTE — Progress Notes (Signed)
Subjective:    Patient ID: Susan Chang, female    DOB: [birth year], 70 y.o.   MRN: [id number]  No chief complaint on file.   HPI Patient is in today for annual preventative exam. Overall she is doing well but she took months oto recover from her cholecystectomy after complications required 2 more surgeries. She notes she has to avoid high fat foods but as long as she does so she has no trouble with cramping. Denies CP/palp/SOB/HA/congestion/fevers/GI or GU c/o. Taking meds as prescribed   Past Medical History:  Diagnosis Date   Breast cancer ([place]) 2000   right   Osteopenia    Preventative health care 07/13/2016   Vitamin D deficiency 11/14/2015    Past Surgical History:  Procedure Laterality Date   ABDOMINAL HYSTERECTOMY  09/05/1999   TAH b/l SPO for ovarian cyst,   APPENDECTOMY     BILIARY STENT PLACEMENT  02/04/2021   Procedure: BILIARY STENT PLACEMENT;  Surgeon: Arta Silence, MD;  Location: [facility];  Service: Endoscopy;;   BREAST BIOPSY Right 09/04/1998   BREAST LUMPECTOMY Right 2000   CHOLECYSTECTOMY N/A 01/04/2021   Procedure: LAPAROSCOPIC CHOLECYSTECTOMY WITH INTRAOPERATIVE CHOLANGIOGRAM;  Surgeon: Donnie Mesa, MD;  Location: [facility];  Service: General;  Laterality: N/A;   ERCP N/A 02/04/2021   Procedure: ENDOSCOPIC RETROGRADE CHOLANGIOPANCREATOGRAPHY (ERCP);  Surgeon: Arta Silence, MD;  Location: [facility] ENDOSCOPY;  Service: Endoscopy;  Laterality: N/A;   ERCP Bilateral 03/31/2021   Procedure: ENDOSCOPIC RETROGRADE CHOLANGIOPANCREATOGRAPHY (ERCP);  Surgeon: Arta Silence, MD;  Location: [facility] ENDOSCOPY;  Service: Endoscopy;  Laterality: Bilateral;   LYMPH NODE DISSECTION Right 09/04/1998   PANCREATIC STENT PLACEMENT  02/04/2021   Procedure: PANCREATIC STENT PLACEMENT;  Surgeon: Arta Silence, MD;  Location: [facility] ENDOSCOPY;  Service: Endoscopy;;   REMOVAL OF STONES  03/31/2021   Procedure: REMOVAL OF STONES;  Surgeon: Arta Silence, MD;  Location: [facility]  ENDOSCOPY;  Service: Endoscopy;;   SPHINCTEROTOMY  02/04/2021   Procedure: Joan Mayans;  Surgeon: Arta Silence, MD;  Location: [facility] ENDOSCOPY;  Service: Endoscopy;;  Needle Knife   STENT REMOVAL  03/31/2021   Procedure: STENT REMOVAL;  Surgeon: Arta Silence, MD;  Location: [facility] ENDOSCOPY;  Service: Endoscopy;;    Family History  Problem Relation Age of Onset   Breast cancer Mother 44   Cancer Mother        Breast cancer   Hypertension Mother    Stroke Mother    Cancer Father        lung cancer   Cancer Brother    Cancer Paternal Aunt        breast cancer    Social History   Socioeconomic History   Marital status: Married    Spouse name: Not on file   Number of children: Not on file   Years of education: Not on file   Highest education level: Not on file  Occupational History   Occupation: Production manager  Tobacco Use   Smoking status: Never   Smokeless tobacco: Never  Vaping Use   Vaping Use: Never used  Substance and Sexual Activity   Alcohol use: Not Currently    Alcohol/week: 0.0 standard drinks   Drug use: No   Sexual activity: Yes    Comment: lives with husband, drafting and design, no dietary restrictions  Other Topics Concern   Not on file  Social History Narrative   No major dietary restrictions but eats very little meat   Stays very physically active. 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     triamcinolone cream (KENALOG) 0.1 % Apply 1 application topically 2 (two)  times daily as needed. 80 g 0   Vitamin D, Ergocalciferol, (DRISDOL) 1.25 MG (50000 UNIT) CAPS capsule TAKE 1 CAPSULE BY MOUTH EVERY 7 DAYS 4 capsule 4   No facility-administered medications prior to visit.    Allergies  Allergen Reactions   Wound Dressing Adhesive Other (See Comments)    Clear plastic tape caused rash    Review of Systems  Constitutional:  Negative for fever.  HENT:  Negative for congestion.   Eyes:  Negative for blurred vision.  Respiratory:  Negative for cough.   Cardiovascular:  Negative for chest pain and palpitations.  Gastrointestinal:  Negative for vomiting.  Musculoskeletal:  Negative for back pain.  Skin:  Negative for rash.  Neurological:  Negative for loss of consciousness and headaches.      Objective:    Physical Exam Constitutional:      General: She is not in acute distress.    Appearance: She is well-developed.  HENT:     Head: Normocephalic and atraumatic.  Eyes:     Conjunctiva/sclera: Conjunctivae normal.  Neck:     Thyroid: No thyromegaly.  Cardiovascular:     Rate and Rhythm: Normal rate and regular rhythm.     Heart sounds: Normal heart sounds. No murmur heard. Pulmonary:     Effort: Pulmonary effort is normal. No respiratory distress.     Breath sounds: Normal breath sounds.  Abdominal:     General: Bowel sounds are normal. There is no distension.     Palpations: Abdomen is soft. There is no mass.     Tenderness: There is no abdominal tenderness.  Musculoskeletal:     Cervical back: Neck supple.  Lymphadenopathy:     Cervical: No cervical adenopathy.  Skin:    General: Skin is warm and dry.  Neurological:     Mental Status: She is alert and oriented to person, place, and time.  Psychiatric:        Behavior: Behavior normal.    BP 106/68   Pulse 70   Temp 97.7 F (36.5 C)   Resp 16   Ht 5\' 5"  (1.651 m)   Wt 151 lb 6.4 oz (68.7 kg)   SpO2 98%   BMI 25.19 kg/m  Wt Readings from Last 3 Encounters:  08/12/21 151  lb 6.4 oz (68.7 kg)  04/15/21 143 lb (64.9 kg)  03/31/21 143 lb (64.9 kg)    Diabetic Foot Exam - Simple   No data filed    Lab Results  Component Value Date   WBC 6.9 04/15/2021   HGB 12.5 04/15/2021   HCT 38.4 04/15/2021   PLT 321 04/15/2021   GLUCOSE 92 04/15/2021   CHOL 226 (H) 08/02/2020   TRIG 81.0 08/02/2020   HDL 68.70 08/02/2020   LDLCALC 141 (H) 08/02/2020   ALT 21 04/15/2021   AST 22 04/15/2021  NA 138 04/15/2021   K 4.2 04/15/2021   CL 102 04/15/2021   CREATININE 0.81 04/15/2021   BUN 12 04/15/2021   CO2 29 04/15/2021   TSH 1.12 08/02/2020    Lab Results  Component Value Date   TSH 1.12 08/02/2020   Lab Results  Component Value Date   WBC 6.9 04/15/2021   HGB 12.5 04/15/2021   HCT 38.4 04/15/2021   MCV 90.6 04/15/2021   PLT 321 04/15/2021   Lab Results  Component Value Date   NA 138 04/15/2021   K 4.2 04/15/2021   CO2 29 04/15/2021   GLUCOSE 92 04/15/2021   BUN 12 04/15/2021   CREATININE 0.81 04/15/2021   BILITOT 0.6 04/15/2021   ALKPHOS 346 (H) 02/17/2021   AST 22 04/15/2021   ALT 21 04/15/2021   PROT 7.4 04/15/2021   ALBUMIN 4.3 02/17/2021   CALCIUM 9.9 04/15/2021   ANIONGAP 11 02/05/2021   GFR 80.80 02/17/2021   Lab Results  Component Value Date   CHOL 226 (H) 08/02/2020   Lab Results  Component Value Date   HDL 68.70 08/02/2020   Lab Results  Component Value Date   LDLCALC 141 (H) 08/02/2020   Lab Results  Component Value Date   TRIG 81.0 08/02/2020   Lab Results  Component Value Date   CHOLHDL 3 08/02/2020   No results found for: HGBA1C     Assessment & Plan:   Problem List Items Addressed This Visit     Osteopenia - Primary    Encouraged to get adequate exercise, calcium and vitamin d intake      Relevant Orders   Vitamin D 1,25 dihydroxy   Vitamin D deficiency    Supplement and monitor      Relevant Orders   Vitamin D 1,25 dihydroxy   Preventative health care    Patient encouraged to maintain  heart healthy diet, regular exercise, adequate sleep. Consider daily probiotics. Take medications as prescribed. Labs ordered and reviewed. She declines all vaccinations. She had MGM this year and Dexa in 2019 Dexa ordered in spring 2023 at her request. Had colonoscopy in 2022 repeat in 5 years.       Relevant Orders   CBC with Differential/Platelet   Comprehensive metabolic panel   Lipid panel   TSH   Hyperlipidemia    Encourage heart healthy diet such as MIND or DASH diet, increase exercise, avoid trans fats, simple carbohydrates and processed foods, consider a krill or fish or flaxseed oil cap daily.       Relevant Orders   CBC with Differential/Platelet   Comprehensive metabolic panel   Lipid panel   TSH   Abdominal pain    S/p cholecystectomy and had complications with a bile leak and had to have 2 extra surgeries before she recovered. She still notes getting cramping with high fat foods, with dietary changes she is better.       Other Visit Diagnoses     Estrogen deficiency       Relevant Orders   DG Bone Density   Post-menopausal       Relevant Orders   DG Bone Density       I am having Olivia Mackie maintain her Vitamin D (Ergocalciferol), acetaminophen, and triamcinolone cream.  No orders of the defined types were placed in this encounter.    Penni Homans, MD

## 2021-08-12 NOTE — Assessment & Plan Note (Signed)
Encourage heart healthy diet such as MIND or DASH diet, increase exercise, avoid trans fats, simple carbohydrates and processed foods, consider a krill or fish or flaxseed oil cap daily.  °

## 2021-08-12 NOTE — Patient Instructions (Signed)
Preventive Care 40 Years and Older, Female Preventive care refers to lifestyle choices and visits with your health care provider that can promote health and wellness. Preventive care visits are also called wellness exams. What can I expect for my preventive care visit? Counseling Your health care provider may ask you questions about your: Medical history, including: Past medical problems. Family medical history. Pregnancy and menstrual history. History of falls. Current health, including: Memory and ability to understand (cognition). Emotional well-being. Home life and relationship well-being. Sexual activity and sexual health. Lifestyle, including: Alcohol, nicotine or tobacco, and drug use. Access to firearms. Diet, exercise, and sleep habits. Work and work Statistician. Sunscreen use. Safety issues such as seatbelt and bike helmet use. Physical exam Your health care provider will check your: Height and weight. These may be used to calculate your BMI (body mass index). BMI is a measurement that tells if you are at a healthy weight. Waist circumference. This measures the distance around your waistline. This measurement also tells if you are at a healthy weight and may help predict your risk of certain diseases, such as type 2 diabetes and high blood pressure. Heart rate and blood pressure. Body temperature. Skin for abnormal spots. What immunizations do I need? Vaccines are usually given at various ages, according to a schedule. Your health care provider will recommend vaccines for you based on your age, medical history, and lifestyle or other factors, such as travel or where you work. What tests do I need? Screening Your health care provider may recommend screening tests for certain conditions. This may include: Lipid and cholesterol levels. Hepatitis C test. Hepatitis B test. HIV (human immunodeficiency virus) test. STI (sexually transmitted infection) testing, if you are at  risk. Lung cancer screening. Colorectal cancer screening. Diabetes screening. This is done by checking your blood sugar (glucose) after you have not eaten for a while (fasting). Mammogram. Talk with your health care provider about how often you should have regular mammograms. BRCA-related cancer screening. This may be done if you have a family history of breast, ovarian, tubal, or peritoneal cancers. Bone density scan. This is done to screen for osteoporosis. Talk with your health care provider about your test results, treatment options, and if necessary, the need for more tests. Follow these instructions at home: Eating and drinking  Eat a diet that includes fresh fruits and vegetables, whole grains, lean protein, and low-fat dairy products. Limit your intake of foods with high amounts of sugar, saturated fats, and salt. Take vitamin and mineral supplements as recommended by your health care provider. Do not drink alcohol if your health care provider tells you not to drink. If you drink alcohol: Limit how much you have to 0-1 drink a day. Know how much alcohol is in your drink. In the [place] one drink equals one 12 oz bottle of beer (355 mL), one 5 oz glass of wine (148 mL), or one 1 oz glass of hard liquor (44 mL). Lifestyle Brush your teeth every morning and night with fluoride toothpaste. Floss one time each day. Exercise for at least 30 minutes 5 or more days each week. Do not use any products that contain nicotine or tobacco. These products include cigarettes, chewing tobacco, and vaping devices, such as e-cigarettes. If you need help quitting, ask your health care provider. Do not use drugs. If you are sexually active, practice safe sex. Use a condom or other form of protection in order to prevent STIs. Take aspirin only as told by your  health care provider. Make sure that you understand how much to take and what form to take. Work with your health care provider to find out whether it  is safe and beneficial for you to take aspirin daily. Ask your health care provider if you need to take a cholesterol-lowering medicine (statin). Find healthy ways to manage stress, such as: Meditation, yoga, or listening to music. Journaling. Talking to a trusted person. Spending time with friends and family. Minimize exposure to UV radiation to reduce your risk of skin cancer. Safety Always wear your seat belt while driving or riding in a vehicle. Do not drive: If you have been drinking alcohol. Do not ride with someone who has been drinking. When you are tired or distracted. While texting. If you have been using any mind-altering substances or drugs. Wear a helmet and other protective equipment during sports activities. If you have firearms in your house, make sure you follow all gun safety procedures. What's next? Visit your health care provider once a year for an annual wellness visit. Ask your health care provider how often you should have your eyes and teeth checked. Stay up to date on all vaccines. This information is not intended to replace advice given to you by your health care provider. Make sure you discuss any questions you have with your health care provider. Document Revised: 02/16/2021 Document Reviewed: 02/16/2021 Elsevier Patient Education  [place].

## 2021-08-17 LAB — VITAMIN D 1,25 DIHYDROXY
Vitamin D 1, 25 (OH)2 Total: 48 pg/mL (ref 18–72)
Vitamin D2 1, 25 (OH)2: 24 pg/mL
Vitamin D3 1, 25 (OH)2: 24 pg/mL

## 2022-02-01 NOTE — Progress Notes (Unsigned)
Subjective:    Patient ID: Susan Chang, female    DOB: 1950/09/06, 71 y.o.   MRN: [id number]  No chief complaint on file.   HPI Patient is in today for a follow up.  Past Medical History:  Diagnosis Date   Breast cancer ([place]) 2000   right   Osteopenia    Preventative health care 07/13/2016   Vitamin D deficiency 11/14/2015    Past Surgical History:  Procedure Laterality Date   ABDOMINAL HYSTERECTOMY  09/05/1999   TAH b/l SPO for ovarian cyst,   APPENDECTOMY     BILIARY STENT PLACEMENT  02/04/2021   Procedure: BILIARY STENT PLACEMENT;  Surgeon: Arta Silence, MD;  Location: [facility];  Service: Endoscopy;;   BREAST BIOPSY Right 09/04/1998   BREAST LUMPECTOMY Right 2000   CHOLECYSTECTOMY N/A 01/04/2021   Procedure: LAPAROSCOPIC CHOLECYSTECTOMY WITH INTRAOPERATIVE CHOLANGIOGRAM;  Surgeon: Donnie Mesa, MD;  Location: [facility];  Service: General;  Laterality: N/A;   ERCP N/A 02/04/2021   Procedure: ENDOSCOPIC RETROGRADE CHOLANGIOPANCREATOGRAPHY (ERCP);  Surgeon: Arta Silence, MD;  Location: [facility] ENDOSCOPY;  Service: Endoscopy;  Laterality: N/A;   ERCP Bilateral 03/31/2021   Procedure: ENDOSCOPIC RETROGRADE CHOLANGIOPANCREATOGRAPHY (ERCP);  Surgeon: Arta Silence, MD;  Location: [facility] ENDOSCOPY;  Service: Endoscopy;  Laterality: Bilateral;   LYMPH NODE DISSECTION Right 09/04/1998   PANCREATIC STENT PLACEMENT  02/04/2021   Procedure: PANCREATIC STENT PLACEMENT;  Surgeon: Arta Silence, MD;  Location: [facility] ENDOSCOPY;  Service: Endoscopy;;   REMOVAL OF STONES  03/31/2021   Procedure: REMOVAL OF STONES;  Surgeon: Arta Silence, MD;  Location: [facility] ENDOSCOPY;  Service: Endoscopy;;   SPHINCTEROTOMY  02/04/2021   Procedure: Joan Mayans;  Surgeon: Arta Silence, MD;  Location: [facility] ENDOSCOPY;  Service: Endoscopy;;  Needle Knife   STENT REMOVAL  03/31/2021   Procedure: STENT REMOVAL;  Surgeon: Arta Silence, MD;  Location: [facility] ENDOSCOPY;  Service: Endoscopy;;    Family  History  Problem Relation Age of Onset   Breast cancer Mother 10   Cancer Mother        Breast cancer   Hypertension Mother    Stroke Mother    Cancer Father        lung cancer   Cancer Brother    Cancer Paternal Aunt        breast cancer    Social History   Socioeconomic History   Marital status: Married    Spouse name: Not on file   Number of children: Not on file   Years of education: Not on file   Highest education level: Not on file  Occupational History   Occupation: Production manager  Tobacco Use   Smoking status: Never   Smokeless tobacco: Never  Vaping Use   Vaping Use: Never used  Substance and Sexual Activity   Alcohol use: Not Currently    Alcohol/week: 0.0 standard drinks   Drug use: No   Sexual activity: Yes    Comment: lives with husband, drafting and design, no dietary restrictions  Other Topics Concern   Not on file  Social History Narrative   No major dietary restrictions but eats very little meat   Stays very physically active. Walks daily   Works Ecologist of Radio broadcast assistant Strain: Not on file  Food Insecurity: Not on file  Transportation Needs: Not on file  Physical Activity: Not on file  Stress: Not on file  Social Connections: Not on file  Intimate Partner Violence: Not on  file    Outpatient Medications Prior to Visit  Medication Sig Dispense Refill   acetaminophen (TYLENOL) 325 MG tablet Take 2 tablets (650 mg total) by mouth every 6 (six) hours as needed for mild pain or moderate pain.     triamcinolone cream (KENALOG) 0.1 % Apply 1 application topically 2 (two) times daily as needed. 80 g 0   Vitamin D, Ergocalciferol, (DRISDOL) 1.25 MG (50000 UNIT) CAPS capsule TAKE 1 CAPSULE BY MOUTH EVERY 7 DAYS 4 capsule 4   No facility-administered medications prior to visit.    Allergies  Allergen Reactions   Wound Dressing Adhesive Other (See Comments)    Clear plastic tape caused rash     ROS     Objective:    Physical Exam  There were no vitals taken for this visit. Wt Readings from Last 3 Encounters:  08/12/21 151 lb 6.4 oz (68.7 kg)  04/15/21 143 lb (64.9 kg)  03/31/21 143 lb (64.9 kg)    Diabetic Foot Exam - Simple   No data filed    Lab Results  Component Value Date   WBC 4.9 08/12/2021   HGB 12.9 08/12/2021   HCT 39.4 08/12/2021   PLT 312.0 08/12/2021   GLUCOSE 82 08/12/2021   CHOL 237 (H) 08/12/2021   TRIG 106.0 08/12/2021   HDL 63.60 08/12/2021   LDLCALC 152 (H) 08/12/2021   ALT 15 08/12/2021   AST 20 08/12/2021   NA 138 08/12/2021   K 4.5 08/12/2021   CL 102 08/12/2021   CREATININE 0.84 08/12/2021   BUN 10 08/12/2021   CO2 30 08/12/2021   TSH 1.23 08/12/2021    Lab Results  Component Value Date   TSH 1.23 08/12/2021   Lab Results  Component Value Date   WBC 4.9 08/12/2021   HGB 12.9 08/12/2021   HCT 39.4 08/12/2021   MCV 89.2 08/12/2021   PLT 312.0 08/12/2021   Lab Results  Component Value Date   NA 138 08/12/2021   K 4.5 08/12/2021   CO2 30 08/12/2021   GLUCOSE 82 08/12/2021   BUN 10 08/12/2021   CREATININE 0.84 08/12/2021   BILITOT 0.7 08/12/2021   ALKPHOS 65 08/12/2021   AST 20 08/12/2021   ALT 15 08/12/2021   PROT 7.4 08/12/2021   ALBUMIN 4.5 08/12/2021   CALCIUM 9.7 08/12/2021   ANIONGAP 11 02/05/2021   GFR 71.42 08/12/2021   Lab Results  Component Value Date   CHOL 237 (H) 08/12/2021   Lab Results  Component Value Date   HDL 63.60 08/12/2021   Lab Results  Component Value Date   LDLCALC 152 (H) 08/12/2021   Lab Results  Component Value Date   TRIG 106.0 08/12/2021   Lab Results  Component Value Date   CHOLHDL 4 08/12/2021   No results found for: HGBA1C     Assessment & Plan:   Problem List Items Addressed This Visit   None   I am having Susan Chang maintain her Vitamin D (Ergocalciferol), acetaminophen, and triamcinolone cream.  No orders of the defined types were placed  in this encounter.

## 2022-02-02 ENCOUNTER — Encounter: Payer: Self-pay | Admitting: Family Medicine

## 2022-02-02 ENCOUNTER — Ambulatory Visit (INDEPENDENT_AMBULATORY_CARE_PROVIDER_SITE_OTHER): Payer: Medicare HMO | Admitting: Family Medicine

## 2022-02-02 VITALS — BP 124/82 | HR 74 | Resp 20 | Ht 65.0 in | Wt 158.2 lb

## 2022-02-02 DIAGNOSIS — E559 Vitamin D deficiency, unspecified: Secondary | ICD-10-CM

## 2022-02-02 DIAGNOSIS — M8580 Other specified disorders of bone density and structure, unspecified site: Secondary | ICD-10-CM | POA: Diagnosis not present

## 2022-02-02 DIAGNOSIS — Z780 Asymptomatic menopausal state: Secondary | ICD-10-CM

## 2022-02-02 DIAGNOSIS — R109 Unspecified abdominal pain: Secondary | ICD-10-CM

## 2022-02-02 DIAGNOSIS — Z1239 Encounter for other screening for malignant neoplasm of breast: Secondary | ICD-10-CM

## 2022-02-02 DIAGNOSIS — E2839 Other primary ovarian failure: Secondary | ICD-10-CM

## 2022-02-02 DIAGNOSIS — E782 Mixed hyperlipidemia: Secondary | ICD-10-CM

## 2022-02-02 LAB — COMPREHENSIVE METABOLIC PANEL
ALT: 15 U/L (ref 0–35)
AST: 19 U/L (ref 0–37)
Albumin: 4.6 g/dL (ref 3.5–5.2)
Alkaline Phosphatase: 55 U/L (ref 39–117)
BUN: 12 mg/dL (ref 6–23)
CO2: 28 mEq/L (ref 19–32)
Calcium: 9.9 mg/dL (ref 8.4–10.5)
Chloride: 103 mEq/L (ref 96–112)
Creatinine, Ser: 0.94 mg/dL (ref 0.40–1.20)
GFR: 62.19 mL/min (ref >60.00)
Glucose, Bld: 86 mg/dL (ref 70–99)
Potassium: 4.4 mEq/L (ref 3.5–5.1)
Sodium: 140 mEq/L (ref 135–145)
Total Bilirubin: 0.6 mg/dL (ref 0.2–1.2)
Total Protein: 7.4 g/dL (ref 6.0–8.3)

## 2022-02-02 LAB — TSH: TSH: 1.18 u[IU]/mL (ref 0.35–5.50)

## 2022-02-02 LAB — CBC
HCT: 38.6 % (ref 36.0–46.0)
Hemoglobin: 13.1 g/dL (ref 12.0–15.0)
MCHC: 33.9 g/dL (ref 30.0–36.0)
MCV: 89.1 fl (ref 78.0–100.0)
Platelets: 307 10*3/uL (ref 150.0–400.0)
RBC: 4.33 Mil/uL (ref 3.87–5.11)
RDW: 13.4 % (ref 11.5–15.5)
WBC: 5.7 10*3/uL (ref 4.0–10.5)

## 2022-02-02 LAB — VITAMIN D 25 HYDROXY (VIT D DEFICIENCY, FRACTURES): VITD: 19.27 ng/mL — ABNORMAL LOW (ref 30.00–100.00)

## 2022-02-02 LAB — LIPID PANEL
Cholesterol: 245 mg/dL — ABNORMAL HIGH (ref 0–200)
HDL: 66 mg/dL (ref >39.00)
LDL Cholesterol: 159 mg/dL — ABNORMAL HIGH (ref 0–99)
NonHDL: 178.69
Total CHOL/HDL Ratio: 4
Triglycerides: 98 mg/dL (ref 0.0–149.0)
VLDL: 19.6 mg/dL (ref 0.0–40.0)

## 2022-02-02 NOTE — Assessment & Plan Note (Signed)
Supplement and monitor 

## 2022-02-02 NOTE — Patient Instructions (Signed)
Osteopenia  Osteopenia is a loss of thickness (density) inside the bones. Another name for osteopenia is low bone mass. Mild osteopenia is a normal part of aging. It is not a disease, and it does not cause symptoms. However, if you have osteopenia and continue to lose bone mass, you could develop a condition that causes the bones to become thin and break more easily (osteoporosis). Osteoporosis can cause you to lose some height, have back pain, and have a stooped posture. Although osteopenia is not a disease, making changes to your lifestyle and diet can help to prevent osteopenia from developing into osteoporosis. What are the causes? Osteopenia is caused by loss of calcium in the bones. Bones are constantly changing. Old bone cells are continually being replaced with new bone cells. This process builds new bone. The mineral calcium is needed to build new bone and maintain bone density. Bone density is usually highest around age 35. After that, most people's bodies cannot replace all the bone they have lost with new bone. What increases the risk? You are more likely to develop this condition if: You are older than age 50. You are a woman who went through menopause early. You have a long illness that keeps you in bed. You do not get enough exercise. You lack certain nutrients (malnutrition). You have an overactive thyroid gland (hyperthyroidism). You use products that contain nicotine or tobacco, such as cigarettes, e-cigarettes and chewing tobacco, or you drink a lot of alcohol. You are taking medicines that weaken the bones, such as steroids. What are the signs or symptoms? This condition does not cause any symptoms. You may have a slightly higher risk for bone breaks (fractures), so getting fractures more easily than normal may be an indication of osteopenia. How is this diagnosed? This condition may be diagnosed based on an X-ray exam that measures bone density (dual-energy X-ray  absorptiometry, or DEXA). This test can measure bone density in your hips, spine, and wrists. Osteopenia has no symptoms, so this condition is usually diagnosed after a routine bone density screening test is done for osteoporosis. This routine screening is usually done for: Women who are age 65 or older. Men who are age 70 or older. If you have risk factors for osteopenia, you may have the screening test at an earlier age. How is this treated? Making dietary and lifestyle changes can lower your risk for osteoporosis. If you have severe osteopenia that is close to becoming osteoporosis, this condition can be treated with medicines and dietary supplements such as calcium and vitamin D. These supplements help to rebuild bone density. Follow these instructions at home: Eating and drinking Eat a diet that is high in calcium and vitamin D. Calcium is found in dairy products, beans, salmon, and leafy green vegetables like spinach and broccoli. Look for foods that have vitamin D and calcium added to them (fortified foods), such as orange juice, cereal, and bread.  Lifestyle Do 30 minutes or more of a weight-bearing exercise every day, such as walking, jogging, or playing a sport. These types of exercises strengthen the bones. Do not use any products that contain nicotine or tobacco, such as cigarettes, e-cigarettes, and chewing tobacco. If you need help quitting, ask your health care provider. Do not drink alcohol if: Your health care provider tells you not to drink. You are pregnant, may be pregnant, or are planning to become pregnant. If you drink alcohol: Limit how much you use to: 0-1 drink a day for women. 0-2   drinks a day for men. Be aware of how much alcohol is in your drink. In the [place] one drink equals one 12 oz bottle of beer (355 mL), one 5 oz glass of wine (148 mL), or one 1 oz glass of hard liquor (44 mL). General instructions Take over-the-counter and prescription medicines only as  told by your health care provider. These include vitamins and supplements. Take precautions at home to lower your risk of falling, such as: Keeping rooms well-lit and free of clutter, such as cords. Installing safety rails on stairs. Using rubber mats in the bathroom or other areas that are often wet or slippery. Keep all follow-up visits. This is important. Contact a health care provider if: You have not had a bone density screening for osteoporosis and you are: A woman who is age 65 or older. A man who is age 70 or older. You are a postmenopausal woman who has not had a bone density screening for osteoporosis. You are older than age 50 and you want to know if you should have bone density screening for osteoporosis. Summary Osteopenia is a loss of thickness (density) inside the bones. Another name for osteopenia is low bone mass. Osteopenia is not a disease, but it may increase your risk for a condition that causes the bones to become thin and break more easily (osteoporosis). You may be at risk for osteopenia if you are older than age 50 or if you are a woman who went through early menopause. Osteopenia does not cause any symptoms, but it can be diagnosed with a bone density screening test. Dietary and lifestyle changes are the first treatment for osteopenia. These may lower your risk for osteoporosis. This information is not intended to replace advice given to you by your health care provider. Make sure you discuss any questions you have with your health care provider. Document Revised: 02/05/2020 Document Reviewed: 02/05/2020 Elsevier Patient Education  2023 [employer]  

## 2022-02-02 NOTE — Assessment & Plan Note (Signed)
Has largely resolved and she is eating better.

## 2022-02-02 NOTE — Assessment & Plan Note (Signed)
Encouraged to get adequate exercise, calcium and vitamin d intake 

## 2022-02-02 NOTE — Assessment & Plan Note (Signed)
Encourage heart healthy diet such as MIND or DASH diet, increase exercise, avoid trans fats, simple carbohydrates and processed foods, consider a krill or fish or flaxseed oil cap daily.  °

## 2022-02-03 MED ORDER — VITAMIN D (ERGOCALCIFEROL) 1.25 MG (50000 UNIT) PO CAPS: 50000.0000 [IU] | ORAL_CAPSULE | ORAL | 4 refills | Status: DC

## 2022-02-03 NOTE — Addendum Note (Signed)
Addended by: Lynnea Ferrier R on: 02/03/2022 08:40 AM   Modules accepted: Orders

## 2022-06-05 ENCOUNTER — Ambulatory Visit (HOSPITAL_BASED_OUTPATIENT_CLINIC_OR_DEPARTMENT_OTHER)
Admission: RE | Admit: 2022-06-05 | Discharge: 2022-06-05 | Disposition: A | Discharge status: Home / Self Care | Payer: Medicare HMO | Source: Ambulatory Visit | Attending: Family Medicine | Admitting: Family Medicine

## 2022-06-05 ENCOUNTER — Encounter (HOSPITAL_BASED_OUTPATIENT_CLINIC_OR_DEPARTMENT_OTHER): Payer: Self-pay

## 2022-06-05 DIAGNOSIS — M85851 Other specified disorders of bone density and structure, right thigh: Secondary | ICD-10-CM | POA: Diagnosis not present

## 2022-06-05 DIAGNOSIS — Z853 Personal history of malignant neoplasm of breast: Secondary | ICD-10-CM | POA: Diagnosis not present

## 2022-06-05 DIAGNOSIS — Z13820 Encounter for screening for osteoporosis: Secondary | ICD-10-CM | POA: Insufficient documentation

## 2022-06-05 DIAGNOSIS — E2839 Other primary ovarian failure: Secondary | ICD-10-CM | POA: Diagnosis not present

## 2022-06-05 DIAGNOSIS — Z780 Asymptomatic menopausal state: Secondary | ICD-10-CM | POA: Diagnosis not present

## 2022-06-05 DIAGNOSIS — M8589 Other specified disorders of bone density and structure, multiple sites: Secondary | ICD-10-CM | POA: Diagnosis not present

## 2022-06-05 DIAGNOSIS — Z1239 Encounter for other screening for malignant neoplasm of breast: Secondary | ICD-10-CM | POA: Insufficient documentation

## 2022-06-05 DIAGNOSIS — M8580 Other specified disorders of bone density and structure, unspecified site: Secondary | ICD-10-CM | POA: Insufficient documentation

## 2022-06-05 DIAGNOSIS — Z1231 Encounter for screening mammogram for malignant neoplasm of breast: Secondary | ICD-10-CM | POA: Insufficient documentation

## 2022-06-12 ENCOUNTER — Encounter: Payer: Self-pay | Admitting: Family Medicine

## 2022-06-20 ENCOUNTER — Telehealth: Payer: Self-pay | Admitting: Family Medicine

## 2022-06-20 NOTE — Telephone Encounter (Signed)
Patient called back and declined the AWV.

## 2022-06-20 NOTE — Telephone Encounter (Signed)
Left message for patient to call back and schedule Medicare Annual Wellness Visit (AWV).   Please offer to do virtually or by telephone.  Left office number and my jabber #[phone].  AWVI eligible as of 09/04/2021  Please schedule at anytime with Nurse Health Advisor.   

## 2022-08-07 ENCOUNTER — Ambulatory Visit: Payer: Medicare HMO | Admitting: Family Medicine

## 2022-08-16 NOTE — Assessment & Plan Note (Signed)

## 2022-08-16 NOTE — Assessment & Plan Note (Signed)
Encourage heart healthy diet such as MIND or DASH diet, increase exercise, avoid trans fats, simple carbohydrates and processed foods, consider a krill or fish or flaxseed oil cap daily.  °

## 2022-08-16 NOTE — Assessment & Plan Note (Signed)
Patient encouraged to maintain heart healthy diet, regular exercise, adequate sleep. Consider daily probiotics. Take medications as prescribed. Labs ordered and reviewed  Colonoscopy patient declines Pap patient aged out Mgm 06/2022 repeat in 2025 Dexa 06/2022 repeat 2025 to 29

## 2022-08-16 NOTE — Assessment & Plan Note (Addendum)
Supplement and monitor, results say it is still low. Labs reveal deficiency. Start on Vitamin D 50000 IU caps, 1 cap po weekly x 12 weeks. Disp #4 with 4 rf. Also take daily Vitamin D over the counter. If already taking a daily supplement increase by 1000 IU daily and if not start Vitamin D 2000 IU daily.

## 2022-08-17 ENCOUNTER — Ambulatory Visit (INDEPENDENT_AMBULATORY_CARE_PROVIDER_SITE_OTHER): Payer: Medicare HMO | Admitting: Family Medicine

## 2022-08-17 VITALS — BP 118/70 | HR 70 | Temp 97.5°F | Resp 16 | Ht 66.0 in | Wt 160.4 lb

## 2022-08-17 DIAGNOSIS — E782 Mixed hyperlipidemia: Secondary | ICD-10-CM | POA: Diagnosis not present

## 2022-08-17 DIAGNOSIS — Z0000 Encounter for general adult medical examination without abnormal findings: Secondary | ICD-10-CM

## 2022-08-17 DIAGNOSIS — M8580 Other specified disorders of bone density and structure, unspecified site: Secondary | ICD-10-CM

## 2022-08-17 DIAGNOSIS — E559 Vitamin D deficiency, unspecified: Secondary | ICD-10-CM | POA: Diagnosis not present

## 2022-08-17 LAB — CBC WITH DIFFERENTIAL/PLATELET
Basophils Absolute: 0 10*3/uL (ref 0.0–0.1)
Basophils Relative: 0.9 % (ref 0.0–3.0)
Eosinophils Absolute: 0.1 10*3/uL (ref 0.0–0.7)
Eosinophils Relative: 1.2 % (ref 0.0–5.0)
HCT: 39.5 % (ref 36.0–46.0)
Hemoglobin: 13 g/dL (ref 12.0–15.0)
Lymphocytes Relative: 36.8 % (ref 12.0–46.0)
Lymphs Abs: 2.1 10*3/uL (ref 0.7–4.0)
MCHC: 33 g/dL (ref 30.0–36.0)
MCV: 89.5 fl (ref 78.0–100.0)
Monocytes Absolute: 0.6 10*3/uL (ref 0.1–1.0)
Monocytes Relative: 9.5 % (ref 3.0–12.0)
Neutro Abs: 3 10*3/uL (ref 1.4–7.7)
Neutrophils Relative %: 51.6 % (ref 43.0–77.0)
Platelets: 354 10*3/uL (ref 150.0–400.0)
RBC: 4.41 Mil/uL (ref 3.87–5.11)
RDW: 13.7 % (ref 11.5–15.5)
WBC: 5.8 10*3/uL (ref 4.0–10.5)

## 2022-08-17 LAB — LIPID PANEL
Cholesterol: 243 mg/dL — ABNORMAL HIGH (ref 0–200)
HDL: 64.1 mg/dL (ref >39.00)
LDL Cholesterol: 156 mg/dL — ABNORMAL HIGH (ref 0–99)
NonHDL: 178.45
Total CHOL/HDL Ratio: 4
Triglycerides: 113 mg/dL (ref 0.0–149.0)
VLDL: 22.6 mg/dL (ref 0.0–40.0)

## 2022-08-17 LAB — TSH: TSH: 0.79 u[IU]/mL (ref 0.35–5.50)

## 2022-08-17 LAB — COMPREHENSIVE METABOLIC PANEL
ALT: 13 U/L (ref 0–35)
AST: 17 U/L (ref 0–37)
Albumin: 4.6 g/dL (ref 3.5–5.2)
Alkaline Phosphatase: 66 U/L (ref 39–117)
BUN: 15 mg/dL (ref 6–23)
CO2: 32 mEq/L (ref 19–32)
Calcium: 9.8 mg/dL (ref 8.4–10.5)
Chloride: 102 mEq/L (ref 96–112)
Creatinine, Ser: 0.82 mg/dL (ref 0.40–1.20)
GFR: 72.99 mL/min (ref >60.00)
Glucose, Bld: 87 mg/dL (ref 70–99)
Potassium: 4.4 mEq/L (ref 3.5–5.1)
Sodium: 138 mEq/L (ref 135–145)
Total Bilirubin: 0.6 mg/dL (ref 0.2–1.2)
Total Protein: 7.5 g/dL (ref 6.0–8.3)

## 2022-08-17 LAB — VITAMIN D 25 HYDROXY (VIT D DEFICIENCY, FRACTURES): VITD: 24.9 ng/mL — ABNORMAL LOW (ref 30.00–100.00)

## 2022-08-17 NOTE — Patient Instructions (Addendum)
[place] vitamins at [employer] or [employer] Vitamin D3 2000 IU daily   Preventive Care 32 Years and Older, Female Preventive care refers to lifestyle choices and visits with your health care provider that can promote health and wellness. Preventive care visits are also called wellness exams. What can I expect for my preventive care visit? Counseling Your health care provider may ask you questions about your: Medical history, including: Past medical problems. Family medical history. Pregnancy and menstrual history. History of falls. Current health, including: Memory and ability to understand (cognition). Emotional well-being. Home life and relationship well-being. Sexual activity and sexual health. Lifestyle, including: Alcohol, nicotine or tobacco, and drug use. Access to firearms. Diet, exercise, and sleep habits. Work and work Statistician. Sunscreen use. Safety issues such as seatbelt and bike helmet use. Physical exam Your health care provider will check your: Height and weight. These may be used to calculate your BMI (body mass index). BMI is a measurement that tells if you are at a healthy weight. Waist circumference. This measures the distance around your waistline. This measurement also tells if you are at a healthy weight and may help predict your risk of certain diseases, such as type 2 diabetes and high blood pressure. Heart rate and blood pressure. Body temperature. Skin for abnormal spots. What immunizations do I need?  Vaccines are usually given at various ages, according to a schedule. Your health care provider will recommend vaccines for you based on your age, medical history, and lifestyle or other factors, such as travel or where you work. What tests do I need? Screening Your health care provider may recommend screening tests for certain conditions. This may include: Lipid and cholesterol levels. Hepatitis C test. Hepatitis B test. HIV (human immunodeficiency  virus) test. STI (sexually transmitted infection) testing, if you are at risk. Lung cancer screening. Colorectal cancer screening. Diabetes screening. This is done by checking your blood sugar (glucose) after you have not eaten for a while (fasting). Mammogram. Talk with your health care provider about how often you should have regular mammograms. BRCA-related cancer screening. This may be done if you have a family history of breast, ovarian, tubal, or peritoneal cancers. Bone density scan. This is done to screen for osteoporosis. Talk with your health care provider about your test results, treatment options, and if necessary, the need for more tests. Follow these instructions at home: Eating and drinking  Eat a diet that includes fresh fruits and vegetables, whole grains, lean protein, and low-fat dairy products. Limit your intake of foods with high amounts of sugar, saturated fats, and salt. Take vitamin and mineral supplements as recommended by your health care provider. Do not drink alcohol if your health care provider tells you not to drink. If you drink alcohol: Limit how much you have to 0-1 drink a day. Know how much alcohol is in your drink. In the [place] one drink equals one 12 oz bottle of beer (355 mL), one 5 oz glass of wine (148 mL), or one 1 oz glass of hard liquor (44 mL). Lifestyle Brush your teeth every morning and night with fluoride toothpaste. Floss one time each day. Exercise for at least 30 minutes 5 or more days each week. Do not use any products that contain nicotine or tobacco. These products include cigarettes, chewing tobacco, and vaping devices, such as e-cigarettes. If you need help quitting, ask your health care provider. Do not use drugs. If you are sexually active, practice safe sex. Use a condom or other  form of protection in order to prevent STIs. Take aspirin only as told by your health care provider. Make sure that you understand how much to take and what  form to take. Work with your health care provider to find out whether it is safe and beneficial for you to take aspirin daily. Ask your health care provider if you need to take a cholesterol-lowering medicine (statin). Find healthy ways to manage stress, such as: Meditation, yoga, or listening to music. Journaling. Talking to a trusted person. Spending time with friends and family. Minimize exposure to UV radiation to reduce your risk of skin cancer. Safety Always wear your seat belt while driving or riding in a vehicle. Do not drive: If you have been drinking alcohol. Do not ride with someone who has been drinking. When you are tired or distracted. While texting. If you have been using any mind-altering substances or drugs. Wear a helmet and other protective equipment during sports activities. If you have firearms in your house, make sure you follow all gun safety procedures. What's next? Visit your health care provider once a year for an annual wellness visit. Ask your health care provider how often you should have your eyes and teeth checked. Stay up to date on all vaccines. This information is not intended to replace advice given to you by your health care provider. Make sure you discuss any questions you have with your health care provider. Document Revised: 02/16/2021 Document Reviewed: 02/16/2021 Elsevier Patient Education  [place].

## 2022-08-17 NOTE — Progress Notes (Signed)
Subjective:   By signing my name below, I, Kellie Simmering, attest that this documentation has been prepared under the direction and in the presence of Mosie Lukes, MD., 08/17/2022.     Patient ID: Susan Chang, female    DOB: June 10, 1951, 71 y.o.   MRN: [id number]  Chief Complaint  Patient presents with   Annual Exam    Annual Exam   HPI Patient is in today for a comprehensive physical exam and follow up on chronic medical concerns. She denies CP/palpitations/ SOB/HA/congestion/ fevers/GI or GU c/o.  FHx No changes to the family history.  Supplements Patient is prescribed vitamin D 50,000 IU to take once weekly, but has not been taking it consistently. She is interested in checking her vitamin D level today.  Past Medical History:  Diagnosis Date   Breast cancer ([place]) 2000   right   Osteopenia    Preventative health care 07/13/2016   Vitamin D deficiency 11/14/2015   Past Surgical History:  Procedure Laterality Date   ABDOMINAL HYSTERECTOMY  09/05/1999   TAH b/l SPO for ovarian cyst,   APPENDECTOMY     BILIARY STENT PLACEMENT  02/04/2021   Procedure: BILIARY STENT PLACEMENT;  Surgeon: Arta Silence, MD;  Location: [facility];  Service: Endoscopy;;   BREAST BIOPSY Right 09/04/1998   BREAST LUMPECTOMY Right 2000   CHOLECYSTECTOMY N/A 01/04/2021   Procedure: LAPAROSCOPIC CHOLECYSTECTOMY WITH INTRAOPERATIVE CHOLANGIOGRAM;  Surgeon: Donnie Mesa, MD;  Location: [facility];  Service: General;  Laterality: N/A;   ERCP N/A 02/04/2021   Procedure: ENDOSCOPIC RETROGRADE CHOLANGIOPANCREATOGRAPHY (ERCP);  Surgeon: Arta Silence, MD;  Location: [facility];  Service: Endoscopy;  Laterality: N/A;   ERCP Bilateral 03/31/2021   Procedure: ENDOSCOPIC RETROGRADE CHOLANGIOPANCREATOGRAPHY (ERCP);  Surgeon: Arta Silence, MD;  Location: [facility];  Service: Endoscopy;  Laterality: Bilateral;   LYMPH NODE DISSECTION Right 09/04/1998   PANCREATIC STENT PLACEMENT  02/04/2021    Procedure: PANCREATIC STENT PLACEMENT;  Surgeon: Arta Silence, MD;  Location: [facility]OS[facility];  Service: Endoscopy;;   REMOVAL OF STONES  03/31/2021   Procedure: REMOVAL OF STONES;  Surgeon: Arta Silence, MD;  Location: [facility] ENDOSCOPY;  Service: Endoscopy;;   SPHINCTEROTOMY  02/04/2021   Procedure: Joan Mayans;  Surgeon: Arta Silence, MD;  Location: [facility]OSCOPY;  Service: Endoscopy;;  Needle Knife   STENT REMOVAL  03/31/2021   Procedure: STENT REMOVAL;  Surgeon: Arta Silence, MD;  Location: [facility]OS[facility];  Service: Endoscopy;;   Family History  Problem Relation Age of Onset   Breast cancer Mother 18   Cancer Mother        Breast cancer   Hypertension Mother    Stroke Mother    Cancer Father        lung cancer   Cancer Brother    Cancer Paternal Aunt        breast cancer   Social History   Socioeconomic History   Marital status: Married    Spouse name: Not on file   Number of children: Not on file   Years of education: Not on file   Highest education level: Not on file  Occupational History   Occupation: Production manager  Tobacco Use   Smoking status: Never   Smokeless tobacco: Never  Vaping Use   Vaping Use: Never used  Substance and Sexual Activity   Alcohol use: Not Currently    Alcohol/week: 0.0 standard drinks of alcohol   Drug use: No   Sexual activity: Yes    Comment: lives  with husband, drafting and design, no dietary restrictions  Other Topics Concern   Not on file  Social History Narrative   No major dietary restrictions but eats very little meat   Stays very physically active. Walks daily   Works [employer] broadcast assistant Strain: Not on file  Food Insecurity: Not on file  Transportation Needs: Not on file  Physical Activity: Not on file  Stress: Not on file  Social Connections: Not on file  Intimate Partner Violence: Not on file   Outpatient Medications Prior to Visit  Medication Sig  Dispense Refill   acetaminophen (TYLENOL) 325 MG tablet Take 2 tablets (650 mg total) by mouth every 6 (six) hours as needed for mild pain or moderate pain.     triamcinolone cream (KENALOG) 0.1 % Apply 1 application topically 2 (two) times daily as needed. 80 g 0   Vitamin D, Ergocalciferol, (DRISDOL) 1.25 MG (50000 UNIT) CAPS capsule Take 1 capsule (50,000 Units total) by mouth every 7 (seven) days. 4 capsule 4   No facility-administered medications prior to visit.   Allergies  Allergen Reactions   Pneumococcal Vaccine Other (See Comments)   Shingrix [Zoster Vac Recomb Adjuvanted] Other (See Comments)   Wound Dressing Adhesive Other (See Comments)    Clear plastic tape caused rash   Review of Systems  Constitutional:  Negative for chills and fever.  HENT:  Negative for congestion.   Respiratory:  Negative for shortness of breath.   Cardiovascular:  Negative for chest pain and palpitations.  Gastrointestinal:  Negative for abdominal pain, blood in stool, constipation, diarrhea, nausea and vomiting.  Genitourinary:  Negative for dysuria, frequency, hematuria and urgency.  Skin:           Neurological:  Negative for headaches.      Objective:    Physical Exam Constitutional:      General: She is not in acute distress.    Appearance: Normal appearance. She is normal weight. She is not ill-appearing.  HENT:     Head: Normocephalic and atraumatic.     Right Ear: Tympanic membrane, ear canal and external ear normal.     Left Ear: Tympanic membrane, ear canal and external ear normal.     Nose: Nose normal.     Mouth/Throat:     Mouth: Mucous membranes are moist.     Pharynx: Oropharynx is clear.  Eyes:     General:        Right eye: No discharge.        Left eye: No discharge.     Extraocular Movements: Extraocular movements intact.     Right eye: No nystagmus.     Left eye: No nystagmus.     Pupils: Pupils are equal, round, and reactive to light.  Neck:     Vascular: No  carotid bruit.  Cardiovascular:     Rate and Rhythm: Normal rate and regular rhythm.     Pulses: Normal pulses.     Heart sounds: Normal heart sounds. No murmur heard.    No gallop.  Pulmonary:     Effort: Pulmonary effort is normal. No respiratory distress.     Breath sounds: Normal breath sounds. No wheezing or rales.  Abdominal:     General: Bowel sounds are normal.     Palpations: Abdomen is soft.     Tenderness: There is no abdominal tenderness. There is no guarding.  Musculoskeletal:  General: Normal range of motion.     Cervical back: Normal range of motion.     Right lower leg: No edema.     Left lower leg: No edema.     Comments: Muscle strength 5/5 on upper and lower extremities.   Lymphadenopathy:     Cervical: No cervical adenopathy.  Skin:    General: Skin is warm and dry.  Neurological:     Mental Status: She is alert and oriented to person, place, and time.     Sensory: Sensation is intact.     Motor: Motor function is intact.     Coordination: Coordination is intact.     Deep Tendon Reflexes:     Reflex Scores:      Patellar reflexes are 1+ on the right side and 1+ on the left side. Psychiatric:        Mood and Affect: Mood normal.        Behavior: Behavior normal.        Judgment: Judgment normal.    BP 118/70 (BP Location: Right Arm, Patient Position: Sitting, Cuff Size: Normal)   Pulse 70   Temp (!) 97.5 F (36.4 C) (Oral)   Resp 16   Ht '5\' 6"'$  (1.676 m)   Wt 160 lb 6.4 oz (72.8 kg)   SpO2 98%   BMI 25.89 kg/m  Wt Readings from Last 3 Encounters:  08/17/22 160 lb 6.4 oz (72.8 kg)  02/02/22 158 lb 3.2 oz (71.8 kg)  08/12/21 151 lb 6.4 oz (68.7 kg)   Diabetic Foot Exam - Simple   No data filed    Lab Results  Component Value Date   WBC 5.8 08/17/2022   HGB 13.0 08/17/2022   HCT 39.5 08/17/2022   PLT 354.0 08/17/2022   GLUCOSE 87 08/17/2022   CHOL 243 (H) 08/17/2022   TRIG 113.0 08/17/2022   HDL 64.10 08/17/2022   LDLCALC 156 (H)  08/17/2022   ALT 13 08/17/2022   AST 17 08/17/2022   NA 138 08/17/2022   K 4.4 08/17/2022   CL 102 08/17/2022   CREATININE 0.82 08/17/2022   BUN 15 08/17/2022   CO2 32 08/17/2022   TSH 0.79 08/17/2022   Lab Results  Component Value Date   TSH 0.79 08/17/2022   Lab Results  Component Value Date   WBC 5.8 08/17/2022   HGB 13.0 08/17/2022   HCT 39.5 08/17/2022   MCV 89.5 08/17/2022   PLT 354.0 08/17/2022   Lab Results  Component Value Date   NA 138 08/17/2022   K 4.4 08/17/2022   CO2 32 08/17/2022   GLUCOSE 87 08/17/2022   BUN 15 08/17/2022   CREATININE 0.82 08/17/2022   BILITOT 0.6 08/17/2022   ALKPHOS 66 08/17/2022   AST 17 08/17/2022   ALT 13 08/17/2022   PROT 7.5 08/17/2022   ALBUMIN 4.6 08/17/2022   CALCIUM 9.8 08/17/2022   ANIONGAP 11 02/05/2021   GFR 72.99 08/17/2022   Lab Results  Component Value Date   CHOL 243 (H) 08/17/2022   Lab Results  Component Value Date   HDL 64.10 08/17/2022   Lab Results  Component Value Date   LDLCALC 156 (H) 08/17/2022   Lab Results  Component Value Date   TRIG 113.0 08/17/2022   Lab Results  Component Value Date   CHOLHDL 4 08/17/2022   No results found for: "HGBA1C"     Assessment & Plan:  Colonoscopy: Last completed in 2022. Normal results. Repeat in 10 years.  DEXA: Last completed on 06/05/2022.  This patient is considered OSTEOPENIC according to [facility]) criteria. Repeat in 5 years.  Mammogram: Last completed on 06/05/2022 at [facility]. No mammographic evidence of malignancy. Repeat in 1 year.   Pap Smear: Last completed on 07/20/2017 with normal results. Patient is interested in repeating in 6 months at her next visit.  Advanced Directives: Encouraged patient to complete advanced care planning documents.  Healthy Lifestyle: Encouraged adequate sleep, heart healthy diet, hydration, and exercise.  Immunizations: Encouraged COVID-19,  Influenza, Prevnar 3, RSV, Shingles, and Tetanus (if injured) immunizations.  Labs: Blood work today will check vitamin D along with routine markers.  Problem List Items Addressed This Visit     Osteopenia    Bone density shows osteopenia, which is thinner than normal but not as bad as osteoporosis. Recommend calcium intake of 1200 to 1500 mg daily, divided into roughly 3 doses. Best source is the diet and a single dairy serving is about 500 mg, a supplement of calcium citrate once or twice daily to balance diet is fine if not getting enough in diet. Also need Vitamin D 2000 IU caps, 1 cap daily if not already taking vitamin D. Also recommend weight baring exercise on hips and upper body to keep bones strong       Relevant Orders   CBC with Differential/Platelet (Completed)   Vitamin D deficiency - Primary    Supplement and monitor, results say it is still low. Labs reveal deficiency. Start on Vitamin D 50000 IU caps, 1 cap po weekly x 12 weeks. Disp #4 with 4 rf. Also take daily Vitamin D over the counter. If already taking a daily supplement increase by 1000 IU daily and if not start Vitamin D 2000 IU daily.        Relevant Orders   TSH (Completed)   VITAMIN D 25 Hydroxy (Vit-D Deficiency, Fractures) (Completed)   Preventative health care    Patient encouraged to maintain heart healthy diet, regular exercise, adequate sleep. Consider daily probiotics. Take medications as prescribed. Labs ordered and reviewed  Colonoscopy patient 2022 Pap patient at next visit Mgm 06/2022 repeat in 2025 Dexa 06/2022 repeat 2025 to 27      Relevant Orders   CBC with Differential/Platelet (Completed)   TSH (Completed)   Hyperlipidemia    Encourage heart healthy diet such as MIND or DASH diet, increase exercise, avoid trans fats, simple carbohydrates and processed foods, consider a krill or fish or flaxseed oil cap daily.        Relevant Orders   Comprehensive metabolic panel (Completed)   Lipid  panel (Completed)   TSH (Completed)   No orders of the defined types were placed in this encounter.  I, Penni Homans, MD, personally preformed the services described in this documentation.  All medical record entries made by the scribe were at my direction and in my presence.  I have reviewed the chart and discharge instructions (if applicable) and agree that the record reflects my personal performance and is accurate and complete. 08/17/2022  I,Mohammed Iqbal,acting as a scribe for Penni Homans, MD.,have documented all relevant documentation on the behalf of Penni Homans, MD,as directed by  Penni Homans, MD while in the presence of Penni Homans, MD.  Penni Homans, MD

## 2022-08-18 ENCOUNTER — Other Ambulatory Visit: Payer: Self-pay | Admitting: *Deleted

## 2022-08-18 DIAGNOSIS — E559 Vitamin D deficiency, unspecified: Secondary | ICD-10-CM

## 2022-08-18 MED ORDER — VITAMIN D (ERGOCALCIFEROL) 1.25 MG (50000 UNIT) PO CAPS: 50000.0000 [IU] | ORAL_CAPSULE | ORAL | 4 refills | Status: DC

## 2022-08-21 ENCOUNTER — Encounter: Payer: Self-pay | Admitting: Family Medicine

## 2022-08-21 ENCOUNTER — Ambulatory Visit (INDEPENDENT_AMBULATORY_CARE_PROVIDER_SITE_OTHER): Payer: Medicare HMO | Admitting: Family Medicine

## 2022-08-21 VITALS — BP 116/70 | HR 89 | Temp 99.3°F | Ht 66.0 in | Wt 161.2 lb

## 2022-08-21 DIAGNOSIS — U071 COVID-19: Secondary | ICD-10-CM

## 2022-08-21 MED ORDER — NIRMATRELVIR/RITONAVIR (PAXLOVID)TABLET: 3.0000 | ORAL_TABLET | Freq: Two times a day (BID) | ORAL | 0 refills | Status: AC

## 2022-08-21 NOTE — Progress Notes (Signed)
Established Patient Office Visit   Subjective:  Patient ID: Susan Chang, female    DOB: 15-Dec-1950  Age: 71 y.o. MRN: [id number]  Chief Complaint  Patient presents with   Fever    Exposed to covid, body aches, fever, little cough and headace symptoms x 2 days.     Fever  Pertinent negatives include no abdominal pain, congestion, coughing, headaches, rash, sore throat or wheezing.   Encounter Diagnoses  Name Primary?   COVID-19 Yes   Presents with a 2-day history of fevers chills, malaise, fatigue, arthralgias and myalgias.  There is little headache and sore throat.  There is no cough.  She has no difficulty breathing or asthma history.  She has not had COVID vaccines.  Her husband was diagnosed with COVID on Friday.  She has no history of CKD.  Chart review shows a recent GFR of 73.   Review of Systems  Constitutional:  Positive for chills, fever and malaise/fatigue.  HENT: Negative.  Negative for congestion and sore throat.   Eyes:  Negative for blurred vision, discharge and redness.  Respiratory: Negative.  Negative for cough, shortness of breath and wheezing.   Cardiovascular: Negative.   Gastrointestinal:  Negative for abdominal pain.  Genitourinary: Negative.   Musculoskeletal:  Positive for joint pain and myalgias.  Skin:  Negative for rash.  Neurological:  Negative for tingling, loss of consciousness, weakness and headaches.  Endo/Heme/Allergies:  Negative for polydipsia.     Current Outpatient Medications:    acetaminophen (TYLENOL) 325 MG tablet, Take 2 tablets (650 mg total) by mouth every 6 (six) hours as needed for mild pain or moderate pain., Disp: , Rfl:    nirmatrelvir/ritonavir EUA (PAXLOVID) 20 x 150 MG & 10 x '100MG'$  TABS, Take 3 tablets by mouth 2 (two) times daily for 5 days. (Take nirmatrelvir 150 mg two tablets twice daily for 5 days and ritonavir 100 mg one tablet twice daily for 5 days) Patient GFR is 73, Disp: 30 tablet, Rfl: 0   triamcinolone  cream (KENALOG) 0.1 %, Apply 1 application topically 2 (two) times daily as needed., Disp: 80 g, Rfl: 0   Vitamin D, Ergocalciferol, (DRISDOL) 1.25 MG (50000 UNIT) CAPS capsule, Take 1 capsule (50,000 Units total) by mouth every 7 (seven) days., Disp: 4 capsule, Rfl: 4   Objective:     BP 116/70 (BP Location: Left Arm, Patient Position: Sitting, Cuff Size: Large)   Pulse 89   Temp 99.3 F (37.4 C) (Temporal)   Ht '5\' 6"'$  (1.676 m)   Wt 161 lb 3.2 oz (73.1 kg)   SpO2 96%   BMI 26.02 kg/m    Physical Exam Constitutional:      General: She is not in acute distress.    Appearance: Normal appearance. She is not ill-appearing, toxic-appearing or diaphoretic.  HENT:     Head: Normocephalic and atraumatic.     Right Ear: External ear normal.     Left Ear: External ear normal.  Eyes:     General: No scleral icterus.       Right eye: No discharge.        Left eye: No discharge.     Extraocular Movements: Extraocular movements intact.     Conjunctiva/sclera: Conjunctivae normal.  Cardiovascular:     Rate and Rhythm: Normal rate and regular rhythm.  Pulmonary:     Effort: Pulmonary effort is normal. No respiratory distress.     Breath sounds: Normal breath sounds. No wheezing or rales.  Musculoskeletal:     Cervical back: No rigidity or tenderness.  Lymphadenopathy:     Cervical: No cervical adenopathy.  Skin:    General: Skin is warm and dry.  Neurological:     Mental Status: She is alert and oriented to person, place, and time.  Psychiatric:        Mood and Affect: Mood normal.        Behavior: Behavior normal.      No results found for any visits on 08/21/22.    The 10-year ASCVD risk score (Arnett DK, et al., 2019) is: 7.3%    Assessment & Plan:   COVID-19 -     nirmatrelvir/ritonavir EUA; Take 3 tablets by mouth 2 (two) times daily for 5 days. (Take nirmatrelvir 150 mg two tablets twice daily for 5 days and ritonavir 100 mg one tablet twice daily for 5 days)  Patient GFR is 73  Dispense: 30 tablet; Refill: 0    Return in about 1 week (around 08/28/2022), or if symptoms worsen or fail to improve.  Rest.  Alternate Tylenol with ibuprofen as needed for fever chills and bodyaches.  Follow-up in 1 week if not improving.  Libby Maw, MD

## 2023-02-19 ENCOUNTER — Ambulatory Visit: Payer: Medicare HMO | Admitting: Family Medicine

## 2023-04-19 ENCOUNTER — Encounter (INDEPENDENT_AMBULATORY_CARE_PROVIDER_SITE_OTHER): Payer: Self-pay

## 2023-04-23 ENCOUNTER — Ambulatory Visit: Payer: Medicare HMO | Admitting: Family Medicine

## 2023-04-23 ENCOUNTER — Other Ambulatory Visit (HOSPITAL_BASED_OUTPATIENT_CLINIC_OR_DEPARTMENT_OTHER): Payer: Self-pay | Admitting: Family Medicine

## 2023-04-23 DIAGNOSIS — Z1231 Encounter for screening mammogram for malignant neoplasm of breast: Secondary | ICD-10-CM

## 2023-05-24 ENCOUNTER — Ambulatory Visit (INDEPENDENT_AMBULATORY_CARE_PROVIDER_SITE_OTHER): Payer: Medicare HMO | Admitting: Family Medicine

## 2023-05-24 VITALS — BP 123/59 | HR 70 | Temp 98.4°F | Resp 16 | Wt 158.0 lb

## 2023-05-24 DIAGNOSIS — M8580 Other specified disorders of bone density and structure, unspecified site: Secondary | ICD-10-CM

## 2023-05-24 DIAGNOSIS — R519 Headache, unspecified: Secondary | ICD-10-CM

## 2023-05-24 DIAGNOSIS — E559 Vitamin D deficiency, unspecified: Secondary | ICD-10-CM | POA: Diagnosis not present

## 2023-05-24 DIAGNOSIS — E782 Mixed hyperlipidemia: Secondary | ICD-10-CM | POA: Diagnosis not present

## 2023-05-24 LAB — LIPID PANEL
Cholesterol: 244 mg/dL — ABNORMAL HIGH (ref 0–200)
HDL: 70.9 mg/dL (ref >39.00)
LDL Cholesterol: 156 mg/dL — ABNORMAL HIGH (ref 0–99)
NonHDL: 173.07
Total CHOL/HDL Ratio: 3
Triglycerides: 85 mg/dL (ref 0.0–149.0)
VLDL: 17 mg/dL (ref 0.0–40.0)

## 2023-05-24 LAB — SEDIMENTATION RATE: Sed Rate: 7 mm/hr (ref 0–30)

## 2023-05-24 LAB — COMPREHENSIVE METABOLIC PANEL
ALT: 14 U/L (ref 0–35)
AST: 17 U/L (ref 0–37)
Albumin: 4.4 g/dL (ref 3.5–5.2)
Alkaline Phosphatase: 64 U/L (ref 39–117)
BUN: 13 mg/dL (ref 6–23)
CO2: 29 mEq/L (ref 19–32)
Calcium: 9.7 mg/dL (ref 8.4–10.5)
Chloride: 103 mEq/L (ref 96–112)
Creatinine, Ser: 0.86 mg/dL (ref 0.40–1.20)
GFR: 68.57 mL/min (ref >60.00)
Glucose, Bld: 91 mg/dL (ref 70–99)
Potassium: 4.4 mEq/L (ref 3.5–5.1)
Sodium: 140 mEq/L (ref 135–145)
Total Bilirubin: 0.5 mg/dL (ref 0.2–1.2)
Total Protein: 7.3 g/dL (ref 6.0–8.3)

## 2023-05-24 LAB — CBC WITH DIFFERENTIAL/PLATELET
Basophils Absolute: 0.1 10*3/uL (ref 0.0–0.1)
Basophils Relative: 1.2 % (ref 0.0–3.0)
Eosinophils Absolute: 0.1 10*3/uL (ref 0.0–0.7)
Eosinophils Relative: 1.2 % (ref 0.0–5.0)
HCT: 39.3 % (ref 36.0–46.0)
Hemoglobin: 12.7 g/dL (ref 12.0–15.0)
Lymphocytes Relative: 37.8 % (ref 12.0–46.0)
Lymphs Abs: 2.1 10*3/uL (ref 0.7–4.0)
MCHC: 32.4 g/dL (ref 30.0–36.0)
MCV: 90.3 fl (ref 78.0–100.0)
Monocytes Absolute: 0.5 10*3/uL (ref 0.1–1.0)
Monocytes Relative: 8.7 % (ref 3.0–12.0)
Neutro Abs: 2.9 10*3/uL (ref 1.4–7.7)
Neutrophils Relative %: 51.1 % (ref 43.0–77.0)
Platelets: 332 10*3/uL (ref 150.0–400.0)
RBC: 4.35 Mil/uL (ref 3.87–5.11)
RDW: 13.6 % (ref 11.5–15.5)
WBC: 5.6 10*3/uL (ref 4.0–10.5)

## 2023-05-24 LAB — TSH: TSH: 1.07 u[IU]/mL (ref 0.35–5.50)

## 2023-05-24 NOTE — Patient Instructions (Signed)
General Headache Without Cause A headache is pain or discomfort felt around the head or neck area. There are many causes and types of headaches. A few common types include: Tension headaches. Migraine headaches. Cluster headaches. Chronic daily headaches. Sometimes, the specific cause of a headache may not be found. Follow these instructions at home: Watch your condition for any changes. Let your health care provider know about them. Take these steps to help with your condition: Managing pain     Take over-the-counter and prescription medicines only as told by your health care provider. Treatment may include medicines for pain that are taken by mouth or applied to the skin. Lie down in a dark, quiet room when you have a headache. Keep lights dim if bright lights bother you or make your headaches worse. If directed, put ice on your head and neck area: Put ice in a plastic bag. Place a towel between your skin and the bag. Leave the ice on for 20 minutes, 2-3 times per day. Remove the ice if your skin turns bright red. This is very important. If you cannot feel pain, heat, or cold, you have a greater risk of damage to the area. If directed, apply heat to the affected area. Use the heat source that your health care provider recommends, such as a moist heat pack or a heating pad. Place a towel between your skin and the heat source. Leave the heat on for 20-30 minutes. Remove the heat if your skin turns bright red. This is especially important if you are unable to feel pain, heat, or cold. You have a greater risk of getting burned. Eating and drinking Eat meals on a regular schedule. If you drink alcohol: Limit how much you have to: 0-1 drink a day for women who are not pregnant. 0-2 drinks a day for men. Know how much alcohol is in a drink. In the [place] one drink equals one 12 oz bottle of beer (355 mL), one 5 oz glass of wine (148 mL), or one 1 oz glass of hard liquor (44 mL). Stop  drinking caffeine, or decrease the amount of caffeine you drink. Drink enough fluid to keep your urine pale yellow. General instructions  Keep a headache journal to help find out what may trigger your headaches. For example, write down: What you eat and drink. How much sleep you get. Any change to your diet or medicines. Try massage or other relaxation techniques. Limit stress. Sit up straight, and do not tense your muscles. Do not use any products that contain nicotine or tobacco. These products include cigarettes, chewing tobacco, and vaping devices, such as e-cigarettes. If you need help quitting, ask your health care provider. Exercise regularly as told by your health care provider. Sleep on a regular schedule. Get 7-9 hours of sleep each night, or the amount recommended by your health care provider. Keep all follow-up visits. This is important. Contact a health care provider if: Medicine does not help your symptoms. You have a headache that is different from your usual headache. You have nausea or you vomit. You have a fever. Get help right away if: Your headache: Becomes severe quickly. Gets worse after moderate to intense physical activity. You have any of these symptoms: Repeated vomiting. Pain or stiffness in your neck. Changes to your vision. Pain in an eye or ear. Problems with speech. Muscular weakness or loss of muscle control. Loss of balance or coordination. You feel faint or pass out. You have confusion. You have  a seizure. These symptoms may represent a serious problem that is an emergency. Do not wait to see if the symptoms will go away. Get medical help right away. Call your local emergency services (911 in the [place] Do not drive yourself to the hospital. Summary A headache is pain or discomfort felt around the head or neck area. There are many causes and types of headaches. In some cases, the cause may not be found. Keep a headache journal to help find out  what may trigger your headaches. Watch your condition for any changes. Let your health care provider know about them. Contact a health care provider if you have a headache that is different from the usual headache, or if your symptoms are not helped by medicine. Get help right away if your headache becomes severe, you vomit, you have a loss of vision, you lose your balance, or you have a seizure. This information is not intended to replace advice given to you by your health care provider. Make sure you discuss any questions you have with your health care provider. Document Revised: 01/19/2021 Document Reviewed: 01/19/2021 Elsevier Patient Education  2024 [employer].

## 2023-05-25 NOTE — Progress Notes (Unsigned)
Subjective:    Patient ID: Susan Chang, female    DOB: 17-Jul-1951, 72 y.o.   MRN: [id number]  Chief Complaint  Patient presents with  . Follow-up    HPI Discussed the use of AI scribe software for clinical note transcription with the patient, who gave verbal consent to proceed.  History of Present Illness   The patient, a 72 year old woman, presents with a headache that started about a month and a half ago. The headache began at the right temple and after a few days, move to the other side. She denies any associated symptoms such as congestion, vision changes, hearing changes, or nausea. She uses a medication from [place], to manage the headaches, which she reports as effective. She has no recent history of sickness or ER visits. It is an OTC medicine that contains Metamizole sodium, caffeine, thiamine, 500/50/38,75. The patient also mentions a past gallbladder issue, which was her first hospitalization since moving to the [place]. She does not elaborate on the specifics of this issue.  She has not had a Pap smear for over five years.  The patient has a history of COVID-19, which she contracted from her husband. She describes the experience as unpleasant but not severe. She does not take flu shots or COVID-19 vaccines, preferring to face the viruses naturally.        Past Medical History:  Diagnosis Date  . Breast cancer (HCC) 2000   right  . Osteopenia   . Preventative health care 07/13/2016  . Vitamin D deficiency 11/14/2015    Past Surgical History:  Procedure Laterality Date  . ABDOMINAL HYSTERECTOMY  09/05/1999   TAH b/l SPO for ovarian cyst,  . APPENDECTOMY    . BILIARY STENT PLACEMENT  02/04/2021   Procedure: BILIARY STENT PLACEMENT;  Surgeon: Willis Modena, MD;  Location: [facility] ENDOSCOPY;  Service: Endoscopy;;  . BREAST BIOPSY Right 09/04/1998  . BREAST LUMPECTOMY Right 2000  . CHOLECYSTECTOMY N/A 01/04/2021   Procedure: LAPAROSCOPIC  CHOLECYSTECTOMY WITH INTRAOPERATIVE CHOLANGIOGRAM;  Surgeon: Manus Rudd, MD;  Location: [facility] OR;  Service: General;  Laterality: N/A;  . ERCP N/A 02/04/2021   Procedure: ENDOSCOPIC RETROGRADE CHOLANGIOPANCREATOGRAPHY (ERCP);  Surgeon: Willis Modena, MD;  Location: [facility] ENDOSCOPY;  Service: Endoscopy;  Laterality: N/A;  . ERCP Bilateral 03/31/2021   Procedure: ENDOSCOPIC RETROGRADE CHOLANGIOPANCREATOGRAPHY (ERCP);  Surgeon: Willis Modena, MD;  Location: [facility] ENDOSCOPY;  Service: Endoscopy;  Laterality: Bilateral;  . LYMPH NODE DISSECTION Right 09/04/1998  . PANCREATIC STENT PLACEMENT  02/04/2021   Procedure: PANCREATIC STENT PLACEMENT;  Surgeon: Willis Modena, MD;  Location: [facility] ENDOSCOPY;  Service: Endoscopy;;  . REMOVAL OF STONES  03/31/2021   Procedure: REMOVAL OF STONES;  Surgeon: Willis Modena, MD;  Location: [facility] ENDOSCOPY;  Service: Endoscopy;;  . SPHINCTEROTOMY  02/04/2021   Procedure: SPHINCTEROTOMY;  Surgeon: Willis Modena, MD;  Location: [facility] ENDOSCOPY;  Service: Endoscopy;;  Needle Knife  . STENT REMOVAL  03/31/2021   Procedure: STENT REMOVAL;  Surgeon: Willis Modena, MD;  Location: [facility] ENDOSCOPY;  Service: Endoscopy;;    Family History  Problem Relation Age of Onset  . Breast cancer Mother 48  . Cancer Mother        Breast cancer  . Hypertension Mother   . Stroke Mother   . Cancer Father        lung cancer  . Cancer Brother   . Cancer Paternal Aunt        breast cancer    Social History   Socioeconomic  History  . Marital status: Married    Spouse name: Not on file  . Number of children: Not on file  . Years of education: Not on file  . Highest education level: Not on file  Occupational History  . Occupation: Naval architect  Tobacco Use  . Smoking status: Never  . Smokeless tobacco: Never  Vaping Use  . Vaping status: Never Used  Substance and Sexual Activity  . Alcohol use: Not Currently    Alcohol/week: 0.0 standard drinks of alcohol  . Drug use:  No  . Sexual activity: Yes    Comment: lives with husband, drafting and design, no dietary restrictions  Other Topics Concern  . Not on file  Social History Narrative   No major dietary restrictions but eats very little meat   Stays very physically active. Walks daily   Works English as a second language teacher of [employer] Strain: Not on file  Food Insecurity: Not on file  Transportation Needs: Not on file  Physical Activity: Not on file  Stress: Not on file  Social Connections: Not on file  Intimate Partner Violence: Not on file    Outpatient Medications Prior to Visit  Medication Sig Dispense Refill  . acetaminophen (TYLENOL) 325 MG tablet Take 2 tablets (650 mg total) by mouth every 6 (six) hours as needed for mild pain or moderate pain.    Marland Kitchen triamcinolone cream (KENALOG) 0.1 % Apply 1 application topically 2 (two) times daily as needed. 80 g 0  . Vitamin D, Ergocalciferol, (DRISDOL) 1.25 MG (50000 UNIT) CAPS capsule Take 1 capsule (50,000 Units total) by mouth every 7 (seven) days. 4 capsule 4   No facility-administered medications prior to visit.    Allergies  Allergen Reactions  . Pneumococcal Vaccine Other (See Comments)  . Shingrix [Zoster Vac Recomb Adjuvanted] Other (See Comments)  . Wound Dressing Adhesive Other (See Comments)    Clear plastic tape caused rash    Review of Systems  Constitutional:  Negative for fever and malaise/fatigue.  HENT:  Negative for congestion.   Eyes:  Negative for blurred vision.  Respiratory:  Negative for shortness of breath.   Cardiovascular:  Negative for chest pain, palpitations and leg swelling.  Gastrointestinal:  Negative for abdominal pain, blood in stool and nausea.  Genitourinary:  Negative for dysuria and frequency.  Musculoskeletal:  Negative for falls.  Skin:  Negative for rash.  Neurological:  Negative for dizziness, loss of consciousness and headaches.  Endo/Heme/Allergies:  Negative for  environmental allergies.  Psychiatric/Behavioral:  Negative for depression. The patient is not nervous/anxious.        Objective:    Physical Exam Constitutional:      General: She is not in acute distress.    Appearance: Normal appearance. She is well-developed. She is not toxic-appearing.  HENT:     Head: Normocephalic and atraumatic.     Right Ear: External ear normal.     Left Ear: External ear normal.     Nose: Nose normal.  Eyes:     General:        Right eye: No discharge.        Left eye: No discharge.     Conjunctiva/sclera: Conjunctivae normal.  Neck:     Thyroid: No thyromegaly.  Cardiovascular:     Rate and Rhythm: Normal rate and regular rhythm.     Heart sounds: Normal heart sounds. No murmur heard. Pulmonary:     Effort: Pulmonary effort  is normal. No respiratory distress.     Breath sounds: Normal breath sounds.  Abdominal:     General: Bowel sounds are normal.     Palpations: Abdomen is soft.     Tenderness: There is no abdominal tenderness. There is no guarding.  Musculoskeletal:        General: Normal range of motion.     Cervical back: Neck supple.  Lymphadenopathy:     Cervical: No cervical adenopathy.  Skin:    General: Skin is warm and dry.  Neurological:     Mental Status: She is alert and oriented to person, place, and time.  Psychiatric:        Mood and Affect: Mood normal.        Behavior: Behavior normal.        Thought Content: Thought content normal.        Judgment: Judgment normal.    BP (!) 123/59 (BP Location: Left Arm, Patient Position: Sitting, Cuff Size: Small)   Pulse 70   Temp 98.4 F (36.9 C) (Oral)   Resp 16   Wt 158 lb (71.7 kg)   SpO2 99%   BMI 25.50 kg/m  Wt Readings from Last 3 Encounters:  05/24/23 158 lb (71.7 kg)  08/21/22 161 lb 3.2 oz (73.1 kg)  08/17/22 160 lb 6.4 oz (72.8 kg)    Diabetic Foot Exam - Simple   No data filed    Lab Results  Component Value Date   WBC 5.6 05/24/2023   HGB 12.7  05/24/2023   HCT 39.3 05/24/2023   PLT 332.0 05/24/2023   GLUCOSE 91 05/24/2023   CHOL 244 (H) 05/24/2023   TRIG 85.0 05/24/2023   HDL 70.90 05/24/2023   LDLCALC 156 (H) 05/24/2023   ALT 14 05/24/2023   AST 17 05/24/2023   NA 140 05/24/2023   K 4.4 05/24/2023   CL 103 05/24/2023   CREATININE 0.86 05/24/2023   BUN 13 05/24/2023   CO2 29 05/24/2023   TSH 1.07 05/24/2023    Lab Results  Component Value Date   TSH 1.07 05/24/2023   Lab Results  Component Value Date   WBC 5.6 05/24/2023   HGB 12.7 05/24/2023   HCT 39.3 05/24/2023   MCV 90.3 05/24/2023   PLT 332.0 05/24/2023   Lab Results  Component Value Date   NA 140 05/24/2023   K 4.4 05/24/2023   CO2 29 05/24/2023   GLUCOSE 91 05/24/2023   BUN 13 05/24/2023   CREATININE 0.86 05/24/2023   BILITOT 0.5 05/24/2023   ALKPHOS 64 05/24/2023   AST 17 05/24/2023   ALT 14 05/24/2023   PROT 7.3 05/24/2023   ALBUMIN 4.4 05/24/2023   CALCIUM 9.7 05/24/2023   ANIONGAP 11 02/05/2021   GFR 68.57 05/24/2023   Lab Results  Component Value Date   CHOL 244 (H) 05/24/2023   Lab Results  Component Value Date   HDL 70.90 05/24/2023   Lab Results  Component Value Date   LDLCALC 156 (H) 05/24/2023   Lab Results  Component Value Date   TRIG 85.0 05/24/2023   Lab Results  Component Value Date   CHOLHDL 3 05/24/2023   No results found for: "HGBA1C"     Assessment & Plan:  Nonintractable headache, unspecified chronicity pattern, unspecified headache type -     CBC with Differential/Platelet -     TSH -     Sedimentation rate  Vitamin D deficiency -     TSH  Mixed hyperlipidemia -  Comprehensive metabolic panel -     Lipid panel -     TSH  Osteopenia, unspecified location    Assessment and Plan    Headache Unilateral headache that started in the right temple and moved to the left. Occurred 1.5 months ago. No associated symptoms such as vision changes, hearing changes, or nausea. No recent trauma or  congestion. Possible association with weather changes. -Advise to monitor for pattern and report if headaches become more frequent or severe. -Consider over-the-counter pain relief such as Tylenol or Advil as needed.  Pap Smear Due for routine Pap smear. No current symptoms such as discharge or pain. Last Pap smear was over 5 years ago. -Schedule Pap smear during checkup in December.  Mammogram Due for routine mammogram. -Continue with scheduled mammogram in October.  Prescription Refill Request for refill of triamcinolone cream. -Send prescription for triamcinolone cream to patient's pharmacy.  General Health Maintenance / Followup Plans -Complete blood work today. If results are normal, consider not repeating at checkup in December. -If results are abnormal, repeat at checkup in December. -Add inflammatory marker (Sed rate) to blood work to rule in/out type of headache. -See patient for checkup in December.         Danise Edge, MD

## 2023-06-18 ENCOUNTER — Ambulatory Visit (HOSPITAL_BASED_OUTPATIENT_CLINIC_OR_DEPARTMENT_OTHER)
Admission: RE | Admit: 2023-06-18 | Discharge: 2023-06-18 | Disposition: A | Discharge status: Home / Self Care | Payer: Medicare HMO | Source: Ambulatory Visit | Attending: Family Medicine | Admitting: Family Medicine

## 2023-06-18 ENCOUNTER — Encounter (HOSPITAL_BASED_OUTPATIENT_CLINIC_OR_DEPARTMENT_OTHER): Payer: Self-pay

## 2023-06-18 ENCOUNTER — Encounter (HOSPITAL_BASED_OUTPATIENT_CLINIC_OR_DEPARTMENT_OTHER): Payer: Self-pay | Admitting: Family Medicine

## 2023-06-18 DIAGNOSIS — Z1231 Encounter for screening mammogram for malignant neoplasm of breast: Secondary | ICD-10-CM | POA: Diagnosis not present

## 2023-08-20 ENCOUNTER — Other Ambulatory Visit: Payer: Self-pay | Admitting: Emergency Medicine

## 2023-08-20 DIAGNOSIS — E782 Mixed hyperlipidemia: Secondary | ICD-10-CM

## 2023-08-20 DIAGNOSIS — R519 Headache, unspecified: Secondary | ICD-10-CM

## 2023-08-20 DIAGNOSIS — E559 Vitamin D deficiency, unspecified: Secondary | ICD-10-CM

## 2023-08-20 DIAGNOSIS — Z0000 Encounter for general adult medical examination without abnormal findings: Secondary | ICD-10-CM

## 2023-08-20 NOTE — Assessment & Plan Note (Signed)
Supplement and monitor, results say it is still low. Labs reveal deficiency. Start on Vitamin D 50000 IU caps, 1 cap po weekly x 12 weeks. Disp #4 with 4 rf. Also take daily Vitamin D over the counter. If already taking a daily supplement increase by 1000 IU daily and if not start Vitamin D 2000 IU daily.

## 2023-08-20 NOTE — Assessment & Plan Note (Signed)
Bone density shows osteopenia, which is thinner than normal but not as bad as osteoporosis. Recommend calcium intake of 1200 to 1500 mg daily, divided into roughly 3 doses. Best source is the diet and a single dairy serving is about 500 mg, a supplement of calcium citrate once or twice daily to balance diet is fine if not getting enough in diet. Also need Vitamin D 2000 IU caps, 1 cap daily if not already taking vitamin D. Also recommend weight baring exercise on hips and upper body to keep bones strong  

## 2023-08-20 NOTE — Assessment & Plan Note (Signed)
Encourage heart healthy diet such as MIND or DASH diet, increase exercise, avoid trans fats, simple carbohydrates and processed foods, consider a krill or fish or flaxseed oil cap daily.  °

## 2023-08-20 NOTE — Assessment & Plan Note (Addendum)
Patient encouraged to maintain heart healthy diet, regular exercise, adequate sleep. Consider daily probiotics. Take medications as prescribed. Labs ordered and reviewed  Colonoscopy patient 2022 with polyps repeat in 2027 or so Pap today Mgm 06/2023 repeat annually Dexa 06/2022 repeat in 2025 or 2026 Given and reviewed copy of ACP documents from [employer] and encouraged to complete and return

## 2023-08-21 ENCOUNTER — Encounter: Payer: Self-pay | Admitting: Family Medicine

## 2023-08-21 ENCOUNTER — Ambulatory Visit (INDEPENDENT_AMBULATORY_CARE_PROVIDER_SITE_OTHER): Payer: Medicare HMO | Admitting: Family Medicine

## 2023-08-21 ENCOUNTER — Other Ambulatory Visit (HOSPITAL_COMMUNITY)
Admission: RE | Admit: 2023-08-21 | Discharge: 2023-08-21 | Disposition: A | Discharge status: Home / Self Care | Payer: Medicare HMO | Source: Ambulatory Visit | Attending: Family Medicine | Admitting: Family Medicine

## 2023-08-21 VITALS — BP 108/74 | HR 67 | Temp 98.0°F | Resp 16 | Ht 66.0 in | Wt 162.4 lb

## 2023-08-21 DIAGNOSIS — Z01419 Encounter for gynecological examination (general) (routine) without abnormal findings: Secondary | ICD-10-CM | POA: Insufficient documentation

## 2023-08-21 DIAGNOSIS — Z1151 Encounter for screening for human papillomavirus (HPV): Secondary | ICD-10-CM | POA: Insufficient documentation

## 2023-08-21 DIAGNOSIS — Z0000 Encounter for general adult medical examination without abnormal findings: Secondary | ICD-10-CM | POA: Diagnosis not present

## 2023-08-21 DIAGNOSIS — Z124 Encounter for screening for malignant neoplasm of cervix: Secondary | ICD-10-CM

## 2023-08-21 DIAGNOSIS — M8580 Other specified disorders of bone density and structure, unspecified site: Secondary | ICD-10-CM | POA: Diagnosis not present

## 2023-08-21 DIAGNOSIS — E782 Mixed hyperlipidemia: Secondary | ICD-10-CM

## 2023-08-21 DIAGNOSIS — E559 Vitamin D deficiency, unspecified: Secondary | ICD-10-CM

## 2023-08-21 LAB — COMPREHENSIVE METABOLIC PANEL
ALT: 13 U/L (ref 0–35)
AST: 17 U/L (ref 0–37)
Albumin: 4.6 g/dL (ref 3.5–5.2)
Alkaline Phosphatase: 65 U/L (ref 39–117)
BUN: 11 mg/dL (ref 6–23)
CO2: 30 meq/L (ref 19–32)
Calcium: 9.4 mg/dL (ref 8.4–10.5)
Chloride: 104 meq/L (ref 96–112)
Creatinine, Ser: 0.88 mg/dL (ref 0.40–1.20)
GFR: 66.59 mL/min (ref >60.00)
Glucose, Bld: 91 mg/dL (ref 70–99)
Potassium: 4.5 meq/L (ref 3.5–5.1)
Sodium: 140 meq/L (ref 135–145)
Total Bilirubin: 0.5 mg/dL (ref 0.2–1.2)
Total Protein: 7.3 g/dL (ref 6.0–8.3)

## 2023-08-21 LAB — CBC WITH DIFFERENTIAL/PLATELET
Basophils Absolute: 0 10*3/uL (ref 0.0–0.1)
Basophils Relative: 0.9 % (ref 0.0–3.0)
Eosinophils Absolute: 0.1 10*3/uL (ref 0.0–0.7)
Eosinophils Relative: 1.2 % (ref 0.0–5.0)
HCT: 38.4 % (ref 36.0–46.0)
Hemoglobin: 12.6 g/dL (ref 12.0–15.0)
Lymphocytes Relative: 36.3 % (ref 12.0–46.0)
Lymphs Abs: 2 10*3/uL (ref 0.7–4.0)
MCHC: 32.9 g/dL (ref 30.0–36.0)
MCV: 90 fL (ref 78.0–100.0)
Monocytes Absolute: 0.5 10*3/uL (ref 0.1–1.0)
Monocytes Relative: 8.8 % (ref 3.0–12.0)
Neutro Abs: 2.9 10*3/uL (ref 1.4–7.7)
Neutrophils Relative %: 52.8 % (ref 43.0–77.0)
Platelets: 333 10*3/uL (ref 150.0–400.0)
RBC: 4.26 Mil/uL (ref 3.87–5.11)
RDW: 13.7 % (ref 11.5–15.5)
WBC: 5.4 10*3/uL (ref 4.0–10.5)

## 2023-08-21 LAB — LIPID PANEL
Cholesterol: 247 mg/dL — ABNORMAL HIGH (ref 0–200)
HDL: 67.2 mg/dL (ref >39.00)
LDL Cholesterol: 163 mg/dL — ABNORMAL HIGH (ref 0–99)
NonHDL: 179.71
Total CHOL/HDL Ratio: 4
Triglycerides: 85 mg/dL (ref 0.0–149.0)
VLDL: 17 mg/dL (ref 0.0–40.0)

## 2023-08-21 LAB — TSH: TSH: 1.17 u[IU]/mL (ref 0.35–5.50)

## 2023-08-21 LAB — VITAMIN D 25 HYDROXY (VIT D DEFICIENCY, FRACTURES): VITD: 20.43 ng/mL — ABNORMAL LOW (ref 30.00–100.00)

## 2023-08-21 NOTE — Progress Notes (Signed)
Subjective:    Patient ID: Susan Chang, female    DOB: 03-05-1951, 72 y.o.   MRN: [id number]  Chief Complaint  Patient presents with   Annual Exam    HPI Discussed the use of AI scribe software for clinical note transcription with the patient, who gave verbal consent to proceed.  History of Present Illness   The patient, with a history of regular physical activity and good hydration habits, presents for a routine physical examination. They report changes in bowel movements, describing them as looser than usual. The patient denies any associated symptoms such as abdominal pain, blood in stool, or weight loss. They maintain a healthy lifestyle, including regular physical activity and a balanced diet. The patient also reports adequate hydration, consuming 60-80 ounces of fluids daily, primarily in the form of green tea. They deny any recent changes in diet, medication, or lifestyle that could account for the changes in bowel movements.        Past Medical History:  Diagnosis Date   Breast cancer (HCC) 2000   right   Osteopenia    Preventative health care 07/13/2016   Vitamin D deficiency 11/14/2015    Past Surgical History:  Procedure Laterality Date   ABDOMINAL HYSTERECTOMY  09/05/1999   TAH b/l SPO for ovarian cyst,   APPENDECTOMY     BILIARY STENT PLACEMENT  02/04/2021   Procedure: BILIARY STENT PLACEMENT;  Surgeon: Willis Modena, MD;  Location: [facility];  Service: Endoscopy;;   BREAST BIOPSY Right 09/04/1998   BREAST LUMPECTOMY Right 2000   CHOLECYSTECTOMY N/A 01/04/2021   Procedure: LAPAROSCOPIC CHOLECYSTECTOMY WITH INTRAOPERATIVE CHOLANGIOGRAM;  Surgeon: Manus Rudd, MD;  Location: [facility] OR;  Service: General;  Laterality: N/A;   ERCP N/A 02/04/2021   Procedure: ENDOSCOPIC RETROGRADE CHOLANGIOPANCREATOGRAPHY (ERCP);  Surgeon: Willis Modena, MD;  Location: [facility] ENDOSCOPY;  Service: Endoscopy;  Laterality: N/A;   ERCP Bilateral 03/31/2021   Procedure: ENDOSCOPIC  RETROGRADE CHOLANGIOPANCREATOGRAPHY (ERCP);  Surgeon: Willis Modena, MD;  Location: [facility];  Service: Endoscopy;  Laterality: Bilateral;   LYMPH NODE DISSECTION Right 09/04/1998   PANCREATIC STENT PLACEMENT  02/04/2021   Procedure: PANCREATIC STENT PLACEMENT;  Surgeon: Willis Modena, MD;  Location: [facility];  Service: Endoscopy;;   REMOVAL OF STONES  03/31/2021   Procedure: REMOVAL OF STONES;  Surgeon: Willis Modena, MD;  Location: [facility] ENDOSCOPY;  Service: Endoscopy;;   SPHINCTEROTOMY  02/04/2021   Procedure: Dennison Mascot;  Surgeon: Willis Modena, MD;  Location: [facility] ENDOS[facility];  Service: Endoscopy;;  Needle Knife   STENT REMOVAL  03/31/2021   Procedure: STENT REMOVAL;  Surgeon: Willis Modena, MD;  Location: [facility] ENDOSCOPY;  Service: Endoscopy;;    Family History  Problem Relation Age of Onset   Breast cancer Mother 69   Cancer Mother        Breast cancer   Hypertension Mother    Stroke Mother    Cancer Father        lung cancer   Cancer Brother    Cancer Paternal Aunt        breast cancer    Social History   Socioeconomic History   Marital status: Married    Spouse name: Not on file   Number of children: Not on file   Years of education: Not on file   Highest education level: Not on file  Occupational History   Occupation: Estate agent and drafting  Tobacco Use   Smoking status: Never   Smokeless tobacco: Never  Vaping Use   Vaping status:  Never Used  Substance and Sexual Activity   Alcohol use: Not Currently    Alcohol/week: 0.0 standard drinks of alcohol   Drug use: No   Sexual activity: Yes    Comment: lives with husband, drafting and design, no dietary restrictions  Other Topics Concern   Not on file  Social History Narrative   No major dietary restrictions but eats very little meat   Stays very physically active. Walks daily   Works Art gallery manager   Social Drivers of Corporate investment banker Strain: Not on file  Food Insecurity: Not on  file  Transportation Needs: Not on file  Physical Activity: Not on file  Stress: Not on file  Social Connections: Not on file  Intimate Partner Violence: Not on file    Outpatient Medications Prior to Visit  Medication Sig Dispense Refill   acetaminophen (TYLENOL) 325 MG tablet Take 2 tablets (650 mg total) by mouth every 6 (six) hours as needed for mild pain or moderate pain.     triamcinolone cream (KENALOG) 0.1 % Apply 1 application topically 2 (two) times daily as needed. 80 g 0   No facility-administered medications prior to visit.    Allergies  Allergen Reactions   Pneumococcal Vaccine Other (See Comments)   Shingrix [Zoster Vac Recomb Adjuvanted] Other (See Comments)   Wound Dressing Adhesive Other (See Comments)    Clear plastic tape caused rash    Review of Systems  Constitutional:  Negative for chills, fever and malaise/fatigue.  HENT:  Negative for congestion and hearing loss.   Eyes:  Negative for discharge.  Respiratory:  Negative for cough, sputum production and shortness of breath.   Cardiovascular:  Negative for chest pain, palpitations and leg swelling.  Gastrointestinal:  Negative for abdominal pain, blood in stool, constipation, diarrhea, heartburn, nausea and vomiting.  Genitourinary:  Negative for dysuria, frequency, hematuria and urgency.  Musculoskeletal:  Negative for back pain, falls and myalgias.  Skin:  Negative for rash.  Neurological:  Negative for dizziness, sensory change, loss of consciousness, weakness and headaches.  Endo/Heme/Allergies:  Negative for environmental allergies. Does not bruise/bleed easily.  Psychiatric/Behavioral:  Negative for depression and suicidal ideas. The patient is not nervous/anxious and does not have insomnia.        Objective:    Physical Exam Constitutional:      General: She is not in acute distress.    Appearance: Normal appearance. She is not diaphoretic.  HENT:     Head: Normocephalic and atraumatic.      Right Ear: Tympanic membrane, ear canal and external ear normal.     Left Ear: Tympanic membrane, ear canal and external ear normal.     Nose: Nose normal.     Mouth/Throat:     Mouth: Mucous membranes are moist.     Pharynx: Oropharynx is clear. No oropharyngeal exudate.  Eyes:     General: No scleral icterus.       Right eye: No discharge.        Left eye: No discharge.     Conjunctiva/sclera: Conjunctivae normal.     Pupils: Pupils are equal, round, and reactive to light.  Neck:     Thyroid: No thyromegaly.  Cardiovascular:     Rate and Rhythm: Normal rate and regular rhythm.     Heart sounds: Normal heart sounds. No murmur heard. Pulmonary:     Effort: Pulmonary effort is normal. No respiratory distress.     Breath sounds: Normal breath sounds. No  wheezing or rales.  Abdominal:     General: Bowel sounds are normal. There is no distension.     Palpations: Abdomen is soft. There is no mass.     Tenderness: There is no abdominal tenderness.  Musculoskeletal:        General: No tenderness. Normal range of motion.     Cervical back: Normal range of motion and neck supple.  Lymphadenopathy:     Cervical: No cervical adenopathy.  Skin:    General: Skin is warm and dry.     Findings: No rash.  Neurological:     General: No focal deficit present.     Mental Status: She is alert and oriented to person, place, and time.     Cranial Nerves: No cranial nerve deficit.     Coordination: Coordination normal.     Deep Tendon Reflexes: Reflexes are normal and symmetric. Reflexes normal.  Psychiatric:        Mood and Affect: Mood normal.        Behavior: Behavior normal.        Thought Content: Thought content normal.        Judgment: Judgment normal.     BP 108/74 (BP Location: Left Arm, Patient Position: Sitting, Cuff Size: Normal)   Pulse 67   Temp 98 F (36.7 C) (Oral)   Resp 16   Ht 5\' 6"  (1.676 m)   Wt 162 lb 6.4 oz (73.7 kg)   SpO2 97%   BMI 26.21 kg/m  Wt Readings  from Last 3 Encounters:  08/21/23 162 lb 6.4 oz (73.7 kg)  05/24/23 158 lb (71.7 kg)  08/21/22 161 lb 3.2 oz (73.1 kg)    Diabetic Foot Exam - Simple   No data filed    Lab Results  Component Value Date   WBC 5.6 05/24/2023   HGB 12.7 05/24/2023   HCT 39.3 05/24/2023   PLT 332.0 05/24/2023   GLUCOSE 91 05/24/2023   CHOL 244 (H) 05/24/2023   TRIG 85.0 05/24/2023   HDL 70.90 05/24/2023   LDLCALC 156 (H) 05/24/2023   ALT 14 05/24/2023   AST 17 05/24/2023   NA 140 05/24/2023   K 4.4 05/24/2023   CL 103 05/24/2023   CREATININE 0.86 05/24/2023   BUN 13 05/24/2023   CO2 29 05/24/2023   TSH 1.07 05/24/2023    Lab Results  Component Value Date   TSH 1.07 05/24/2023   Lab Results  Component Value Date   WBC 5.6 05/24/2023   HGB 12.7 05/24/2023   HCT 39.3 05/24/2023   MCV 90.3 05/24/2023   PLT 332.0 05/24/2023   Lab Results  Component Value Date   NA 140 05/24/2023   K 4.4 05/24/2023   CO2 29 05/24/2023   GLUCOSE 91 05/24/2023   BUN 13 05/24/2023   CREATININE 0.86 05/24/2023   BILITOT 0.5 05/24/2023   ALKPHOS 64 05/24/2023   AST 17 05/24/2023   ALT 14 05/24/2023   PROT 7.3 05/24/2023   ALBUMIN 4.4 05/24/2023   CALCIUM 9.7 05/24/2023   ANIONGAP 11 02/05/2021   GFR 68.57 05/24/2023   Lab Results  Component Value Date   CHOL 244 (H) 05/24/2023   Lab Results  Component Value Date   HDL 70.90 05/24/2023   Lab Results  Component Value Date   LDLCALC 156 (H) 05/24/2023   Lab Results  Component Value Date   TRIG 85.0 05/24/2023   Lab Results  Component Value Date   CHOLHDL 3 05/24/2023  No results found for: "HGBA1C"     Assessment & Plan:  Vitamin D deficiency Assessment & Plan: Supplement and monitor, results say it is still low. Labs reveal deficiency. Start on Vitamin D [address] IU caps, 1 cap po weekly x 12 weeks. Disp #4 with 4 rf. Also take daily Vitamin D over the counter. If already taking a daily supplement increase by 1000 IU daily and if  not start Vitamin D 2000 IU daily.    Orders: -     CBC with Differential/Platelet -     TSH -     VITAMIN D 25 Hydroxy (Vit-D Deficiency, Fractures)  Osteopenia, unspecified location Assessment & Plan: Bone density shows osteopenia, which is thinner than normal but not as bad as osteoporosis. Recommend calcium intake of 1200 to 1500 mg daily, divided into roughly 3 doses. Best source is the diet and a single dairy serving is about 500 mg, a supplement of calcium citrate once or twice daily to balance diet is fine if not getting enough in diet. Also need Vitamin D 2000 IU caps, 1 cap daily if not already taking vitamin D. Also recommend weight baring exercise on hips and upper body to keep bones strong   Orders: -     CBC with Differential/Platelet -     TSH  Mixed hyperlipidemia Assessment & Plan: Encourage heart healthy diet such as MIND or DASH diet, increase exercise, avoid trans fats, simple carbohydrates and processed foods, consider a krill or fish or flaxseed oil cap daily.    Orders: -     Lipid panel -     Comprehensive metabolic panel -     CBC with Differential/Platelet -     TSH  Preventative health care Assessment & Plan: Patient encouraged to maintain heart healthy diet, regular exercise, adequate sleep. Consider daily probiotics. Take medications as prescribed. Labs ordered and reviewed  Colonoscopy patient 2022 with polyps repeat in 2027 or so Pap today Mgm 06/2023 repeat annually Dexa 06/2022 repeat in 2025 or 2026 Given and reviewed copy of ACP documents from [employer] and encouraged to complete and return    Screening for malignant neoplasm of cervix -     Cytology - PAP    Assessment and Plan    Hydration and Aging Discussed the importance of hydration as we age due to decreased muscle mass and water reserve. Emphasized the need for consistent fluid intake throughout the day. -Continue to consume 60-80 ounces of fluids daily, spread out  throughout the day.  Physical Activity Discussed the importance of maintaining a minimum of 4000 steps per day to avoid being classified as sedentary and the benefits of achieving 8000 steps per day for longevity and health. -Maintain a minimum of 4000 steps per day, aim for 8000 steps per day.  Pap Smear Last Pap smear was more than 5 years ago. No history of abnormal Pap smears. -Perform Pap smear today. Discuss frequency of future Pap smears based on today's results.  Mammogram Last mammogram was in 2023. No current concerns. -Continue with mammograms as per patient's preference (annually or biennially).  Vaccinations Discussed the importance of vaccinations, particularly for shingles and respiratory syncytial virus (RSV). Tetanus shot last received in 2016. -Consider shingles and RSV vaccinations. Tetanus shot due in 2026 or sooner if injured.  Colonoscopy Last colonoscopy was in 2022 with a few polyps found. Next colonoscopy due between 2027-2029 based on previous findings and patient's preference. -Plan for next colonoscopy between 2027-2029  unless changes in bowel habits or blood in stool.  Follow-up No current concerns or changes in family history.         Danise Edge, MD

## 2023-08-21 NOTE — Patient Instructions (Addendum)
10 ounces every 1-2 hours  4000, 8000 steps  Shingrix is the new shingles shot, 2 shots over 2-6 months, confirm coverage with insurance and document, then can return here for shots with nurse appt or at pharmacy   Prevnar 20 once  RSV, Respiratory Syncitial Virus Vaccine, Arexvy at pharmacy  Annual flu and Covid   Tetanus 2026 or sooner if injured  Netflix Live to 100 the secrets of the [facility] Zones Preventive Care 65 Years and Older, Female Preventive care refers to lifestyle choices and visits with your health care provider that can promote health and wellness. Preventive care visits are also called wellness exams. What can I expect for my preventive care visit? Counseling Your health care provider may ask you questions about your: Medical history, including: Past medical problems. Family medical history. Pregnancy and menstrual history. History of falls. Current health, including: Memory and ability to understand (cognition). Emotional well-being. Home life and relationship well-being. Sexual activity and sexual health. Lifestyle, including: Alcohol, nicotine or tobacco, and drug use. Access to firearms. Diet, exercise, and sleep habits. Work and work Astronomer. Sunscreen use. Safety issues such as seatbelt and bike helmet use. Physical exam Your health care provider will check your: Height and weight. These may be used to calculate your BMI (body mass index). BMI is a measurement that tells if you are at a healthy weight. Waist circumference. This measures the distance around your waistline. This measurement also tells if you are at a healthy weight and may help predict your risk of certain diseases, such as type 2 diabetes and high blood pressure. Heart rate and blood pressure. Body temperature. Skin for abnormal spots. What immunizations do I need?  Vaccines are usually given at various ages, according to a schedule. Your health care provider will recommend  vaccines for you based on your age, medical history, and lifestyle or other factors, such as travel or where you work. What tests do I need? Screening Your health care provider may recommend screening tests for certain conditions. This may include: Lipid and cholesterol levels. Hepatitis C test. Hepatitis B test. HIV (human immunodeficiency virus) test. STI (sexually transmitted infection) testing, if you are at risk. Lung cancer screening. Colorectal cancer screening. Diabetes screening. This is done by checking your blood sugar (glucose) after you have not eaten for a while (fasting). Mammogram. Talk with your health care provider about how often you should have regular mammograms. BRCA-related cancer screening. This may be done if you have a family history of breast, ovarian, tubal, or peritoneal cancers. Bone density scan. This is done to screen for osteoporosis. Talk with your health care provider about your test results, treatment options, and if necessary, the need for more tests. Follow these instructions at home: Eating and drinking  Eat a diet that includes fresh fruits and vegetables, whole grains, lean protein, and low-fat dairy products. Limit your intake of foods with high amounts of sugar, saturated fats, and salt. Take vitamin and mineral supplements as recommended by your health care provider. Do not drink alcohol if your health care provider tells you not to drink. If you drink alcohol: Limit how much you have to 0-1 drink a day. Know how much alcohol is in your drink. In the [place] one drink equals one 12 oz bottle of beer (355 mL), one 5 oz glass of wine (148 mL), or one 1 oz glass of hard liquor (44 mL). Lifestyle Brush your teeth every morning and night with fluoride toothpaste. Floss one time  each day. Exercise for at least 30 minutes 5 or more days each week. Do not use any products that contain nicotine or tobacco. These products include cigarettes, chewing  tobacco, and vaping devices, such as e-cigarettes. If you need help quitting, ask your health care provider. Do not use drugs. If you are sexually active, practice safe sex. Use a condom or other form of protection in order to prevent STIs. Take aspirin only as told by your health care provider. Make sure that you understand how much to take and what form to take. Work with your health care provider to find out whether it is safe and beneficial for you to take aspirin daily. Ask your health care provider if you need to take a cholesterol-lowering medicine (statin). Find healthy ways to manage stress, such as: Meditation, yoga, or listening to music. Journaling. Talking to a trusted person. Spending time with friends and family. Minimize exposure to UV radiation to reduce your risk of skin cancer. Safety Always wear your seat belt while driving or riding in a vehicle. Do not drive: If you have been drinking alcohol. Do not ride with someone who has been drinking. When you are tired or distracted. While texting. If you have been using any mind-altering substances or drugs. Wear a helmet and other protective equipment during sports activities. If you have firearms in your house, make sure you follow all gun safety procedures. What's next? Visit your health care provider once a year for an annual wellness visit. Ask your health care provider how often you should have your eyes and teeth checked. Stay up to date on all vaccines. This information is not intended to replace advice given to you by your health care provider. Make sure you discuss any questions you have with your health care provider. Document Revised: 02/16/2021 Document Reviewed: 02/16/2021 Elsevier Patient Education  2024 [employer].

## 2023-08-22 ENCOUNTER — Other Ambulatory Visit: Payer: Self-pay | Admitting: Emergency Medicine

## 2023-08-22 DIAGNOSIS — E782 Mixed hyperlipidemia: Secondary | ICD-10-CM

## 2023-08-22 DIAGNOSIS — E559 Vitamin D deficiency, unspecified: Secondary | ICD-10-CM

## 2023-08-22 LAB — CYTOLOGY - PAP
Comment: NEGATIVE
Diagnosis: NEGATIVE
High risk HPV: NEGATIVE

## 2023-10-19 ENCOUNTER — Other Ambulatory Visit: Payer: Self-pay | Admitting: Family Medicine

## 2023-10-19 MED ORDER — TRIAMCINOLONE ACETONIDE 0.1 % EX CREA: 1.0000 | TOPICAL_CREAM | Freq: Two times a day (BID) | CUTANEOUS | 0 refills | Status: AC | PRN

## 2024-02-07 ENCOUNTER — Ambulatory Visit

## 2024-02-07 VITALS — BP 108/74 | Ht 66.0 in | Wt 158.0 lb

## 2024-02-07 DIAGNOSIS — Z0000 Encounter for general adult medical examination without abnormal findings: Secondary | ICD-10-CM

## 2024-02-07 NOTE — Patient Instructions (Signed)
 Susan Chang , Thank you for taking time out of your busy schedule to complete your Annual Wellness Visit with me. I enjoyed our conversation and look forward to speaking with you again next year. I, as well as your care team,  appreciate your ongoing commitment to your health goals. Please review the following plan we discussed and let me know if I can assist you in the future. Your Game plan/ To Do List    Referrals: If you haven't heard from the office you've been referred to, please reach out to them at the phone provided.   Follow up Visits: Next Medicare AWV with our clinical staff: 02/12/2025   Have you seen your provider in the last 6 months (3 months if uncontrolled diabetes)? Yes Next Office Visit with your provider: 08/21/2024  Clinician Recommendations:  Aim for 30 minutes of exercise or brisk walking, 6-8 glasses of water, and 5 servings of fruits and vegetables each day.       This is a list of the screening recommended for you and due dates:  Health Maintenance  Topic Date Due   Flu Shot  04/04/2024   Medicare Annual Wellness Visit  02/06/2025   DTaP/Tdap/Td vaccine (2 - Td or Tdap) 04/17/2025   Mammogram  06/17/2025   Colon Cancer Screening  10/27/2030   DEXA scan (bone density measurement)  Completed   HPV Vaccine  Aged Out   Meningitis B Vaccine  Aged Out   Pneumonia Vaccine  Discontinued   COVID-19 Vaccine  Discontinued   Hepatitis C Screening  Discontinued   Zoster (Shingles) Vaccine  Discontinued    Advanced directives: (Copy Requested) Please bring a copy of your health care power of attorney and living will to the office to be added to your chart at your convenience. You can mail to [facility] [address] [place], [place] [address] or email to [email or web address] Advance Care Planning is important because it:  [x]  Makes sure you receive the medical care that is consistent with your values, goals, and preferences  [x]  It  provides guidance to your family and loved ones and reduces their decisional burden about whether or not they are making the right decisions based on your wishes.  Follow the link provided in your after visit summary or read over the paperwork we have mailed to you to help you started getting your Advance Directives in place. If you need assistance in completing these, please reach out to us  so that we can help you!  See attachments for Preventive Care and Fall Prevention Tips.

## 2024-02-07 NOTE — Progress Notes (Signed)
 Because this visit was a virtual/telehealth visit,  certain criteria was not obtained, such a blood pressure, CBG if applicable, and timed get up and go. Any medications not marked as "taking" were not mentioned during the medication reconciliation part of the visit. Any vitals not documented were not able to be obtained due to this being a telehealth visit or patient was unable to self-report a recent blood pressure reading due to a lack of equipment at home via telehealth. Vitals that have been documented are verbally provided by the patient.   This visit was performed by a medical professional under my direct supervision. I was immediately available for consultation/collaboration. I have reviewed and agree with the Annual Wellness Visit documentation.  Subjective:   Susan Chang is a 73 y.o. who presents for a Medicare Wellness preventive visit.  As a reminder, Annual Wellness Visits don't include a physical exam, and some assessments may be limited, especially if this visit is performed virtually. We may recommend an in-person follow-up visit with your provider if needed.  Visit Complete: Virtual I connected with  Susan Chang on 02/07/24 by a audio enabled telemedicine application and verified that I am speaking with the correct person using two identifiers.  Patient Location: Home  Provider Location: Home Office  I discussed the limitations of evaluation and management by telemedicine. The patient expressed understanding and agreed to proceed.  Vital Signs: Because this visit was a virtual/telehealth visit, some criteria may be missing or patient reported. Any vitals not documented were not able to be obtained and vitals that have been documented are patient reported.  VideoDeclined- This patient declined Librarian, academic. Therefore the visit was completed with audio only.  Persons Participating in Visit: Patient.  AWV Questionnaire: No: Patient  Medicare AWV questionnaire was not completed prior to this visit.  Cardiac Risk Factors include: advanced age (>78men, >87 women);dyslipidemia     Objective:     Today's Vitals   02/07/24 1112  BP: 108/74  Weight: 158 lb (71.7 kg)  Height: 5\' 6"  (1.676 m)   Body mass index is 25.5 kg/m.     02/07/2024   11:12 AM 03/31/2021   10:47 AM 02/04/2021   10:27 AM 02/02/2021    8:05 AM 01/04/2021    6:35 AM 10/31/2020    2:46 PM 08/23/2020   10:33 AM  Advanced Directives  Does Patient Have a Medical Advance Directive? No No No No No No No  Would patient like information on creating a medical advance directive? No - Patient declined No - Patient declined No - Patient declined No - Patient declined No - Patient declined  No - Patient declined    Current Medications (verified) Outpatient Encounter Medications as of 02/07/2024  Medication Sig   acetaminophen  (TYLENOL ) 325 MG tablet Take 2 tablets (650 mg total) by mouth every 6 (six) hours as needed for mild pain or moderate pain. (Patient not taking: Reported on 02/07/2024)   triamcinolone  cream (KENALOG ) 0.1 % Apply 1 Application topically 2 (two) times daily as needed. (Patient not taking: Reported on 02/07/2024)   No facility-administered encounter medications on file as of 02/07/2024.    Allergies (verified) Pneumococcal vaccine, Shingrix [zoster vac recomb adjuvanted], and Wound dressing adhesive   History: Past Medical History:  Diagnosis Date   Breast cancer (HCC) 2000   right   Osteopenia    Preventative health care 07/13/2016   Vitamin D  deficiency 11/14/2015   Past Surgical History:  Procedure Laterality  Date   ABDOMINAL HYSTERECTOMY  09/05/1999   TAH b/l SPO for ovarian cyst,   APPENDECTOMY     BILIARY STENT PLACEMENT  02/04/2021   Procedure: BILIARY STENT PLACEMENT;  Surgeon: Evangeline Hilts, MD;  Location: [facility] ENDOSCOPY;  Service: Endoscopy;;   BREAST BIOPSY Right 09/04/1998   BREAST LUMPECTOMY Right 2000   CHOLECYSTECTOMY  N/A 01/04/2021   Procedure: LAPAROSCOPIC CHOLECYSTECTOMY WITH INTRAOPERATIVE CHOLANGIOGRAM;  Surgeon: Dareen Ebbing, MD;  Location: [facility] OR;  Service: General;  Laterality: N/A;   ERCP N/A 02/04/2021   Procedure: ENDOSCOPIC RETROGRADE CHOLANGIOPANCREATOGRAPHY (ERCP);  Surgeon: Evangeline Hilts, MD;  Location: [facility] ENDOSCOPY;  Service: Endoscopy;  Laterality: N/A;   ERCP Bilateral 03/31/2021   Procedure: ENDOSCOPIC RETROGRADE CHOLANGIOPANCREATOGRAPHY (ERCP);  Surgeon: Evangeline Hilts, MD;  Location: [facility] ENDOSCOPY;  Service: Endoscopy;  Laterality: Bilateral;   LYMPH NODE DISSECTION Right 09/04/1998   PANCREATIC STENT PLACEMENT  02/04/2021   Procedure: PANCREATIC STENT PLACEMENT;  Surgeon: Evangeline Hilts, MD;  Location: [facility] ENDOSCOPY;  Service: Endoscopy;;   REMOVAL OF STONES  03/31/2021   Procedure: REMOVAL OF STONES;  Surgeon: Evangeline Hilts, MD;  Location: [facility] ENDOSCOPY;  Service: Endoscopy;;   SPHINCTEROTOMY  02/04/2021   Procedure: [address];  Surgeon: Evangeline Hilts, MD;  Location: [facility] ENDOSCOPY;  Service: Endoscopy;;  Needle Knife   STENT REMOVAL  03/31/2021   Procedure: STENT REMOVAL;  Surgeon: Evangeline Hilts, MD;  Location: [facility] ENDOSCOPY;  Service: Endoscopy;;   Family History  Problem Relation Age of Onset   Breast cancer Mother 28   Cancer Mother        Breast cancer   Hypertension Mother    Stroke Mother    Cancer Father        lung cancer   Cancer Brother    Cancer Paternal Aunt        breast cancer   Social History   Socioeconomic History   Marital status: Married    Spouse name: Not on file   Number of children: Not on file   Years of education: Not on file   Highest education level: Not on file  Occupational History   Occupation: Naval architect  Tobacco Use   Smoking status: Never   Smokeless tobacco: Never  Vaping Use   Vaping status: Never Used  Substance and Sexual Activity   Alcohol use: Not Currently    Alcohol/week: 0.0 standard drinks of alcohol    Drug use: No   Sexual activity: Yes    Comment: lives with husband, drafting and design, no dietary restrictions  Other Topics Concern   Not on file  Social History Narrative   No major dietary restrictions but eats very little meat   Stays very physically active. Walks daily   Works Art gallery manager   Social Drivers of Corporate investment banker Strain: Low Risk  (02/07/2024)   Overall Financial Resource Strain (CARDIA)    Difficulty of Paying Living Expenses: Not hard at all  Food Insecurity: No Food Insecurity (02/07/2024)   Hunger Vital Sign    Worried About Running Out of Food in the Last Year: Never true    Ran Out of Food in the Last Year: Never true  Transportation Needs: No Transportation Needs (02/07/2024)   PRAPARE - Administrator, Civil Service (Medical): No    Lack of Transportation (Non-Medical): No  Physical Activity: Sufficiently Active (02/07/2024)   Exercise Vital Sign    Days of Exercise per Week: 5 days    Minutes of  Exercise per Session: 50 min  Stress: No Stress Concern Present (02/07/2024)   [employer] of Occupational Health - Occupational Stress Questionnaire    Feeling of Stress : Not at all  Social Connections: Socially Integrated (02/07/2024)   Social Connection and Isolation Panel [NHANES]    Frequency of Communication with Friends and Family: More than three times a week    Frequency of Social Gatherings with Friends and Family: More than three times a week    Attends Religious Services: More than 4 times per year    Active Member of [employer] or Organizations: Yes    Attends Banker Meetings: Never    Marital Status: Married    Tobacco Counseling Counseling given: Not Answered    Clinical Intake:  Pre-visit preparation completed: Yes  Pain : No/denies pain     BMI - recorded: 25.5 Nutritional Status: BMI 25 -29 Overweight Nutritional Risks: None Diabetes: No  No results found for: "HGBA1C"   How often do  you need to have someone help you when you read instructions, pamphlets, or other written materials from your doctor or pharmacy?: 1 - Never What is the last grade level you completed in school?: degree  Interpreter Needed?: No  Information entered by :: Brandonlee Navis,cma   Activities of Daily Living     02/07/2024   11:18 AM  In your present state of health, do you have any difficulty performing the following activities:  Hearing? 0  Vision? 0  Difficulty concentrating or making decisions? 0  Walking or climbing stairs? 0  Dressing or bathing? 0  Doing errands, shopping? 0  Preparing Food and eating ? N  Using the Toilet? N  In the past six months, have you accidently leaked urine? N  Do you have problems with loss of bowel control? N  Managing your Medications? N  Managing your Finances? N  Housekeeping or managing your Housekeeping? N    Patient Care Team: Neda Balk, MD as PCP - General (Family Medicine)  I have updated your Care Teams any recent Medical Services you may have received from other providers in the past year.     Assessment:    This is a routine wellness examination for Susan Chang.  Hearing/Vision screen Hearing Screening - Comments:: Patient has no hearing difficulties Vision Screening - Comments:: No vision difficulties   Goals Addressed               This Visit's Progress     Patient Stated (pt-stated)        Patient would like to stay alive       Depression Screen     02/07/2024   11:20 AM 08/21/2023    8:55 AM 05/24/2023    8:50 AM 08/21/2022    4:07 PM 08/17/2022    9:58 AM 02/02/2022    8:57 AM 08/12/2021    8:18 AM  PHQ 2/9 Scores  PHQ - 2 Score 0 0 0 0 0 0 0  PHQ- 9 Score 0  0  0  0    Fall Risk     02/07/2024   11:17 AM 08/21/2023    8:55 AM 05/24/2023    8:50 AM 08/21/2022    4:07 PM 08/17/2022    9:58 AM  Fall Risk   Falls in the past year? 0 0 0 0 0  Number falls in past yr: 0 0 0 0 0  Injury with Fall? 0 0 0 0 0   Risk  for fall due to : No Fall Risks No Fall Risks No Fall Risks No Fall Risks   Follow up Falls evaluation completed Falls evaluation completed Falls evaluation completed Falls evaluation completed Falls evaluation completed    MEDICARE RISK AT HOME:  Medicare Risk at Home Any stairs in or around the home?: Yes If so, are there any without handrails?: No Home free of loose throw rugs in walkways, pet beds, electrical cords, etc?: Yes Adequate lighting in your home to reduce risk of falls?: Yes Life alert?: No Use of a cane, walker or w/c?: No Grab bars in the bathroom?: Yes Shower chair or bench in shower?: No Elevated toilet seat or a handicapped toilet?: No  TIMED UP AND GO:  Was the test performed?  No  Cognitive Function: 6CIT completed        02/07/2024   11:14 AM  6CIT Screen  What Year? 0 points  What month? 0 points  What time? 0 points  Count back from 20 0 points  Months in reverse 0 points  Repeat phrase 0 points  Total Score 0 points    Immunizations Immunization History  Administered Date(s) Administered   Tdap 04/18/2015    Screening Tests Health Maintenance  Topic Date Due   INFLUENZA VACCINE  04/04/2024   Medicare Annual Wellness (AWV)  02/06/2025   DTaP/Tdap/Td (2 - Td or Tdap) 04/17/2025   MAMMOGRAM  06/17/2025   Colonoscopy  10/27/2030   DEXA SCAN  Completed   HPV VACCINES  Aged Out   Meningococcal B Vaccine  Aged Out   Pneumonia Vaccine 67+ Years old  Discontinued   COVID-19 Vaccine  Discontinued   Hepatitis C Screening  Discontinued   Zoster Vaccines- Shingrix  Discontinued    Health Maintenance  There are no preventive care reminders to display for this patient. Health Maintenance Items Addressed:   Additional Screening:  Vision Screening: Recommended annual ophthalmology exams for early detection of glaucoma and other disorders of the eye. Would you like a referral to an eye doctor? No    Dental Screening: Recommended  annual dental exams for proper oral hygiene  Community Resource Referral / Chronic Care Management: CRR required this visit?  No   CCM required this visit?  No   Plan:    I have personally reviewed and noted the following in the patient's chart:   Medical and social history Use of alcohol, tobacco or illicit drugs  Current medications and supplements including opioid prescriptions. Patient is not currently taking opioid prescriptions. Functional ability and status Nutritional status Physical activity Advanced directives List of other physicians Hospitalizations, surgeries, and ER visits in previous 12 months Vitals Screenings to include cognitive, depression, and falls Referrals and appointments  In addition, I have reviewed and discussed with patient certain preventive protocols, quality metrics, and best practice recommendations. A written personalized care plan for preventive services as well as general preventive health recommendations were provided to patient.   Freeda Jerry, [place]   02/07/2024   After Visit Summary: (MyChart) Due to this being a telephonic visit, the after visit summary with patients personalized plan was offered to patient via MyChart   Notes: Nothing significant to report at this time.

## 2024-02-20 ENCOUNTER — Other Ambulatory Visit (INDEPENDENT_AMBULATORY_CARE_PROVIDER_SITE_OTHER): Payer: Medicare HMO

## 2024-02-20 ENCOUNTER — Ambulatory Visit: Payer: Self-pay | Admitting: Family Medicine

## 2024-02-20 DIAGNOSIS — E782 Mixed hyperlipidemia: Secondary | ICD-10-CM

## 2024-02-20 DIAGNOSIS — E559 Vitamin D deficiency, unspecified: Secondary | ICD-10-CM

## 2024-02-20 LAB — CBC WITH DIFFERENTIAL/PLATELET
Basophils Absolute: 0.1 10*3/uL (ref 0.0–0.1)
Basophils Relative: 1.4 % (ref 0.0–3.0)
Eosinophils Absolute: 0.1 10*3/uL (ref 0.0–0.7)
Eosinophils Relative: 1.2 % (ref 0.0–5.0)
HCT: 39.1 % (ref 36.0–46.0)
Hemoglobin: 12.9 g/dL (ref 12.0–15.0)
Lymphocytes Relative: 33.8 % (ref 12.0–46.0)
Lymphs Abs: 1.7 10*3/uL (ref 0.7–4.0)
MCHC: 32.9 g/dL (ref 30.0–36.0)
MCV: 88.9 fl (ref 78.0–100.0)
Monocytes Absolute: 0.5 10*3/uL (ref 0.1–1.0)
Monocytes Relative: 9.1 % (ref 3.0–12.0)
Neutro Abs: 2.8 10*3/uL (ref 1.4–7.7)
Neutrophils Relative %: 54.5 % (ref 43.0–77.0)
Platelets: 315 10*3/uL (ref 150.0–400.0)
RBC: 4.4 Mil/uL (ref 3.87–5.11)
RDW: 13.8 % (ref 11.5–15.5)
WBC: 5.1 10*3/uL (ref 4.0–10.5)

## 2024-02-20 LAB — LIPID PANEL
Cholesterol: 228 mg/dL — ABNORMAL HIGH (ref 0–200)
HDL: 59.2 mg/dL (ref >39.00)
LDL Cholesterol: 149 mg/dL — ABNORMAL HIGH (ref 0–99)
NonHDL: 168.75
Total CHOL/HDL Ratio: 4
Triglycerides: 98 mg/dL (ref 0.0–149.0)
VLDL: 19.6 mg/dL (ref 0.0–40.0)

## 2024-02-20 LAB — VITAMIN D 25 HYDROXY (VIT D DEFICIENCY, FRACTURES): VITD: 18.05 ng/mL — ABNORMAL LOW (ref 30.00–100.00)

## 2024-02-20 LAB — TSH: TSH: 1.09 u[IU]/mL (ref 0.35–5.50)

## 2024-02-20 LAB — COMPREHENSIVE METABOLIC PANEL WITH GFR
ALT: 15 U/L (ref 0–35)
AST: 16 U/L (ref 0–37)
Albumin: 4.5 g/dL (ref 3.5–5.2)
Alkaline Phosphatase: 62 U/L (ref 39–117)
BUN: 14 mg/dL (ref 6–23)
CO2: 30 meq/L (ref 19–32)
Calcium: 9.6 mg/dL (ref 8.4–10.5)
Chloride: 103 meq/L (ref 96–112)
Creatinine, Ser: 0.82 mg/dL (ref 0.40–1.20)
GFR: 72.22 mL/min (ref >60.00)
Glucose, Bld: 94 mg/dL (ref 70–99)
Potassium: 4.6 meq/L (ref 3.5–5.1)
Sodium: 138 meq/L (ref 135–145)
Total Bilirubin: 0.8 mg/dL (ref 0.2–1.2)
Total Protein: 7.4 g/dL (ref 6.0–8.3)

## 2024-02-21 MED ORDER — VITAMIN D (ERGOCALCIFEROL) 1.25 MG (50000 UNIT) PO CAPS: 50000.0000 [IU] | ORAL_CAPSULE | ORAL | 4 refills | Status: DC

## 2024-05-06 ENCOUNTER — Other Ambulatory Visit (HOSPITAL_BASED_OUTPATIENT_CLINIC_OR_DEPARTMENT_OTHER): Payer: Self-pay | Admitting: Family Medicine

## 2024-05-06 DIAGNOSIS — Z1231 Encounter for screening mammogram for malignant neoplasm of breast: Secondary | ICD-10-CM

## 2024-06-23 ENCOUNTER — Ambulatory Visit (HOSPITAL_BASED_OUTPATIENT_CLINIC_OR_DEPARTMENT_OTHER)
Admission: RE | Admit: 2024-06-23 | Discharge: 2024-06-23 | Disposition: A | Discharge status: Home / Self Care | Source: Ambulatory Visit | Attending: Family Medicine | Admitting: Family Medicine

## 2024-06-23 ENCOUNTER — Encounter (HOSPITAL_BASED_OUTPATIENT_CLINIC_OR_DEPARTMENT_OTHER): Payer: Self-pay

## 2024-06-23 DIAGNOSIS — Z1231 Encounter for screening mammogram for malignant neoplasm of breast: Secondary | ICD-10-CM | POA: Diagnosis not present

## 2024-07-11 ENCOUNTER — Other Ambulatory Visit: Payer: Self-pay | Admitting: Family Medicine

## 2024-07-11 DIAGNOSIS — E559 Vitamin D deficiency, unspecified: Secondary | ICD-10-CM

## 2024-08-06 ENCOUNTER — Other Ambulatory Visit: Payer: Self-pay | Admitting: Family

## 2024-08-06 DIAGNOSIS — E559 Vitamin D deficiency, unspecified: Secondary | ICD-10-CM

## 2024-08-17 NOTE — Progress Notes (Unsigned)
 Subjective:    Patient ID: Susan Chang, female    DOB: [birth year], 73 y.o.   MRN: [id number]  No chief complaint on file.   HPI Discussed the use of AI scribe software for clinical note transcription with the patient, who gave verbal consent to proceed.  History of Present Illness     Past Medical History:  Diagnosis Date   Breast cancer (HCC) 2000   right   Osteopenia    Preventative health care 07/13/2016   Vitamin D  deficiency 11/14/2015    Past Surgical History:  Procedure Laterality Date   ABDOMINAL HYSTERECTOMY  09/05/1999   TAH b/l SPO for ovarian cyst,   APPENDECTOMY     BILIARY STENT PLACEMENT  02/04/2021   Procedure: BILIARY STENT PLACEMENT;  Surgeon: Burnette Fallow, MD;  Location: [facility] ENDOSCOPY;  Service: Endoscopy;;   BREAST BIOPSY Right 09/04/1998   BREAST LUMPECTOMY Right 2000   CHOLECYSTECTOMY N/A 01/04/2021   Procedure: LAPAROSCOPIC CHOLECYSTECTOMY WITH INTRAOPERATIVE CHOLANGIOGRAM;  Surgeon: Belinda Cough, MD;  Location: [facility] OR;  Service: General;  Laterality: N/A;   ERCP N/A 02/04/2021   Procedure: ENDOSCOPIC RETROGRADE CHOLANGIOPANCREATOGRAPHY (ERCP);  Surgeon: Burnette Fallow, MD;  Location: [facility] ENDOSCOPY;  Service: Endoscopy;  Laterality: N/A;   ERCP Bilateral 03/31/2021   Procedure: ENDOSCOPIC RETROGRADE CHOLANGIOPANCREATOGRAPHY (ERCP);  Surgeon: Burnette Fallow, MD;  Location: [facility];  Service: Endoscopy;  Laterality: Bilateral;   LYMPH NODE DISSECTION Right 09/04/1998   PANCREATIC STENT PLACEMENT  02/04/2021   Procedure: PANCREATIC STENT PLACEMENT;  Surgeon: Burnette Fallow, MD;  Location: [facility] ENDOSCOPY;  Service: Endoscopy;;   REMOVAL OF STONES  03/31/2021   Procedure: REMOVAL OF STONES;  Surgeon: Burnette Fallow, MD;  Location: [facility] ENDOSCOPY;  Service: Endoscopy;;   SPHINCTEROTOMY  02/04/2021   Procedure: [address];  Surgeon: Burnette Fallow, MD;  Location: [facility] ENDOSCOPY;  Service: Endoscopy;;  Needle Knife   STENT  REMOVAL  03/31/2021   Procedure: STENT REMOVAL;  Surgeon: Burnette Fallow, MD;  Location: [facility] ENDOSCOPY;  Service: Endoscopy;;    Family History  Problem Relation Age of Onset   Breast cancer Mother 65   Cancer Mother        Breast cancer   Hypertension Mother    Stroke Mother    Cancer Father        lung cancer   Cancer Brother    Cancer Paternal Aunt        breast cancer    Social History   Socioeconomic History   Marital status: Married    Spouse name: Not on file   Number of children: Not on file   Years of education: Not on file   Highest education level: Not on file  Occupational History   Occupation: naval architect  Tobacco Use   Smoking status: Never   Smokeless tobacco: Never  Vaping Use   Vaping status: Never Used  Substance and Sexual Activity   Alcohol use: Not Currently    Alcohol/week: 0.0 standard drinks of alcohol   Drug use: No   Sexual activity: Yes    Comment: lives with husband, drafting and design, no dietary restrictions  Other Topics Concern   Not on file  Social History Narrative   No major dietary restrictions but eats very little meat   Stays very physically active. Walks daily   Works art gallery manager   Social Drivers of Health   Tobacco Use: Low Risk (02/07/2024)   Patient History    Smoking Tobacco Use: Never  Smokeless Tobacco Use: Never    Passive Exposure: Not on file  Financial Resource Strain: Low Risk (02/07/2024)   Overall Financial Resource Strain (CARDIA)    Difficulty of Paying Living Expenses: Not hard at all  Food Insecurity: No Food Insecurity (02/07/2024)   Hunger Vital Sign    Worried About Running Out of Food in the Last Year: Never true    Ran Out of Food in the Last Year: Never true  Transportation Needs: No Transportation Needs (02/07/2024)   PRAPARE - Administrator, Civil Service (Medical): No    Lack of Transportation (Non-Medical): No  Physical Activity:  Sufficiently Active (02/07/2024)   Exercise Vital Sign    Days of Exercise per Week: 5 days    Minutes of Exercise per Session: 50 min  Stress: No Stress Concern Present (02/07/2024)   [employer] of Occupational Health - Occupational Stress Questionnaire    Feeling of Stress : Not at all  Social Connections: Socially Integrated (02/07/2024)   Social Connection and Isolation Panel    Frequency of Communication with Friends and Family: More than three times a week    Frequency of Social Gatherings with Friends and Family: More than three times a week    Attends Religious Services: More than 4 times per year    Active Member of [employer] or Organizations: Yes    Attends Banker Meetings: Never    Marital Status: Married  Catering Manager Violence: Not At Risk (02/07/2024)   Humiliation, Afraid, Rape, and Kick questionnaire    Fear of Current or Ex-Partner: No    Emotionally Abused: No    Physically Abused: No    Sexually Abused: No  Depression (PHQ2-9): Low Risk (02/07/2024)   Depression (PHQ2-9)    PHQ-2 Score: 0  Alcohol Screen: Low Risk (02/07/2024)   Alcohol Screen    Last Alcohol Screening Score (AUDIT): 0  Housing: Low Risk (02/07/2024)   Housing Stability Vital Sign    Unable to Pay for Housing in the Last Year: No    Number of Times Moved in the Last Year: 0    Homeless in the Last Year: No  Utilities: Not At Risk (02/07/2024)   AHC Utilities    Threatened with loss of utilities: No  Health Literacy: Adequate Health Literacy (02/07/2024)   B1300 Health Literacy    Frequency of need for help with medical instructions: Never    Outpatient Medications Prior to Visit  Medication Sig Dispense Refill   acetaminophen  (TYLENOL ) 325 MG tablet Take 2 tablets (650 mg total) by mouth every 6 (six) hours as needed for mild pain or moderate pain. (Patient not taking: Reported on 02/07/2024)     triamcinolone  cream (KENALOG ) 0.1 % Apply 1 Application topically 2  (two) times daily as needed. (Patient not taking: Reported on 02/07/2024) 80 g 0   Vitamin D , Ergocalciferol , (DRISDOL ) 1.25 MG (50000 UNIT) CAPS capsule Take 1 capsule by mouth once a week 4 capsule 0   No facility-administered medications prior to visit.    Allergies[1]  Review of Systems  Constitutional:  Negative for fever and malaise/fatigue.  HENT:  Negative for congestion.   Eyes:  Negative for blurred vision.  Respiratory:  Negative for shortness of breath.   Cardiovascular:  Negative for chest pain, palpitations and leg swelling.  Gastrointestinal:  Negative for abdominal pain, blood in stool and nausea.  Genitourinary:  Negative for dysuria and frequency.  Musculoskeletal:  Negative for falls.  Skin:  Negative for rash.  Neurological:  Negative for dizziness, loss of consciousness and headaches.  Endo/Heme/Allergies:  Negative for environmental allergies.  Psychiatric/Behavioral:  Negative for depression. The patient is not nervous/anxious.        Objective:    Physical Exam Constitutional:      General: She is not in acute distress.    Appearance: Normal appearance. She is not diaphoretic.  HENT:     Head: Normocephalic and atraumatic.     Right Ear: Tympanic membrane, ear canal and external ear normal.     Left Ear: Tympanic membrane, ear canal and external ear normal.     Nose: Nose normal.     Mouth/Throat:     Mouth: Mucous membranes are moist.     Pharynx: Oropharynx is clear. No oropharyngeal exudate.  Eyes:     General: No scleral icterus.       Right eye: No discharge.        Left eye: No discharge.     Conjunctiva/sclera: Conjunctivae normal.     Pupils: Pupils are equal, round, and reactive to light.  Neck:     Thyroid : No thyromegaly.  Cardiovascular:     Rate and Rhythm: Normal rate and regular rhythm.     Heart sounds: Normal heart sounds. No murmur heard. Pulmonary:     Effort: Pulmonary effort is normal. No respiratory distress.     Breath  sounds: Normal breath sounds. No wheezing or rales.  Abdominal:     General: Bowel sounds are normal. There is no distension.     Palpations: Abdomen is soft. There is no mass.     Tenderness: There is no abdominal tenderness.  Musculoskeletal:        General: No tenderness. Normal range of motion.     Cervical back: Normal range of motion and neck supple.  Lymphadenopathy:     Cervical: No cervical adenopathy.  Skin:    General: Skin is warm and dry.     Findings: No rash.  Neurological:     General: No focal deficit present.     Mental Status: She is alert and oriented to person, place, and time.     Cranial Nerves: No cranial nerve deficit.     Coordination: Coordination normal.     Deep Tendon Reflexes: Reflexes are normal and symmetric. Reflexes normal.  Psychiatric:        Mood and Affect: Mood normal.        Behavior: Behavior normal.        Thought Content: Thought content normal.        Judgment: Judgment normal.    There were no vitals taken for this visit. Wt Readings from Last 3 Encounters:  02/07/24 158 lb (71.7 kg)  08/21/23 162 lb 6.4 oz (73.7 kg)  05/24/23 158 lb (71.7 kg)    Diabetic Foot Exam - Simple   No data filed    Lab Results  Component Value Date   WBC 5.1 02/20/2024   HGB 12.9 02/20/2024   HCT 39.1 02/20/2024   PLT 315.0 02/20/2024   GLUCOSE 94 02/20/2024   CHOL 228 (H) 02/20/2024   TRIG 98.0 02/20/2024   HDL 59.20 02/20/2024   LDLCALC 149 (H) 02/20/2024   ALT 15 02/20/2024   AST 16 02/20/2024   NA 138 02/20/2024   K 4.6 02/20/2024   CL 103 02/20/2024   CREATININE 0.82 02/20/2024   BUN 14 02/20/2024   CO2 30 02/20/2024   TSH 1.09 02/20/2024  Lab Results  Component Value Date   TSH 1.09 02/20/2024   Lab Results  Component Value Date   WBC 5.1 02/20/2024   HGB 12.9 02/20/2024   HCT 39.1 02/20/2024   MCV 88.9 02/20/2024   PLT 315.0 02/20/2024   Lab Results  Component Value Date   NA 138 02/20/2024   K 4.6 02/20/2024    CO2 30 02/20/2024   GLUCOSE 94 02/20/2024   BUN 14 02/20/2024   CREATININE 0.82 02/20/2024   BILITOT 0.8 02/20/2024   ALKPHOS 62 02/20/2024   AST 16 02/20/2024   ALT 15 02/20/2024   PROT 7.4 02/20/2024   ALBUMIN 4.5 02/20/2024   CALCIUM 9.6 02/20/2024   ANIONGAP 11 02/05/2021   GFR 72.22 02/20/2024   Lab Results  Component Value Date   CHOL 228 (H) 02/20/2024   Lab Results  Component Value Date   HDL 59.20 02/20/2024   Lab Results  Component Value Date   LDLCALC 149 (H) 02/20/2024   Lab Results  Component Value Date   TRIG 98.0 02/20/2024   Lab Results  Component Value Date   CHOLHDL 4 02/20/2024   No results found for: HGBA1C     Assessment & Plan:  Vitamin D  deficiency Assessment & Plan: Supplement and monitor   Preventative health care Assessment & Plan: Patient encouraged to maintain heart healthy diet, regular exercise, adequate sleep. Consider daily probiotics. Take medications as prescribed. Labs ordered and reviewed  Colonoscopy patient 2022 with polyps repeat in 2027 or so Pap 08/2023 Mgm 06/2024 repeat annually Dexa 06/2022 repeat in 2025 or 2026 Given and reviewed copy of ACP documents from [employer] and encouraged to complete and return      Assessment and Plan Assessment & Plan      Harlene Horton, MD     [1] Allergies Allergen Reactions   Pneumococcal Vaccine Other (See Comments)   Shingrix [Zoster Vac Recomb Adjuvanted] Other (See Comments)   Wound Dressing Adhesive Other (See Comments)    Clear plastic tape caused rash

## 2024-08-17 NOTE — Assessment & Plan Note (Signed)
 Supplement and monitor

## 2024-08-17 NOTE — Assessment & Plan Note (Addendum)
 Patient encouraged to maintain heart healthy diet, regular exercise, adequate sleep. Consider daily probiotics. Take medications as prescribed. Labs ordered and reviewed  Colonoscopy patient 2022 with polyps repeat in 2027 or so Pap 08/2023 Mgm 06/2024 repeat annually Dexa 06/2022 repeat in 2025 or 2026 Given and reviewed copy of ACP documents from [employer] and encouraged to complete and return

## 2024-08-21 ENCOUNTER — Ambulatory Visit: Payer: Medicare HMO | Admitting: Family Medicine

## 2024-08-21 ENCOUNTER — Encounter: Payer: Self-pay | Admitting: Family Medicine

## 2024-08-21 VITALS — BP 126/78 | HR 70 | Temp 98.0°F | Resp 16 | Ht 66.0 in | Wt 171.8 lb

## 2024-08-21 DIAGNOSIS — W57XXXA Bitten or stung by nonvenomous insect and other nonvenomous arthropods, initial encounter: Secondary | ICD-10-CM

## 2024-08-21 DIAGNOSIS — Z780 Asymptomatic menopausal state: Secondary | ICD-10-CM | POA: Diagnosis not present

## 2024-08-21 DIAGNOSIS — E2839 Other primary ovarian failure: Secondary | ICD-10-CM | POA: Diagnosis not present

## 2024-08-21 DIAGNOSIS — M8580 Other specified disorders of bone density and structure, unspecified site: Secondary | ICD-10-CM

## 2024-08-21 DIAGNOSIS — S50361A Insect bite (nonvenomous) of right elbow, initial encounter: Secondary | ICD-10-CM | POA: Diagnosis not present

## 2024-08-21 DIAGNOSIS — E782 Mixed hyperlipidemia: Secondary | ICD-10-CM

## 2024-08-21 DIAGNOSIS — Z0000 Encounter for general adult medical examination without abnormal findings: Secondary | ICD-10-CM

## 2024-08-21 DIAGNOSIS — E559 Vitamin D deficiency, unspecified: Secondary | ICD-10-CM | POA: Diagnosis not present

## 2024-08-21 LAB — CBC WITH DIFFERENTIAL/PLATELET
Basophils Absolute: 0.1 K/uL (ref 0.0–0.1)
Basophils Relative: 1.1 % (ref 0.0–3.0)
Eosinophils Absolute: 0 K/uL (ref 0.0–0.7)
Eosinophils Relative: 0.7 % (ref 0.0–5.0)
HCT: 39.8 % (ref 36.0–46.0)
Hemoglobin: 13.3 g/dL (ref 12.0–15.0)
Lymphocytes Relative: 35.1 % (ref 12.0–46.0)
Lymphs Abs: 1.8 K/uL (ref 0.7–4.0)
MCHC: 33.4 g/dL (ref 30.0–36.0)
MCV: 89.1 fl (ref 78.0–100.0)
Monocytes Absolute: 0.4 K/uL (ref 0.1–1.0)
Monocytes Relative: 7.6 % (ref 3.0–12.0)
Neutro Abs: 2.9 K/uL (ref 1.4–7.7)
Neutrophils Relative %: 55.5 % (ref 43.0–77.0)
Platelets: 329 K/uL (ref 150.0–400.0)
RBC: 4.46 Mil/uL (ref 3.87–5.11)
RDW: 13.8 % (ref 11.5–15.5)
WBC: 5.2 K/uL (ref 4.0–10.5)

## 2024-08-21 LAB — COMPREHENSIVE METABOLIC PANEL WITH GFR
ALT: 17 U/L (ref 3–35)
AST: 20 U/L (ref 5–37)
Albumin: 4.6 g/dL (ref 3.5–5.2)
Alkaline Phosphatase: 69 U/L (ref 39–117)
BUN: 15 mg/dL (ref 6–23)
CO2: 29 meq/L (ref 19–32)
Calcium: 9.6 mg/dL (ref 8.4–10.5)
Chloride: 102 meq/L (ref 96–112)
Creatinine, Ser: 0.86 mg/dL (ref 0.40–1.20)
GFR: 67.97 mL/min (ref >60.00)
Glucose, Bld: 96 mg/dL (ref 70–99)
Potassium: 4.4 meq/L (ref 3.5–5.1)
Sodium: 139 meq/L (ref 135–145)
Total Bilirubin: 0.6 mg/dL (ref 0.2–1.2)
Total Protein: 7.6 g/dL (ref 6.0–8.3)

## 2024-08-21 LAB — LIPID PANEL
Cholesterol: 257 mg/dL — ABNORMAL HIGH (ref 28–200)
HDL: 68.1 mg/dL (ref >39.00)
LDL Cholesterol: 168 mg/dL — ABNORMAL HIGH (ref 10–99)
NonHDL: 188.86
Total CHOL/HDL Ratio: 4
Triglycerides: 106 mg/dL (ref 10.0–149.0)
VLDL: 21.2 mg/dL (ref 0.0–40.0)

## 2024-08-21 LAB — VITAMIN D 25 HYDROXY (VIT D DEFICIENCY, FRACTURES): VITD: 23.3 ng/mL — ABNORMAL LOW (ref 30.00–100.00)

## 2024-08-21 LAB — TSH: TSH: 0.95 u[IU]/mL (ref 0.35–5.50)

## 2024-08-21 NOTE — Patient Instructions (Addendum)
 CBT[facility] written by the [facility]  Annual flu and covid RSV, Respiratory Syncitial Virus Vaccine, Arexvy Prevnar 20 Tetanus in 2026 after 8/14 or sooner if injured Shingrix is the new shingles shot, 2 shots over 2-6 months, confirm coverage with insurance and document, then can return here for shots with nurse appt or at pharmacy  [facility] now [facility] clinics M-F 9-4  [employer] company makes all supplements and they make MVI and minerals, fish oil and vitamin D  and all supplements at [email or web address], [employer] Preventive Care 65 Years and Older, Female Preventive care refers to lifestyle choices and visits with your health care provider that can promote health and wellness. Preventive care visits are also called wellness exams. What can I expect for my preventive care visit? Counseling Your health care provider may ask you questions about your: Medical history, including: Past medical problems. Family medical history. Pregnancy and menstrual history. History of falls. Current health, including: Memory and ability to understand (cognition). Emotional well-being. Home life and relationship well-being. Sexual activity and sexual health. Lifestyle, including: Alcohol, nicotine or tobacco, and drug use. Access to firearms. Diet, exercise, and sleep habits. Work and work astronomer. Sunscreen use. Safety issues such as seatbelt and bike helmet use. Physical exam Your health care provider will check your: Height and weight. These may be used to calculate your BMI (body mass index). BMI is a measurement that tells if you are at a healthy weight. Waist circumference. This measures the distance around your waistline. This measurement also tells if you are at a healthy weight and may help predict your risk of certain diseases, such as type 2 diabetes and high blood pressure. Heart rate and blood pressure. Body temperature. Skin for abnormal spots. What immunizations do I  need?  Vaccines are usually given at various ages, according to a schedule. Your health care provider will recommend vaccines for you based on your age, medical history, and lifestyle or other factors, such as travel or where you work. What tests do I need? Screening Your health care provider may recommend screening tests for certain conditions. This may include: Lipid and cholesterol levels. Hepatitis C test. Hepatitis B test. HIV (human immunodeficiency virus) test. STI (sexually transmitted infection) testing, if you are at risk. Lung cancer screening. Colorectal cancer screening. Diabetes screening. This is done by checking your blood sugar (glucose) after you have not eaten for a while (fasting). Mammogram. Talk with your health care provider about how often you should have regular mammograms. BRCA-related cancer screening. This may be done if you have a family history of breast, ovarian, tubal, or peritoneal cancers. Bone density scan. This is done to screen for osteoporosis. Talk with your health care provider about your test results, treatment options, and if necessary, the need for more tests. Follow these instructions at home: Eating and drinking  Eat a diet that includes fresh fruits and vegetables, whole grains, lean protein, and low-fat dairy products. Limit your intake of foods with high amounts of sugar, saturated fats, and salt. Take vitamin and mineral supplements as recommended by your health care provider. Do not drink alcohol if your health care provider tells you not to drink. If you drink alcohol: Limit how much you have to 0-1 drink a day. Know how much alcohol is in your drink. In the [place] one drink equals one 12 oz bottle of beer (355 mL), one 5 oz glass of wine (148 mL), or one 1 oz glass of hard liquor (44 mL).  Lifestyle Brush your teeth every morning and night with fluoride toothpaste. Floss one time each day. Exercise for at least 30 minutes 5 or more days  each week. Do not use any products that contain nicotine or tobacco. These products include cigarettes, chewing tobacco, and vaping devices, such as e-cigarettes. If you need help quitting, ask your health care provider. Do not use drugs. If you are sexually active, practice safe sex. Use a condom or other form of protection in order to prevent STIs. Take aspirin only as told by your health care provider. Make sure that you understand how much to take and what form to take. Work with your health care provider to find out whether it is safe and beneficial for you to take aspirin daily. Ask your health care provider if you need to take a cholesterol-lowering medicine (statin). Find healthy ways to manage stress, such as: Meditation, yoga, or listening to music. Journaling. Talking to a trusted person. Spending time with friends and family. Minimize exposure to UV radiation to reduce your risk of skin cancer. Safety Always wear your seat belt while driving or riding in a vehicle. Do not drive: If you have been drinking alcohol. Do not ride with someone who has been drinking. When you are tired or distracted. While texting. If you have been using any mind-altering substances or drugs. Wear a helmet and other protective equipment during sports activities. If you have firearms in your house, make sure you follow all gun safety procedures. What's next? Visit your health care provider once a year for an annual wellness visit. Ask your health care provider how often you should have your eyes and teeth checked. Stay up to date on all vaccines. This information is not intended to replace advice given to you by your health care provider. Make sure you discuss any questions you have with your health care provider. Document Revised: 02/16/2021 Document Reviewed: 02/16/2021 Elsevier Patient Education  2024 [employer].

## 2024-08-21 NOTE — Assessment & Plan Note (Signed)
 Bone density shows osteopenia, which is thinner than normal but not as bad as osteoporosis. Recommend calcium intake of 1200 to 1500 mg daily, divided into roughly 3 doses. Best source is the diet and a single dairy serving is about 500 mg, a supplement of calcium citrate once or twice daily to balance diet is fine if not getting enough in diet. Also need Vitamin D  2000 IU caps, 1 cap daily if not already taking vitamin D . Also recommend weight baring exercise on hips and upper body to keep bones strong repeat Dexa

## 2024-08-21 NOTE — Assessment & Plan Note (Signed)
 Check Ehrlichia, lyme and RMSF since the lesion has become irritated again

## 2024-08-22 ENCOUNTER — Ambulatory Visit: Payer: Self-pay | Admitting: Family Medicine

## 2024-08-23 LAB — SPOTTED FEVER GROUP ANTIBODIES
Spotted Fever Group IgG: <1:64 {titer}
Spotted Fever Group IgM: <1:64 {titer}

## 2024-08-25 LAB — B. BURGDORFI ANTIBODIES: B burgdorferi Ab IgG+IgM: <0.9 {index}

## 2024-08-25 LAB — EHRLICHIA ANTIBODY PANEL
E. CHAFFEENSIS AB IGG: <1:64 {titer}
E. CHAFFEENSIS AB IGM: <1:20 {titer}

## 2024-09-08 ENCOUNTER — Other Ambulatory Visit: Payer: Self-pay | Admitting: Family

## 2024-09-08 DIAGNOSIS — E559 Vitamin D deficiency, unspecified: Secondary | ICD-10-CM

## 2024-10-10 ENCOUNTER — Other Ambulatory Visit: Payer: Self-pay | Admitting: Family Medicine

## 2024-10-10 DIAGNOSIS — E559 Vitamin D deficiency, unspecified: Secondary | ICD-10-CM

## 2024-10-22 ENCOUNTER — Encounter: Admitting: Physician Assistant

## 2024-11-04 ENCOUNTER — Other Ambulatory Visit (HOSPITAL_BASED_OUTPATIENT_CLINIC_OR_DEPARTMENT_OTHER)

## 2025-02-12 ENCOUNTER — Ambulatory Visit

## 2025-02-19 ENCOUNTER — Ambulatory Visit: Admitting: Family Medicine

## 2025-08-24 ENCOUNTER — Encounter: Admitting: Family Medicine
